# Patient Record
Sex: Male | Born: 1985 | Race: White | Hispanic: No | Marital: Single | Smoking: Former smoker
Health system: Southern US, Community
[De-identification: ages and names within clinical notes are randomized; demographics above are authoritative.]

## PROBLEM LIST (undated history)

## (undated) ENCOUNTER — Emergency Department: Discharge status: Absent without leave

## (undated) DIAGNOSIS — R569 Unspecified convulsions: Secondary | ICD-10-CM

## (undated) DIAGNOSIS — S069X9A Unspecified intracranial injury with loss of consciousness of unspecified duration, initial encounter: Secondary | ICD-10-CM

## (undated) DIAGNOSIS — J45909 Unspecified asthma, uncomplicated: Secondary | ICD-10-CM

## (undated) DIAGNOSIS — S069XAA Unspecified intracranial injury with loss of consciousness status unknown, initial encounter: Secondary | ICD-10-CM

## (undated) DIAGNOSIS — Z5189 Encounter for other specified aftercare: Secondary | ICD-10-CM

## (undated) DIAGNOSIS — T7840XA Allergy, unspecified, initial encounter: Secondary | ICD-10-CM

## (undated) DIAGNOSIS — F840 Autistic disorder: Secondary | ICD-10-CM

## (undated) HISTORY — DX: Allergy, unspecified, initial encounter: T78.40XA

## (undated) HISTORY — PX: ANKLE SURGERY: SHX546

## (undated) HISTORY — DX: Unspecified asthma, uncomplicated: J45.909

## (undated) HISTORY — PX: APPENDECTOMY: SHX54

## (undated) HISTORY — PX: LUNG SURGERY: SHX703

## (undated) HISTORY — PX: BRAIN SURGERY: SHX531

## (undated) HISTORY — DX: Encounter for other specified aftercare: Z51.89

---

## 2020-04-07 ENCOUNTER — Emergency Department
Admission: EM | Admit: 2020-04-07 | Discharge: 2020-04-07 | Disposition: A | Discharge status: Home / Self Care | Payer: Medicare Other | Attending: Student in an Organized Health Care Education/Training Program | Admitting: Student in an Organized Health Care Education/Training Program

## 2020-04-07 ENCOUNTER — Encounter: Payer: Self-pay | Admitting: Emergency Medicine

## 2020-04-07 ENCOUNTER — Emergency Department: Payer: Medicare Other

## 2020-04-07 ENCOUNTER — Other Ambulatory Visit: Payer: Self-pay

## 2020-04-07 DIAGNOSIS — F17200 Nicotine dependence, unspecified, uncomplicated: Secondary | ICD-10-CM | POA: Diagnosis not present

## 2020-04-07 DIAGNOSIS — Y92199 Unspecified place in other specified residential institution as the place of occurrence of the external cause: Secondary | ICD-10-CM | POA: Diagnosis not present

## 2020-04-07 DIAGNOSIS — Y9389 Activity, other specified: Secondary | ICD-10-CM | POA: Diagnosis not present

## 2020-04-07 DIAGNOSIS — S0990XA Unspecified injury of head, initial encounter: Secondary | ICD-10-CM | POA: Insufficient documentation

## 2020-04-07 DIAGNOSIS — W228XXA Striking against or struck by other objects, initial encounter: Secondary | ICD-10-CM | POA: Insufficient documentation

## 2020-04-07 DIAGNOSIS — R569 Unspecified convulsions: Secondary | ICD-10-CM | POA: Insufficient documentation

## 2020-04-07 HISTORY — DX: Unspecified intracranial injury with loss of consciousness status unknown, initial encounter: S06.9XAA

## 2020-04-07 HISTORY — DX: Unspecified intracranial injury with loss of consciousness of unspecified duration, initial encounter: S06.9X9A

## 2020-04-07 HISTORY — DX: Unspecified convulsions: R56.9

## 2020-04-07 LAB — CBG MONITORING, ED: Glucose-Capillary: 111 mg/dL — ABNORMAL HIGH (ref 70–99)

## 2020-04-07 MED ORDER — DIVALPROEX SODIUM 500 MG PO DR TAB
750.0000 mg | DELAYED_RELEASE_TABLET | Freq: Once | ORAL | Status: AC
  Administered 2020-04-07: 750 mg via ORAL
  Filled 2020-04-07: qty 1
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
## 2020-04-07 NOTE — ED Triage Notes (Signed)
Patient brought in by ems from a group home. Patient became angry and then unresponsive. Per ems when they arrived the patient was having eye movement, jaw clinching and unresponsive. Patient has a history of seizures. Patient was given 2 mg of Versed IV by ems at 21:47.
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
## 2020-04-18 ENCOUNTER — Emergency Department
Admission: EM | Admit: 2020-04-18 | Discharge: 2020-04-18 | Disposition: A | Discharge status: Home / Self Care | Payer: Medicare Other | Attending: Emergency Medicine | Admitting: Emergency Medicine

## 2020-04-18 ENCOUNTER — Other Ambulatory Visit: Payer: Self-pay

## 2020-04-18 DIAGNOSIS — R531 Weakness: Secondary | ICD-10-CM | POA: Insufficient documentation

## 2020-04-18 DIAGNOSIS — R519 Headache, unspecified: Secondary | ICD-10-CM | POA: Diagnosis not present

## 2020-04-18 DIAGNOSIS — Z5321 Procedure and treatment not carried out due to patient leaving prior to being seen by health care provider: Secondary | ICD-10-CM | POA: Insufficient documentation

## 2020-04-18 LAB — CBC
HCT: 42.4 % (ref 39.0–52.0)
Hemoglobin: 14.5 g/dL (ref 13.0–17.0)
MCH: 32.2 pg (ref 26.0–34.0)
MCHC: 34.2 g/dL (ref 30.0–36.0)
MCV: 94.2 fL (ref 80.0–100.0)
Platelets: 333 10*3/uL (ref 150–400)
RBC: 4.5 MIL/uL (ref 4.22–5.81)
RDW: 12.9 % (ref 11.5–15.5)
WBC: 8.5 10*3/uL (ref 4.0–10.5)
nRBC: 0 % (ref 0.0–0.2)

## 2020-04-18 LAB — BASIC METABOLIC PANEL
Anion gap: 11 (ref 5–15)
BUN: 13 mg/dL (ref 6–20)
CO2: 24 mmol/L (ref 22–32)
Calcium: 9.4 mg/dL (ref 8.9–10.3)
Chloride: 105 mmol/L (ref 98–111)
Creatinine, Ser: 0.83 mg/dL (ref 0.61–1.24)
GFR, Estimated: >60 mL/min (ref >60)
Glucose, Bld: 90 mg/dL (ref 70–99)
Potassium: 4.1 mmol/L (ref 3.5–5.1)
Sodium: 140 mmol/L (ref 135–145)
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
## 2020-06-05 ENCOUNTER — Emergency Department: Payer: Medicare Other

## 2020-06-05 ENCOUNTER — Other Ambulatory Visit: Payer: Self-pay

## 2020-06-05 DIAGNOSIS — Y9389 Activity, other specified: Secondary | ICD-10-CM | POA: Insufficient documentation

## 2020-06-05 DIAGNOSIS — Y9289 Other specified places as the place of occurrence of the external cause: Secondary | ICD-10-CM | POA: Insufficient documentation

## 2020-06-05 DIAGNOSIS — R45851 Suicidal ideations: Secondary | ICD-10-CM | POA: Insufficient documentation

## 2020-06-05 DIAGNOSIS — R569 Unspecified convulsions: Secondary | ICD-10-CM | POA: Diagnosis not present

## 2020-06-05 DIAGNOSIS — F17200 Nicotine dependence, unspecified, uncomplicated: Secondary | ICD-10-CM | POA: Diagnosis not present

## 2020-06-05 DIAGNOSIS — R531 Weakness: Secondary | ICD-10-CM | POA: Diagnosis not present

## 2020-06-05 DIAGNOSIS — S0990XA Unspecified injury of head, initial encounter: Secondary | ICD-10-CM | POA: Diagnosis not present

## 2020-06-05 DIAGNOSIS — W500XXA Accidental hit or strike by another person, initial encounter: Secondary | ICD-10-CM | POA: Diagnosis not present

## 2020-06-05 DIAGNOSIS — R519 Headache, unspecified: Secondary | ICD-10-CM | POA: Insufficient documentation

## 2020-06-05 DIAGNOSIS — Z87820 Personal history of traumatic brain injury: Secondary | ICD-10-CM | POA: Diagnosis not present

## 2020-06-05 DIAGNOSIS — Y999 Unspecified external cause status: Secondary | ICD-10-CM | POA: Diagnosis not present

## 2020-06-05 DIAGNOSIS — R456 Violent behavior: Secondary | ICD-10-CM | POA: Insufficient documentation

## 2020-06-05 DIAGNOSIS — S069X9S Unspecified intracranial injury with loss of consciousness of unspecified duration, sequela: Secondary | ICD-10-CM | POA: Diagnosis not present

## 2020-06-05 LAB — COMPREHENSIVE METABOLIC PANEL
ALT: 21 U/L (ref 0–44)
AST: 26 U/L (ref 15–41)
Albumin: 4.4 g/dL (ref 3.5–5.0)
Alkaline Phosphatase: 59 U/L (ref 38–126)
Anion gap: 11 (ref 5–15)
BUN: 10 mg/dL (ref 6–20)
CO2: 23 mmol/L (ref 22–32)
Calcium: 9.2 mg/dL (ref 8.9–10.3)
Chloride: 107 mmol/L (ref 98–111)
Creatinine, Ser: 0.9 mg/dL (ref 0.61–1.24)
GFR, Estimated: >60 mL/min (ref >60)
Glucose, Bld: 89 mg/dL (ref 70–99)
Potassium: 3.9 mmol/L (ref 3.5–5.1)
Sodium: 141 mmol/L (ref 135–145)
Total Bilirubin: 0.6 mg/dL (ref 0.3–1.2)
Total Protein: 7.4 g/dL (ref 6.5–8.1)

## 2020-06-05 LAB — URINALYSIS, COMPLETE (UACMP) WITH MICROSCOPIC
Bacteria, UA: NONE SEEN
Bilirubin Urine: NEGATIVE
Glucose, UA: NEGATIVE mg/dL
Hgb urine dipstick: NEGATIVE
Ketones, ur: NEGATIVE mg/dL
Leukocytes,Ua: NEGATIVE
Nitrite: NEGATIVE
Protein, ur: NEGATIVE mg/dL
Specific Gravity, Urine: 1.017 (ref 1.005–1.030)
pH: 6 (ref 5.0–8.0)

## 2020-06-05 LAB — CBC
HCT: 40.2 % (ref 39.0–52.0)
Hemoglobin: 13.5 g/dL (ref 13.0–17.0)
MCH: 31.5 pg (ref 26.0–34.0)
MCHC: 33.6 g/dL (ref 30.0–36.0)
MCV: 93.9 fL (ref 80.0–100.0)
Platelets: 228 10*3/uL (ref 150–400)
RBC: 4.28 MIL/uL (ref 4.22–5.81)
RDW: 12.7 % (ref 11.5–15.5)
WBC: 7.8 10*3/uL (ref 4.0–10.5)
nRBC: 0 % (ref 0.0–0.2)

## 2020-06-05 MED ORDER — ACETAMINOPHEN 325 MG PO TABS
ORAL_TABLET | ORAL | Status: AC
  Administered 2020-06-05: 1000 mg via ORAL
  Filled 2020-06-05: qty 2

## 2020-06-05 MED ORDER — ACETAMINOPHEN 500 MG PO TABS: 1000.0000 mg | ORAL_TABLET | Freq: Once | ORAL | Status: AC

## 2020-06-05 NOTE — ED Triage Notes (Addendum)
Pt brought in by care home staff from [facility]. Pt has hx of TBI and he feels like he might have had a stroke this am. Pt states he has pain to head, throat, and tongue. Pt co generalized weakness. Pt unable to speak due to TBI and communicates by writting. Care taker present at this time. Pt has no deficits at this time.

## 2020-06-05 NOTE — ED Notes (Addendum)
Pt ambulatory in lobby without difficulty or distress; pt to STAT desk to inquire over wait time; pt with clear speech and very talkative at this time

## 2020-06-06 ENCOUNTER — Other Ambulatory Visit: Payer: Self-pay

## 2020-06-06 ENCOUNTER — Encounter: Payer: Self-pay | Admitting: Emergency Medicine

## 2020-06-06 ENCOUNTER — Emergency Department (EMERGENCY_DEPARTMENT_HOSPITAL)
Admission: EM | Admit: 2020-06-06 | Discharge: 2020-06-06 | Disposition: A | Discharge status: Home / Self Care | Payer: Medicare Other | Source: Home / Self Care | Attending: Emergency Medicine | Admitting: Emergency Medicine

## 2020-06-06 ENCOUNTER — Emergency Department
Admission: EM | Admit: 2020-06-06 | Discharge: 2020-06-06 | Disposition: A | Discharge status: Home / Self Care | Payer: Medicare Other | Attending: Emergency Medicine | Admitting: Emergency Medicine

## 2020-06-06 DIAGNOSIS — R456 Violent behavior: Secondary | ICD-10-CM | POA: Insufficient documentation

## 2020-06-06 DIAGNOSIS — R519 Headache, unspecified: Secondary | ICD-10-CM | POA: Diagnosis not present

## 2020-06-06 DIAGNOSIS — F17200 Nicotine dependence, unspecified, uncomplicated: Secondary | ICD-10-CM | POA: Insufficient documentation

## 2020-06-06 DIAGNOSIS — S069X9S Unspecified intracranial injury with loss of consciousness of unspecified duration, sequela: Secondary | ICD-10-CM

## 2020-06-06 DIAGNOSIS — S069XAA Unspecified intracranial injury with loss of consciousness status unknown, initial encounter: Secondary | ICD-10-CM

## 2020-06-06 DIAGNOSIS — R4689 Other symptoms and signs involving appearance and behavior: Secondary | ICD-10-CM

## 2020-06-06 DIAGNOSIS — S0990XA Unspecified injury of head, initial encounter: Secondary | ICD-10-CM

## 2020-06-06 DIAGNOSIS — X58XXXA Exposure to other specified factors, initial encounter: Secondary | ICD-10-CM | POA: Insufficient documentation

## 2020-06-06 DIAGNOSIS — S069X9A Unspecified intracranial injury with loss of consciousness of unspecified duration, initial encounter: Secondary | ICD-10-CM

## 2020-06-06 DIAGNOSIS — W19XXXA Unspecified fall, initial encounter: Secondary | ICD-10-CM

## 2020-06-06 LAB — CBC
HCT: 41.6 % (ref 39.0–52.0)
Hemoglobin: 13.9 g/dL (ref 13.0–17.0)
MCH: 31.5 pg (ref 26.0–34.0)
MCHC: 33.4 g/dL (ref 30.0–36.0)
MCV: 94.3 fL (ref 80.0–100.0)
Platelets: 237 10*3/uL (ref 150–400)
RBC: 4.41 MIL/uL (ref 4.22–5.81)
RDW: 12.6 % (ref 11.5–15.5)
WBC: 7.8 10*3/uL (ref 4.0–10.5)
nRBC: 0 % (ref 0.0–0.2)

## 2020-06-06 LAB — URINE DRUG SCREEN, QUALITATIVE (ARMC ONLY)
Amphetamines, Ur Screen: NOT DETECTED
Barbiturates, Ur Screen: NOT DETECTED
Benzodiazepine, Ur Scrn: NOT DETECTED
Cannabinoid 50 Ng, Ur ~~LOC~~: NOT DETECTED
Cocaine Metabolite,Ur ~~LOC~~: NOT DETECTED
MDMA (Ecstasy)Ur Screen: NOT DETECTED
Methadone Scn, Ur: NOT DETECTED
Opiate, Ur Screen: NOT DETECTED
Phencyclidine (PCP) Ur S: NOT DETECTED
Tricyclic, Ur Screen: NOT DETECTED

## 2020-06-06 LAB — COMPREHENSIVE METABOLIC PANEL
ALT: 24 U/L (ref 0–44)
AST: 28 U/L (ref 15–41)
Albumin: 4.2 g/dL (ref 3.5–5.0)
Alkaline Phosphatase: 59 U/L (ref 38–126)
Anion gap: 12 (ref 5–15)
BUN: 13 mg/dL (ref 6–20)
CO2: 24 mmol/L (ref 22–32)
Calcium: 9.3 mg/dL (ref 8.9–10.3)
Chloride: 104 mmol/L (ref 98–111)
Creatinine, Ser: 0.89 mg/dL (ref 0.61–1.24)
GFR, Estimated: >60 mL/min (ref >60)
Glucose, Bld: 92 mg/dL (ref 70–99)
Potassium: 3.8 mmol/L (ref 3.5–5.1)
Sodium: 140 mmol/L (ref 135–145)
Total Bilirubin: 0.7 mg/dL (ref 0.3–1.2)
Total Protein: 7.6 g/dL (ref 6.5–8.1)

## 2020-06-06 LAB — ETHANOL: Alcohol, Ethyl (B): <10 mg/dL (ref <10)

## 2020-06-06 LAB — TROPONIN I (HIGH SENSITIVITY): Troponin I (High Sensitivity): <2 ng/L (ref <18)

## 2020-06-06 LAB — SALICYLATE LEVEL: Salicylate Lvl: <7 mg/dL — ABNORMAL LOW (ref 7.0–30.0)

## 2020-06-06 LAB — ACETAMINOPHEN LEVEL: Acetaminophen (Tylenol), Serum: <10 ug/mL — ABNORMAL LOW (ref 10–30)

## 2020-06-06 MED ORDER — NICOTINE 14 MG/24HR TD PT24: 14.0000 mg | MEDICATED_PATCH | TRANSDERMAL | 0 refills | Status: AC
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
## 2020-06-06 NOTE — ED Notes (Signed)
IVC/Pending Consult 
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
## 2020-06-06 NOTE — ED Triage Notes (Signed)
First Nurse Note:  Arrives with BPD under IVF paperwork for ED evaluation.  Per LE, patient arrives from group home.  Patient awake and alert.  Calm and cooperative at this time.
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
## 2020-06-06 NOTE — ED Notes (Addendum)
Patient's belongings: Pair slippers, pair black socks, pair pants, pair of underwear, black tshirt, grey jacket, pair of grey sweat pants, black toboggan, black watch.

## 2020-06-06 NOTE — ED Notes (Signed)
VOLUNTARY came in as IVC and Dr Toni Amend down to rescind status will d/c home

## 2020-06-06 NOTE — Discharge Instructions (Signed)
Take your medicines daily as directed by your doctor.  Return to the ER for worsening symptoms, persistent vomiting, difficulty breathing, lethargy or other concerns. 
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
## 2020-06-16 DIAGNOSIS — F39 Unspecified mood [affective] disorder: Secondary | ICD-10-CM | POA: Insufficient documentation

## 2020-06-16 DIAGNOSIS — R569 Unspecified convulsions: Secondary | ICD-10-CM | POA: Insufficient documentation

## 2020-11-02 ENCOUNTER — Other Ambulatory Visit: Payer: Self-pay

## 2020-11-02 ENCOUNTER — Emergency Department
Admission: EM | Admit: 2020-11-02 | Discharge: 2020-11-02 | Disposition: A | Discharge status: Home / Self Care | Payer: Medicare Other | Attending: Emergency Medicine | Admitting: Emergency Medicine

## 2020-11-02 ENCOUNTER — Encounter: Payer: Self-pay | Admitting: Emergency Medicine

## 2020-11-02 DIAGNOSIS — R569 Unspecified convulsions: Secondary | ICD-10-CM | POA: Insufficient documentation

## 2020-11-02 DIAGNOSIS — F17210 Nicotine dependence, cigarettes, uncomplicated: Secondary | ICD-10-CM | POA: Diagnosis not present

## 2020-11-02 DIAGNOSIS — G40919 Epilepsy, unspecified, intractable, without status epilepticus: Secondary | ICD-10-CM

## 2020-11-02 LAB — BASIC METABOLIC PANEL
Anion gap: 9 (ref 5–15)
BUN: 14 mg/dL (ref 6–20)
CO2: 27 mmol/L (ref 22–32)
Calcium: 9.3 mg/dL (ref 8.9–10.3)
Chloride: 104 mmol/L (ref 98–111)
Creatinine, Ser: 0.83 mg/dL (ref 0.61–1.24)
GFR, Estimated: >60 mL/min (ref >60)
Glucose, Bld: 90 mg/dL (ref 70–99)
Potassium: 3.9 mmol/L (ref 3.5–5.1)
Sodium: 140 mmol/L (ref 135–145)

## 2020-11-02 LAB — CBC
HCT: 43 % (ref 39.0–52.0)
Hemoglobin: 14.7 g/dL (ref 13.0–17.0)
MCH: 32.6 pg (ref 26.0–34.0)
MCHC: 34.2 g/dL (ref 30.0–36.0)
MCV: 95.3 fL (ref 80.0–100.0)
Platelets: 272 10*3/uL (ref 150–400)
RBC: 4.51 MIL/uL (ref 4.22–5.81)
RDW: 12 % (ref 11.5–15.5)
WBC: 10.8 10*3/uL — ABNORMAL HIGH (ref 4.0–10.5)
nRBC: 0 % (ref 0.0–0.2)

## 2020-11-02 LAB — URINALYSIS, COMPLETE (UACMP) WITH MICROSCOPIC
Bilirubin Urine: NEGATIVE
Glucose, UA: NEGATIVE mg/dL
Hgb urine dipstick: NEGATIVE
Ketones, ur: NEGATIVE mg/dL
Leukocytes,Ua: NEGATIVE
Nitrite: NEGATIVE
Protein, ur: NEGATIVE mg/dL
Specific Gravity, Urine: 1.002 — ABNORMAL LOW (ref 1.005–1.030)
Squamous Epithelial / HPF: NONE SEEN (ref 0–5)
WBC, UA: NONE SEEN WBC/hpf (ref 0–5)
pH: 7 (ref 5.0–8.0)

## 2020-11-02 LAB — URINE DRUG SCREEN, QUALITATIVE (ARMC ONLY)
Amphetamines, Ur Screen: NOT DETECTED
Barbiturates, Ur Screen: NOT DETECTED
Benzodiazepine, Ur Scrn: NOT DETECTED
Cannabinoid 50 Ng, Ur ~~LOC~~: NOT DETECTED
Cocaine Metabolite,Ur ~~LOC~~: NOT DETECTED
MDMA (Ecstasy)Ur Screen: NOT DETECTED
Methadone Scn, Ur: NOT DETECTED
Opiate, Ur Screen: NOT DETECTED
Phencyclidine (PCP) Ur S: NOT DETECTED
Tricyclic, Ur Screen: NOT DETECTED

## 2020-11-02 MED ORDER — DIVALPROEX SODIUM 500 MG PO DR TAB: 500.0000 mg | DELAYED_RELEASE_TABLET | Freq: Three times a day (TID) | ORAL | 0 refills | Status: DC

## 2020-11-02 NOTE — ED Triage Notes (Signed)
Pt comes into the ED via POV with his group home c/o seizures last night with a 7 minutes duration and 5 minutes postictal time.  Pt ambulatory to triage at this time.  Pt does admit that this is his 4th seizure this week and he has been taking his medications correctly.  Pt in NAD at this time with even and unlabored respirations.
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
## 2020-11-02 NOTE — ED Notes (Signed)
Helped pt up to toilet for BM. Steady gait noted.

## 2020-11-22 ENCOUNTER — Emergency Department: Payer: Medicare Other

## 2020-11-22 ENCOUNTER — Other Ambulatory Visit: Payer: Self-pay

## 2020-11-22 ENCOUNTER — Emergency Department
Admission: EM | Admit: 2020-11-22 | Discharge: 2020-11-22 | Disposition: A | Discharge status: Home / Self Care | Payer: Medicare Other | Attending: Emergency Medicine | Admitting: Emergency Medicine

## 2020-11-22 DIAGNOSIS — F17200 Nicotine dependence, unspecified, uncomplicated: Secondary | ICD-10-CM | POA: Insufficient documentation

## 2020-11-22 DIAGNOSIS — R55 Syncope and collapse: Secondary | ICD-10-CM

## 2020-11-22 LAB — URINALYSIS, COMPLETE (UACMP) WITH MICROSCOPIC
Bacteria, UA: NONE SEEN
Bilirubin Urine: NEGATIVE
Glucose, UA: NEGATIVE mg/dL
Hgb urine dipstick: NEGATIVE
Ketones, ur: NEGATIVE mg/dL
Leukocytes,Ua: NEGATIVE
Nitrite: NEGATIVE
Protein, ur: NEGATIVE mg/dL
Specific Gravity, Urine: 1.024 (ref 1.005–1.030)
pH: 6 (ref 5.0–8.0)

## 2020-11-22 LAB — BASIC METABOLIC PANEL
Anion gap: 10 (ref 5–15)
BUN: 12 mg/dL (ref 6–20)
CO2: 25 mmol/L (ref 22–32)
Calcium: 9 mg/dL (ref 8.9–10.3)
Chloride: 103 mmol/L (ref 98–111)
Creatinine, Ser: 0.79 mg/dL (ref 0.61–1.24)
GFR, Estimated: >60 mL/min (ref >60)
Glucose, Bld: 94 mg/dL (ref 70–99)
Potassium: 3.7 mmol/L (ref 3.5–5.1)
Sodium: 138 mmol/L (ref 135–145)

## 2020-11-22 LAB — CBC
HCT: 39.1 % (ref 39.0–52.0)
Hemoglobin: 13.1 g/dL (ref 13.0–17.0)
MCH: 32.7 pg (ref 26.0–34.0)
MCHC: 33.5 g/dL (ref 30.0–36.0)
MCV: 97.5 fL (ref 80.0–100.0)
Platelets: 335 10*3/uL (ref 150–400)
RBC: 4.01 MIL/uL — ABNORMAL LOW (ref 4.22–5.81)
RDW: 12.1 % (ref 11.5–15.5)
WBC: 10.8 10*3/uL — ABNORMAL HIGH (ref 4.0–10.5)
nRBC: 0 % (ref 0.0–0.2)

## 2020-11-22 LAB — VALPROIC ACID LEVEL: Valproic Acid Lvl: 77 ug/mL (ref 50.0–100.0)

## 2020-11-22 MED ORDER — SODIUM CHLORIDE 0.9 % IV BOLUS
500.0000 mL | Freq: Once | INTRAVENOUS | Status: AC
  Administered 2020-11-22: 500 mL via INTRAVENOUS

## 2020-11-22 MED ORDER — LACOSAMIDE 50 MG PO TABS
200.0000 mg | ORAL_TABLET | Freq: Two times a day (BID) | ORAL | Status: DC
  Administered 2020-11-22: 200 mg via ORAL
  Filled 2020-11-22: qty 4

## 2020-11-22 MED ORDER — DIVALPROEX SODIUM 500 MG PO DR TAB
1000.0000 mg | DELAYED_RELEASE_TABLET | Freq: Once | ORAL | Status: AC
  Administered 2020-11-22: 1000 mg via ORAL
  Filled 2020-11-22: qty 2

## 2020-11-22 NOTE — ED Notes (Signed)
Patient to CT at this time
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
## 2020-11-22 NOTE — Discharge Instructions (Addendum)
Your work-up was reassuring.  Return to the ER if develop any worsening concerns or any other issues

## 2020-11-22 NOTE — ED Triage Notes (Signed)
First Nurse Note:  Arrives via [facility].  Witnessed syncopal episode today.  Per report:  22g right hand, initially 82/43, now 103/62 -- after 100 cc NS.  VS wnl.  12 lead unremarkable.  Only complaint is pain all over.  History of TBI, speech and mentation wnl.

## 2020-11-22 NOTE — ED Provider Notes (Signed)
[facility] Emergency Department Provider Note  ____________________________________________   Event Date/Time   First MD Initiated Contact with Patient 11/22/20 2035     (approximate)  I have reviewed the triage vital signs and the nursing notes.   HISTORY  Chief Complaint Loss of Consciousness    HPI Eddie Berry is a 35 y.o. male with history of seizures, TBI who comes in with concerns for syncopal episode.  Initial blood pressure was 82/43.  Patient's blood pressure after 100 cc of fluid was 103/62.  Patient states that he was outside for 4 hours in the heat and he thinks that he just got over 8 heated and that will cause him to pass out.  Denies missing any medications.  He is not sure if he hit his head.  Denies any urinary incontinence.  He denies being told that there was any seizure activity and the patient group home caregiver that is with him was not the one who saw him pass out so she is not sure.  Did see a note stating that he had talked to his neurologist about him coming to the ER for seizure but from what I can tell nobody states that he has had a seizure.  I did not find the neurology notes of patient's on Vimpat and Depakote.  At this time patient states that he is feeling at his baseline self.          Past Medical History:  Diagnosis Date   Seizures (HCC)    TBI (traumatic brain injury) [facility])     Patient Active Problem List   Diagnosis Date Noted   TBI (traumatic brain injury) [facility])     Past Surgical History:  Procedure Laterality Date   ANKLE SURGERY     APPENDECTOMY     BRAIN SURGERY     LUNG SURGERY      Prior to Admission medications   Medication Sig Start Date End Date Taking? Authorizing Provider  divalproex (DEPAKOTE) 500 MG DR tablet Take 1 tablet (500 mg total) by mouth 3 (three) times daily. 11/02/20 12/02/20  Merwyn Katos, MD  nicotine (NICODERM CQ - DOSED IN MG/24 HOURS) 14 mg/24hr patch Place 1 patch (14 mg  total) onto the skin daily. 06/06/20 06/06/21  Jene Every, MD    Allergies Antihistamines, diphenhydramine-type; Aspirin; Nsaids; and Prednisone  History reviewed. No pertinent family history.  Social History Social History   Tobacco Use   Smoking status: Every Day   Smokeless tobacco: Never  Substance Use Topics   Alcohol use: Not Currently   Drug use: Not Currently      Review of Systems Constitutional: No fever/chills, positive syncope Eyes: No visual changes. ENT: No sore throat. Cardiovascular: Denies chest pain. Respiratory: Denies shortness of breath. Gastrointestinal: No abdominal pain.  No nausea, no vomiting.  No diarrhea.  No constipation. Genitourinary: Negative for dysuria. Musculoskeletal: Negative for back pain. Skin: Negative for rash. Neurological: Negative for headaches, focal weakness or numbness. All other ROS negative ____________________________________________   PHYSICAL EXAM:  VITAL SIGNS: ED Triage Vitals  Enc Vitals Group     BP 11/22/20 1744 108/84     Pulse Rate 11/22/20 1744 100     Resp 11/22/20 1744 18     Temp 11/22/20 1744 98.2 F (36.8 C)     Temp Source 11/22/20 1744 Oral     SpO2 11/22/20 1744 97 %     Weight 11/22/20 1743 232 lb (105.2 kg)  Height 11/22/20 1743 5\' 10"  (1.778 m)     Head Circumference --      Peak Flow --      Pain Score 11/22/20 1743 0     Pain Loc --      Pain Edu? --      Excl. in GC? --     Constitutional: Alert and oriented. Well appearing and in no acute distress. Eyes: Conjunctivae are normal. EOMI. Head: Atraumatic. Nose: No congestion/rhinnorhea. Mouth/Throat: Mucous membranes are moist.   Neck: No stridor. Trachea Midline. FROM Cardiovascular: Normal rate, regular rhythm. Grossly normal heart sounds.  Good peripheral circulation. Respiratory: Normal respiratory effort.  No retractions. Lungs CTAB. Gastrointestinal: Soft and nontender. No distention. No abdominal bruits.   Musculoskeletal: No lower extremity tenderness nor edema.  No joint effusions. Neurologic:  Normal speech and language. No gross focal neurologic deficits are appreciated.  Facial droop noted which is at patient's baseline from prior TBI Skin:  Skin is warm, dry and intact. No rash noted. Psychiatric: Mood and affect are normal. Speech and behavior are normal. GU: Deferred   ____________________________________________   LABS (all labs ordered are listed, but only abnormal results are displayed)  Labs Reviewed  CBC - Abnormal; Notable for the following components:      Result Value   WBC 10.8 (*)    RBC 4.01 (*)    All other components within normal limits  URINALYSIS, COMPLETE (UACMP) WITH MICROSCOPIC - Abnormal; Notable for the following components:   Color, Urine YELLOW (*)    APPearance HAZY (*)    All other components within normal limits  BASIC METABOLIC PANEL  VALPROIC ACID LEVEL  CBG MONITORING, ED   ____________________________________________   ED ECG REPORT I, 11/24/20, the attending physician, personally viewed and interpreted this ECG.  Normal sinus rate of 98, no ST elevation, no T wave inversions, normal intervals ____________________________________________  RADIOLOGY   Official radiology report(s): CT Head Wo Contrast  Result Date: 11/22/2020 CLINICAL DATA:  Nonspecific dizziness.  Syncopal episode today. EXAM: CT HEAD WITHOUT CONTRAST TECHNIQUE: Contiguous axial images were obtained from the base of the skull through the vertex without intravenous contrast. COMPARISON:  Head CT 06/05/2020 FINDINGS: Brain: No hemorrhage. Stable multifocal areas of encephalomalacia with involvement of the left inferior frontal lobe, left temporal lobe, right frontal lobe, and right cerebellum. No significant change from prior exam. No evidence of acute ischemia. There is no subdural collection. No hydrocephalus or midline shift. Vascular: No hyperdense vessel or  unexpected calcification. Skull: No skull fracture. Postsurgical change throughout the left calvarium. Sinuses/Orbits: No acute findings. No mastoid effusion. Metallic density overlying the right globe is chronic, only partially included in the field of view. Other: None. IMPRESSION: 1. No acute intracranial abnormality. 2. Stable multifocal areas of encephalomalacia. Electronically Signed   By: 06/07/2020 M.D.   On: 11/22/2020 21:48   CT Cervical Spine Wo Contrast  Result Date: 11/22/2020 CLINICAL DATA:  Neck trauma, dangerous injury mechanism (Age 15-64y) Syncopal episode today. EXAM: CT CERVICAL SPINE WITHOUT CONTRAST TECHNIQUE: Multidetector CT imaging of the cervical spine was performed without intravenous contrast. Multiplanar CT image reconstructions were also generated. COMPARISON:  Cervical spine CT 06/05/2020 FINDINGS: Alignment: Again seen generalized reversal of normal lordosis. There is no traumatic subluxation. 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 No acute fracture. Soft tissues and spinal canal: No prevertebral fluid or swelling.  No visible canal hematoma. Disc levels:  Disc space narrowing and endplate spurring at C6-C7. Upper chest: Upper esophagus mildly patulous. Similar findings on prior. No acute findings. Other: None. IMPRESSION: 1. No acute fracture or subluxation of the cervical spine. 2. Broad-based reversal of normal lordosis, unchanged. 3. Fusion of C2 and C3, presumably congenital. Electronically Signed   By: Narda Rutherford M.D.   On: 11/22/2020 21:51    ____________________________________________   PROCEDURES  Procedure(s) performed (including Critical Care):  .1-3 Lead EKG Interpretation  Date/Time: 11/22/2020 10:42 PM Performed by: Concha Se, MD Authorized by: Concha Se, MD     Interpretation: normal     ECG rate:  80s   ECG rate assessment:  normal     Rhythm: sinus rhythm     Ectopy: none     Conduction: normal     ____________________________________________   INITIAL IMPRESSION / ASSESSMENT AND PLAN / ED COURSE  Eddie Berry was evaluated in Emergency Department on 11/22/2020 for the symptoms described in the history of present illness. He was evaluated in the context of the global COVID-19 pandemic, which necessitated consideration that the patient might be at risk for infection with the SARS-CoV-2 virus that causes COVID-19. Institutional protocols and algorithms that pertain to the evaluation of patients at risk for COVID-19 are in a state of rapid change based on information released by regulatory bodies including the CDC and federal and state organizations. These policies and algorithms were followed during the patient's care in the ED.     Patient comes in with syncope.  Denies that he urinated on himself or that he had a seizure.  No report from facility that it was a seizure but it could have been a breakthrough seizure however, I suspect it is related to heat exhaustion given he reports being really hot and being outside for multiple hours and he was initially hypotensive could have been dehydrated.  Patient was given fluids.  Labs ordered evaluate for Electra abnormalities, AKI, UTI, CT imaging to evaluate for intercranial hemorrhage, cervical fracture.  EKG ordered evaluate for arrhythmia patient be kept on the cardiac monitor.  UA without evidence of UTI.  Labs do not show any signs of severe dehydration.   Labs are reassuring.  Blood pressures remained stable.  Patient is requesting discharge home.  Patient was given his home dose of seizure medicine  I discussed the provisional nature of ED diagnosis, the treatment so far, the ongoing plan of care, follow up appointments and return precautions with the patient and any family or support people present. They expressed understanding and agreed with the plan,  discharged home.         ____________________________________________   FINAL CLINICAL IMPRESSION(S) / ED DIAGNOSES   Final diagnoses:  Syncope and collapse      MEDICATIONS GIVEN DURING THIS VISIT:  Medications  lacosamide (VIMPAT) tablet 200 mg (200 mg Oral Given 11/22/20 2156)  sodium chloride 0.9 % bolus 500 mL (500 mLs Intravenous New Bag/Given 11/22/20 2202)  divalproex (DEPAKOTE) DR tablet 1,000 mg (1,000 mg Oral Given 11/22/20 2157)     ED Discharge Orders     None        Note:  This document was prepared using Dragon voice recognition software and may include unintentional dictation errors.    Concha Se, MD 11/22/20 [phone]

## 2021-06-27 ENCOUNTER — Other Ambulatory Visit: Payer: Self-pay

## 2021-06-27 ENCOUNTER — Encounter: Payer: Self-pay | Admitting: *Deleted

## 2021-06-27 ENCOUNTER — Emergency Department: Payer: Medicare Other

## 2021-06-27 ENCOUNTER — Emergency Department
Admission: EM | Admit: 2021-06-27 | Discharge: 2021-06-27 | Disposition: A | Discharge status: Home / Self Care | Payer: Medicare Other | Attending: Emergency Medicine | Admitting: Emergency Medicine

## 2021-06-27 DIAGNOSIS — G40909 Epilepsy, unspecified, not intractable, without status epilepticus: Secondary | ICD-10-CM | POA: Diagnosis not present

## 2021-06-27 DIAGNOSIS — M79632 Pain in left forearm: Secondary | ICD-10-CM | POA: Insufficient documentation

## 2021-06-27 DIAGNOSIS — R569 Unspecified convulsions: Secondary | ICD-10-CM

## 2021-06-27 DIAGNOSIS — W1830XA Fall on same level, unspecified, initial encounter: Secondary | ICD-10-CM | POA: Diagnosis not present

## 2021-06-27 LAB — CBC WITH DIFFERENTIAL/PLATELET
Abs Immature Granulocytes: 0.02 10*3/uL (ref 0.00–0.07)
Basophils Absolute: 0 10*3/uL (ref 0.0–0.1)
Basophils Relative: 1 %
Eosinophils Absolute: 0.2 10*3/uL (ref 0.0–0.5)
Eosinophils Relative: 2 %
HCT: 43.1 % (ref 39.0–52.0)
Hemoglobin: 13.9 g/dL (ref 13.0–17.0)
Immature Granulocytes: 0 %
Lymphocytes Relative: 37 %
Lymphs Abs: 2.5 10*3/uL (ref 0.7–4.0)
MCH: 31.3 pg (ref 26.0–34.0)
MCHC: 32.3 g/dL (ref 30.0–36.0)
MCV: 97.1 fL (ref 80.0–100.0)
Monocytes Absolute: 0.6 10*3/uL (ref 0.1–1.0)
Monocytes Relative: 9 %
Neutro Abs: 3.5 10*3/uL (ref 1.7–7.7)
Neutrophils Relative %: 51 %
Platelets: 254 10*3/uL (ref 150–400)
RBC: 4.44 MIL/uL (ref 4.22–5.81)
RDW: 12.5 % (ref 11.5–15.5)
WBC: 6.8 10*3/uL (ref 4.0–10.5)
nRBC: 0 % (ref 0.0–0.2)

## 2021-06-27 LAB — URINALYSIS, COMPLETE (UACMP) WITH MICROSCOPIC
Bacteria, UA: NONE SEEN
Bilirubin Urine: NEGATIVE
Glucose, UA: NEGATIVE mg/dL
Hgb urine dipstick: NEGATIVE
Ketones, ur: NEGATIVE mg/dL
Leukocytes,Ua: NEGATIVE
Nitrite: NEGATIVE
Protein, ur: NEGATIVE mg/dL
Specific Gravity, Urine: 1.006 (ref 1.005–1.030)
Squamous Epithelial / HPF: NONE SEEN (ref 0–5)
pH: 6 (ref 5.0–8.0)

## 2021-06-27 LAB — COMPREHENSIVE METABOLIC PANEL
ALT: 21 U/L (ref 0–44)
AST: 23 U/L (ref 15–41)
Albumin: 4.3 g/dL (ref 3.5–5.0)
Alkaline Phosphatase: 52 U/L (ref 38–126)
Anion gap: 8 (ref 5–15)
BUN: 10 mg/dL (ref 6–20)
CO2: 29 mmol/L (ref 22–32)
Calcium: 9.3 mg/dL (ref 8.9–10.3)
Chloride: 104 mmol/L (ref 98–111)
Creatinine, Ser: 0.96 mg/dL (ref 0.61–1.24)
GFR, Estimated: >60 mL/min (ref >60)
Glucose, Bld: 91 mg/dL (ref 70–99)
Potassium: 4.1 mmol/L (ref 3.5–5.1)
Sodium: 141 mmol/L (ref 135–145)
Total Bilirubin: 0.4 mg/dL (ref 0.3–1.2)
Total Protein: 7.7 g/dL (ref 6.5–8.1)

## 2021-06-27 LAB — VALPROIC ACID LEVEL: Valproic Acid Lvl: 68 ug/mL (ref 50.0–100.0)

## 2021-06-27 NOTE — ED Notes (Signed)
Wandering hallway, speaking with EDP in h/w, redirected back to room, wants to leave, plan explained with rationale.

## 2021-06-27 NOTE — ED Notes (Signed)
EDP at BS 

## 2021-06-27 NOTE — ED Triage Notes (Addendum)
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 Arrives with ice pack to FA.

## 2021-06-27 NOTE — ED Notes (Signed)
Delay in d/c d/t other critical pts 

## 2021-06-27 NOTE — ED Notes (Signed)
Given soda per request. Pending CT. Labs sent.

## 2021-06-27 NOTE — ED Notes (Signed)
Sitting in w/c in room, group home staff at [facility]

## 2021-06-27 NOTE — ED Provider Notes (Signed)
[facility] Provider Note    Event Date/Time   First MD Initiated Contact with Patient 06/27/21 1250     (approximate)  History   Chief Complaint: Seizures  HPI  Eddie Berry is a 36 y.o. male with a past medical history of TBI, seizure disorder, presents to the emergency department for likely seizure.  According to report they believe the patient had a seizure and fell to the ground.  Is not clear if he hit his head.  His only complaint is left forearm pain where he believes he struck it on a dresser.  Here the patient appears well, no distress.  His only pain complaint is left forearm pain.  He is asking for a soda to drink.  Physical Exam   Triage Vital Signs: ED Triage Vitals  Enc Vitals Group     BP 06/27/21 1250 116/79     Pulse Rate 06/27/21 1255 98     Resp --      Temp --      Temp src --      SpO2 06/27/21 1253 97 %     Weight 06/27/21 1257 232 lb (105.2 kg)     Height 06/27/21 1257 6' (1.829 m)     Head Circumference --      Peak Flow --      Pain Score 06/27/21 1257 7     Pain Loc --      Pain Edu? --      Excl. in GC? --     Most recent vital signs: Vitals:   06/27/21 1253 06/27/21 1255  BP:    Pulse:  98  SpO2: 97% 96%    General: Awake, no distress.  CV:  Good peripheral perfusion.  Regular rate and rhythm  Resp:  Normal effort.  Equal breath sounds bilaterally.  Abd:  No distention.  Soft, nontender.  No rebound or guarding. Other:  Mild tenderness to palpation of the left mid forearm.  No deformity noted.   ED Results / Procedures / Treatments   RADIOLOGY  Refusing x-ray  I have personally reviewed the CT images, no acute abnormality on my evaluation. Multiple old infarcts.  No acute abnormality.   MEDICATIONS ORDERED IN ED: Medications - No data to display   IMPRESSION / MDM / ASSESSMENT AND PLAN / ED COURSE  I reviewed the triage vital signs and the nursing notes.  Patient presents to the emergency  department after likely seizure at his group facility.  Possibly hit his head he is not sure.  Patient is complaining of left forearm pain to the mid forearm.  There is no obvious hematoma or deformity.  We will obtain an x-ray as a precaution.  As the patient likely had a seizure and he is not sure if he hit his head we will obtain CT imaging of the head as a precaution.  We will check labs and continue to closely monitor.  Overall the patient appears well, no distress.  He states his last seizure was approximately 1 week ago.  Patient allowed [place] to get lab work.  States he now wants to leave.  He is refusing x-ray and CT imaging.  There is no signs of head trauma on examination.  There is no sign of trauma to the left upper extremity.  Patient is using left arm without issue.  We will cancel the x-ray and CT imaging.  We will await lab work results.  Patient continues to state  he wants to leave but he is redirectable I was able to go back in the room.  Patient's work-up is reassuring.  Lab work shows no significant abnormality.  No CBC.  Normal chemistry.  Normal urinalysis.  Valproic acid level appears to be in therapeutic range patient is awake alert he has no distress.  He is asking for soda he is asking for food.  We will discharge patient back to his group facility.  FINAL CLINICAL IMPRESSION(S) / ED DIAGNOSES   Seizure    Note:  This document was prepared using Dragon voice recognition software and may include unintentional dictation errors.   Minna Antis, MD 06/27/21 1420

## 2021-06-28 LAB — LAMOTRIGINE LEVEL: Lamotrigine Lvl: <1 ug/mL — ABNORMAL LOW (ref 2.0–20.0)

## 2021-07-04 ENCOUNTER — Emergency Department: Payer: Medicare Other

## 2021-07-04 ENCOUNTER — Other Ambulatory Visit: Payer: Self-pay

## 2021-07-04 ENCOUNTER — Emergency Department
Admission: EM | Admit: 2021-07-04 | Discharge: 2021-07-04 | Disposition: A | Discharge status: Home / Self Care | Payer: Medicare Other | Attending: Emergency Medicine | Admitting: Emergency Medicine

## 2021-07-04 DIAGNOSIS — G40909 Epilepsy, unspecified, not intractable, without status epilepticus: Secondary | ICD-10-CM | POA: Diagnosis not present

## 2021-07-04 DIAGNOSIS — R569 Unspecified convulsions: Secondary | ICD-10-CM | POA: Diagnosis present

## 2021-07-04 LAB — BASIC METABOLIC PANEL
Anion gap: 12 (ref 5–15)
BUN: 9 mg/dL (ref 6–20)
CO2: 24 mmol/L (ref 22–32)
Calcium: 9.3 mg/dL (ref 8.9–10.3)
Chloride: 104 mmol/L (ref 98–111)
Creatinine, Ser: 0.8 mg/dL (ref 0.61–1.24)
GFR, Estimated: >60 mL/min (ref >60)
Glucose, Bld: 93 mg/dL (ref 70–99)
Potassium: 4.2 mmol/L (ref 3.5–5.1)
Sodium: 140 mmol/L (ref 135–145)

## 2021-07-04 LAB — CBC
HCT: 42.9 % (ref 39.0–52.0)
Hemoglobin: 14.1 g/dL (ref 13.0–17.0)
MCH: 31.5 pg (ref 26.0–34.0)
MCHC: 32.9 g/dL (ref 30.0–36.0)
MCV: 95.8 fL (ref 80.0–100.0)
Platelets: 262 10*3/uL (ref 150–400)
RBC: 4.48 MIL/uL (ref 4.22–5.81)
RDW: 12.5 % (ref 11.5–15.5)
WBC: 7.6 10*3/uL (ref 4.0–10.5)
nRBC: 0 % (ref 0.0–0.2)

## 2021-07-04 LAB — VALPROIC ACID LEVEL: Valproic Acid Lvl: 96 ug/mL (ref 50.0–100.0)

## 2021-07-04 MED ORDER — LACOSAMIDE 200 MG PO TABS: 200.0000 mg | ORAL_TABLET | Freq: Two times a day (BID) | ORAL | 2 refills | Status: DC

## 2021-07-04 NOTE — ED Provider Notes (Signed)
? ?[facility] ?Provider Note ? ? ? Event Date/Time  ? First MD Initiated Contact with Patient 07/04/21 1503   ?  (approximate) ? ?History  ? ?Chief Complaint: Seizures ? ?HPI ? ?Eddie Berry is a 36 y.o. male with a past medical history of seizure disorder, traumatic brain injury, presents to the emergency department after seizure-like activity.  According to the group home staff member who is here with the patient they were waiting at neurology to be seen, when the patient had seizure-like activity and was brought to the emergency department.  Here the patient was refusing to speak to staff members.  The staff member states the patient has a history of faking seizures in the past as well and he is not sure if this was a real seizure or not.  I have seen the patient, he is not answering any questions or following commands but he will look at you when you talk to them, eyes cross the midline.  No seizure-like activity or concern for ongoing seizure. ? ?Physical Exam  ? ?Triage Vital Signs: ?ED Triage Vitals  ?Enc Vitals Group  ?   BP 07/04/21 1430 (!) 105/91  ?   Pulse Rate 07/04/21 1430 93  ?   Resp 07/04/21 1430 20  ?   Temp 07/04/21 1430 98 ?F (36.7 ?C)  ?   Temp Source 07/04/21 1430 Axillary  ?   SpO2 07/04/21 1430 95 %  ?   Weight 07/04/21 1429 231 lb 14.8 oz (105.2 kg)  ?   Height 07/04/21 1429 6' (1.829 m)  ?   Head Circumference --   ?   Peak Flow --   ?   Pain Score 07/04/21 1428 0  ?   Pain Loc --   ?   Pain Edu? --   ?   Excl. in GC? --   ? ? ?Most recent vital signs: ?Vitals:  ? 07/04/21 1430  ?BP: (!) 105/91  ?Pulse: 93  ?Resp: 20  ?Temp: 98 ?F (36.7 ?C)  ?SpO2: 95%  ? ? ?General: Awake, no distress.  ?CV:  Good peripheral perfusion.  Regular rate and rhythm  ?Resp:  Normal effort.  Equal breath sounds bilaterally.  ?Abd:  No distention.  Soft, nontender.  No rebound or guarding. ? ? ?ED Results / Procedures / Treatments  ? ?RADIOLOGY ? ?I personally reviewed the CT images  appears to have frontal lobe associated left frontal lobe encephalomalacia. ?Radiology is read the CT is stable findings without acute abnormality ? ? ?MEDICATIONS ORDERED IN ED: ?Medications - No data to display ? ? ?IMPRESSION / MDM / ASSESSMENT AND PLAN / ED COURSE  ?I reviewed the triage vital signs and the nursing notes. ? ?Patient presents to the emergency department for reported seizure while waiting to be seen by the neurologist.  Staff member states they are waiting to be seen by neurology for a Vimpat refill with the patient had several minutes of seizure-like activity.  Patient is now refusing to speak or follow commands.  Shortly after I saw the patient the staff members left as they had other clients to care for, soon as the staff members left the room the patient asked if he could eat something and could get something to drink.  Is not clear whether the patient was postictal and now is more awake however he refused to speak at all and wants the staff members left within 30 seconds he was speaking.  We will check  labs and obtain a CT scan of the head as a precaution.  As long as the patient work-up appears well and the patient continues to appear well we will ensure the patient is able to eat and drink and we will refill his lacosamide prescription.  I reviewed the patient's last neurology visit from 11/17/2020, appears that the patient is on Vimpat 200 mg twice daily as well as Depakote 1000 mg twice daily. ? ?CT scan of the head is unchanged.  Patient's lab work including his chemistry and CBC resulted normal.  Valproic acid level of 96.  Patient is now awake alert talking.  Asking for something to eat and drink.  Patient has disconnected himself from the cardiac monitor and is walking around the room.  Patient is in no distress.  Coming to the nurses desk and asking when he can get something to eat.  Given the patient's reassuring work-up reassuring physical exam and as he is ambulating without  difficulty I believe the patient will be safe for discharge home.  We will refill the patient's Vimpat prescription as requested by group home staff member. ? ?FINAL CLINICAL IMPRESSION(S) / ED DIAGNOSES  ? ?Seizure-like activity ? ? ? ?Note:  This document was prepared using Dragon voice recognition software and may include unintentional dictation errors. ?  ?Minna Antis, MD ?07/04/21 1606 ? ?

## 2021-07-04 NOTE — ED Notes (Signed)
Turning point to send someone to pick up patient. ?

## 2021-07-04 NOTE — ED Triage Notes (Signed)
Pt here with seizures from the neurologist office. Pt had a prolonged seizure at the office with minimal eye reflexes. Pt not speaking on arrival to ED. Pt stable in triage. ?

## 2021-07-04 NOTE — ED Notes (Signed)
Patient alert and oriented at this time. Patient states he can't move or feel both legs. PT also asking for something to eat and drink. [place]uch[place], MD aware ?

## 2021-07-04 NOTE — ED Notes (Signed)
E-signature pad unavailable -  Information discussed with group home staff member - who verbalized understanding. ? ?

## 2021-07-04 NOTE — ED Notes (Signed)
Patient up walking in room without assistance. ?

## 2021-07-04 NOTE — ED Notes (Signed)
Legal guardian notified of patient discharge. Legal guardian states patient to be taken back by group home. Group home called x2 with no answer at this time. Charge RN aware ?

## 2021-07-11 ENCOUNTER — Emergency Department
Admission: EM | Admit: 2021-07-11 | Discharge: 2021-07-11 | Disposition: A | Discharge status: Home / Self Care | Payer: Medicare Other | Attending: Emergency Medicine | Admitting: Emergency Medicine

## 2021-07-11 ENCOUNTER — Emergency Department: Payer: Medicare Other

## 2021-07-11 ENCOUNTER — Encounter: Payer: Self-pay | Admitting: *Deleted

## 2021-07-11 ENCOUNTER — Other Ambulatory Visit: Payer: Self-pay

## 2021-07-11 DIAGNOSIS — R569 Unspecified convulsions: Secondary | ICD-10-CM | POA: Insufficient documentation

## 2021-07-11 LAB — URINALYSIS, ROUTINE W REFLEX MICROSCOPIC
Bilirubin Urine: NEGATIVE
Glucose, UA: NEGATIVE mg/dL
Hgb urine dipstick: NEGATIVE
Ketones, ur: NEGATIVE mg/dL
Leukocytes,Ua: NEGATIVE
Nitrite: NEGATIVE
Protein, ur: NEGATIVE mg/dL
Specific Gravity, Urine: 1.015 (ref 1.005–1.030)
pH: 7 (ref 5.0–8.0)

## 2021-07-11 LAB — CBC WITH DIFFERENTIAL/PLATELET
Abs Immature Granulocytes: 0.04 10*3/uL (ref 0.00–0.07)
Basophils Absolute: 0 10*3/uL (ref 0.0–0.1)
Basophils Relative: 1 %
Eosinophils Absolute: 0.2 10*3/uL (ref 0.0–0.5)
Eosinophils Relative: 3 %
HCT: 40.2 % (ref 39.0–52.0)
Hemoglobin: 13.1 g/dL (ref 13.0–17.0)
Immature Granulocytes: 1 %
Lymphocytes Relative: 40 %
Lymphs Abs: 2.5 10*3/uL (ref 0.7–4.0)
MCH: 31.3 pg (ref 26.0–34.0)
MCHC: 32.6 g/dL (ref 30.0–36.0)
MCV: 96.2 fL (ref 80.0–100.0)
Monocytes Absolute: 0.7 10*3/uL (ref 0.1–1.0)
Monocytes Relative: 11 %
Neutro Abs: 2.8 10*3/uL (ref 1.7–7.7)
Neutrophils Relative %: 44 %
Platelets: 290 10*3/uL (ref 150–400)
RBC: 4.18 MIL/uL — ABNORMAL LOW (ref 4.22–5.81)
RDW: 12.4 % (ref 11.5–15.5)
WBC: 6.3 10*3/uL (ref 4.0–10.5)
nRBC: 0 % (ref 0.0–0.2)

## 2021-07-11 LAB — COMPREHENSIVE METABOLIC PANEL
ALT: 19 U/L (ref 0–44)
AST: 22 U/L (ref 15–41)
Albumin: 3.7 g/dL (ref 3.5–5.0)
Alkaline Phosphatase: 51 U/L (ref 38–126)
Anion gap: 8 (ref 5–15)
BUN: 9 mg/dL (ref 6–20)
CO2: 29 mmol/L (ref 22–32)
Calcium: 8.8 mg/dL — ABNORMAL LOW (ref 8.9–10.3)
Chloride: 103 mmol/L (ref 98–111)
Creatinine, Ser: 0.85 mg/dL (ref 0.61–1.24)
GFR, Estimated: >60 mL/min (ref >60)
Glucose, Bld: 85 mg/dL (ref 70–99)
Potassium: 4.3 mmol/L (ref 3.5–5.1)
Sodium: 140 mmol/L (ref 135–145)
Total Bilirubin: 0.3 mg/dL (ref 0.3–1.2)
Total Protein: 7.2 g/dL (ref 6.5–8.1)

## 2021-07-11 LAB — VALPROIC ACID LEVEL: Valproic Acid Lvl: 73 ug/mL (ref 50.0–100.0)

## 2021-07-11 NOTE — ED Triage Notes (Signed)
BIB [facility] for sz from [employer], lives in group home, h/o sz r/t TBI, last sz 1 week ago in his doctors w/r, and was sent and seen here 1 week ago. Postictal for EMS. No oral injury or incontinence. Friends reported: "he does not usually shake with sz". Cannot remember if he had his meds today. CBG 93. NSR and VSS for EMS. Arrives alert, NAD, calm, interactive, resps e/u, speaking softly, answering some questions, following commands. EDP present on arrival.  ?

## 2021-07-11 NOTE — ED Notes (Signed)
Legal [place] contacted. AM directed to call [place] [facility] [phone]. On call SW paged. Pending d/c. Pt given food tray.  ?

## 2021-07-11 NOTE — ED Notes (Signed)
Pt alert, NAD, calm, interactive. Verbalizes thirst and hunger. Given food/ drink.  ?

## 2021-07-11 NOTE — ED Notes (Signed)
[facility] returned call, working to obtain d/c group home information.  ?

## 2021-07-11 NOTE — Discharge Instructions (Signed)
Please call your neurologist to get follow-up to discuss his seizure medications.  And return to the ER if he develops worsening seizures or any other concerns ?

## 2021-07-11 NOTE — ED Provider Notes (Signed)
? ?[facility] ?Provider Note ? ? ? Event Date/Time  ? First MD Initiated Contact with Patient 07/11/21 1606   ?  (approximate) ? ? ?History  ? ?Seizures ? ? ?HPI ? ?Eddie Berry is a 36 y.o. male with past medical history of seizure disorder, traumatic brain injury who comes in to emergency department after seizure.  Patient was with a group home at [facility] when he fell out of his chair and had a reported seizure.  EMS was not able to tell me what the seizure look like just that it looked like his prior seizures per the staff.  Patient was refusing to speak to the staff members and there was concern that patient was postictal per EMS. ? ? ?I did review the note from 3/1 where there is some concern for possible pseudoseizures in the past as well.  There was some concern that patient did not start speaking till staff members had left.  It looks like they refilled his lacosamide prescription last time he was here.  Patient is on Vimpat and Depakote. ?Physical Exam  ? ?Triage Vital Signs: ?ED Triage Vitals [07/11/21 1605]  ?Enc Vitals Group  ?   BP (!) 148/114  ?   Pulse Rate 93  ?   Resp 18  ?   Temp 98.1 ?F (36.7 ?C)  ?   Temp src   ?   SpO2 98 %  ?   Weight   ?   Height   ?   Head Circumference   ?   Peak Flow   ?   Pain Score   ?   Pain Loc   ?   Pain Edu?   ?   Excl. in GC?   ? ? ?Most recent vital signs: ?Vitals:  ? 07/11/21 1605  ?BP: (!) 148/114  ?Pulse: 93  ?Resp: 18  ?Temp: 98.1 ?F (36.7 ?C)  ?SpO2: 98%  ? ? ? ?General: Awake, no distress.  ?CV:  Good peripheral perfusion.  ?Resp:  Normal effort.  ?Abd:  No distention.  ?Other:  Patient has facial droop on the right but I did review when I saw patient back in July 2022 and I noted that patient also had facial droop at that time thought to be secondary to his known TBI.  Otherwise he is moving his arms and legs normally although refusing to talk. ? ? ?ED Results / Procedures / Treatments  ? ?Labs ?(all labs ordered are listed, but  only abnormal results are displayed) ?Labs Reviewed  ?CBC WITH DIFFERENTIAL/PLATELET  ?COMPREHENSIVE METABOLIC PANEL  ?VALPROIC ACID LEVEL  ?LACOSAMIDE  ?URINALYSIS, ROUTINE W REFLEX MICROSCOPIC  ? ? ? ?EKG ? ?My interpretation of EKG: ? ?Normal sinus rate of 93 without any ST elevation or T wave inversions, normal intervals ? ?RADIOLOGY ?I have reviewed the CT personally no evidence of ICH. ?Waiting on read  ? ?1.  No evidence of acute intracranial abnormality. ?  ?2.  Stable chronic intracranial findings as detailed above. ?  ?3.  No evidence of acute cervical fracture or subluxation. ?  ?4.  No CT evidence of significant soft tissue injury. ?  ? ? ?PROCEDURES: ? ?Critical Care performed: No ? ?.1-3 Lead EKG Interpretation ?Performed by: Concha Se, MD ?Authorized by: Concha Se, MD  ? ?  Interpretation: normal   ?  ECG rate:  90 ?  ECG rate assessment: normal   ?  Rhythm: sinus rhythm   ?  Ectopy: none   ?  Conduction: normal   ? ? ?MEDICATIONS ORDERED IN ED: ?Medications - No data to display ? ? ?IMPRESSION / MDM / ASSESSMENT AND PLAN / ED COURSE  ?I reviewed the triage vital signs and the nursing notes. ?             ?               ? ?Patient comes in with concern for possible seizure versus pseudoseizure with concern for head trauma.  Suspect it was a Occupational hygienist given concern for postictal per EMS.  CT head ordered evaluate for intercranial hemorrhage, CT cervical evaluate for cervical fracture.  Labs ordered to evaluate for any Electra maladies, AKI.  Patient is neuro exam seems to be at baseline other than him refusing to talk but he is otherwise responding yes and no with nodding his head. ? ?Patient's urine is without evidence of UTI.  CBC is normal.  CMP is grossly reassuring.  Valproic acid level is appropriately elevated.  Lacosamide level is pending.  CT imaging was negative. ? ?5:46 PM patient reports that he like to stay here overnight.  Explained to patient that given everything is reassuring  without any recurrent seizures that he would be discharged back.  He is requesting something to drink.  We will give some p.o. challenge.  Patient is nonverbal and acting at his baseline self.  We will call group home to ensure that he has been getting his medications. ? ?5:53 PM I discussed with the group home who stated that patient has his medications and and they have been giving it to him and he do not need any additional refills.  I encouraged him to follow-up with neurology if he continues to have frequent episodes they may need to adjust his meds further.  They expressed understanding felt comfortable with going back to facility ? ? ? ?FINAL CLINICAL IMPRESSION(S) / ED DIAGNOSES  ? ?Final diagnoses:  ?Seizure (HCC)  ? ? ? ?Rx / DC Orders  ? ?ED Discharge Orders   ? ? None  ? ?  ? ? ? ?Note:  This document was prepared using Dragon voice recognition software and may include unintentional dictation errors. ?  ?Concha Se, MD ?07/11/21 1753 ? ?

## 2021-07-11 NOTE — ED Notes (Signed)
Pt roaming/ wandering near his room in hallway. Encouraged/ directed to stay in room. Given food and drink. Plan for d/c explained with rationale.  ?

## 2021-07-11 NOTE — ED Notes (Signed)
Jodi Geralds (Group home director and owner) notified by Marcelo Baldy [facility]PS) to pick up discharged pt from ED. ?

## 2021-07-13 LAB — LACOSAMIDE: Lacosamide: 6.2 ug/mL (ref 5.0–10.0)

## 2021-12-10 ENCOUNTER — Emergency Department (HOSPITAL_COMMUNITY): Payer: Medicare Other

## 2021-12-10 ENCOUNTER — Other Ambulatory Visit: Payer: Self-pay

## 2021-12-10 ENCOUNTER — Emergency Department (HOSPITAL_COMMUNITY)
Admission: EM | Admit: 2021-12-10 | Discharge: 2021-12-10 | Disposition: A | Discharge status: Home / Self Care | Payer: Medicare Other | Attending: Emergency Medicine | Admitting: Emergency Medicine

## 2021-12-10 ENCOUNTER — Encounter (HOSPITAL_COMMUNITY): Payer: Self-pay | Admitting: Emergency Medicine

## 2021-12-10 DIAGNOSIS — R569 Unspecified convulsions: Secondary | ICD-10-CM | POA: Diagnosis present

## 2021-12-10 LAB — I-STAT CHEM 8, ED
BUN: 16 mg/dL (ref 6–20)
Calcium, Ion: 1.18 mmol/L (ref 1.15–1.40)
Chloride: 102 mmol/L (ref 98–111)
Creatinine, Ser: 1 mg/dL (ref 0.61–1.24)
Glucose, Bld: 81 mg/dL (ref 70–99)
HCT: 39 % (ref 39.0–52.0)
Hemoglobin: 13.3 g/dL (ref 13.0–17.0)
Potassium: 4.2 mmol/L (ref 3.5–5.1)
Sodium: 140 mmol/L (ref 135–145)
TCO2: 27 mmol/L (ref 22–32)

## 2021-12-10 LAB — VALPROIC ACID LEVEL: Valproic Acid Lvl: 66 ug/mL (ref 50.0–100.0)

## 2021-12-10 LAB — CBG MONITORING, ED: Glucose-Capillary: 78 mg/dL (ref 70–99)

## 2021-12-10 MED ORDER — ACETAMINOPHEN 325 MG PO TABS
650.0000 mg | ORAL_TABLET | Freq: Once | ORAL | Status: AC
  Administered 2021-12-10: 650 mg via ORAL
  Filled 2021-12-10: qty 2

## 2021-12-10 NOTE — ED Provider Notes (Signed)
[place] DEPT Provider Note   CSN: [id number] Arrival date & time: 12/10/21  1150     History  Chief Complaint  Patient presents with   Seizures    Eddie Berry is a 36 y.o. male.  Patient with history of TBI/seizure disorder -- presents after having a seizure. Currently on Depakote, Vimpat and titrating up Lamictal per recent neurology note from [facility] 11/22/21.  Most of history regarding event obtained by EMS.  Patient was at day camp, had a focal seizure.  Patient fell and hit his head.  Per EMS report, 2 grand mall seizure lasting 10 seconds in route.  5 mg Versed given IM.  Blood sugar was normal.  Patient reports "feeling bad" over the past few days but cannot give additional details.  Currently complains of headache, pain in the ribs.  States that his body generally hurts all over.  Cannot tell me what medications he is currently taking.  No associated family at bedside.      Home Medications Prior to Admission medications   Medication Sig Start Date End Date Taking? Authorizing Provider  divalproex (DEPAKOTE) 500 MG DR tablet Take 1 tablet (500 mg total) by mouth 3 (three) times daily. 11/02/20 12/02/20  Naaman Plummer, MD  lacosamide (VIMPAT) 200 MG TABS tablet Take 1 tablet (200 mg total) by mouth 2 (two) times daily. 07/04/21   Harvest Dark, MD      Allergies    Antihistamines, diphenhydramine-type; Aspirin; Nsaids; and Prednisone    Review of Systems   Review of Systems  Physical Exam Updated Vital Signs BP (!) 139/95 (BP Location: Right Arm)   Pulse 73   Resp (!) 21   SpO2 99%   Physical Exam Vitals and nursing note reviewed.  Constitutional:      Appearance: He is well-developed.  HENT:     Head: Normocephalic and atraumatic. No raccoon eyes or Battle's sign.     Comments: No signs of trauma to the skull.    Right Ear: Tympanic membrane, ear canal and external ear normal. No hemotympanum.     Left Ear:  Tympanic membrane, ear canal and external ear normal. No hemotympanum.     Nose: Nose normal.  Eyes:     General: Lids are normal.     Conjunctiva/sclera: Conjunctivae normal.     Pupils: Pupils are equal, round, and reactive to light.     Comments: No visible hyphema  Cardiovascular:     Rate and Rhythm: Normal rate and regular rhythm.     Comments: Normal heart sounds. Pulmonary:     Effort: Pulmonary effort is normal.     Breath sounds: Normal breath sounds.     Comments: Lungs are clear to auscultation bilaterally. Abdominal:     Palpations: Abdomen is soft.     Tenderness: There is no abdominal tenderness. There is no guarding.  Musculoskeletal:        General: Normal range of motion.     Cervical back: Normal range of motion and neck supple. No tenderness or bony tenderness.     Thoracic back: No tenderness or bony tenderness.     Lumbar back: No tenderness or bony tenderness.     Comments: Patient moving arms and legs, purposefully, without any signs of pain.  Skin:    General: Skin is warm and dry.  Neurological:     Mental Status: He is alert and oriented to person, place, and time.     GCS:  GCS eye subscore is 4. GCS verbal subscore is 5. GCS motor subscore is 6.     Cranial Nerves: No cranial nerve deficit.     Sensory: No sensory deficit.     Coordination: Coordination normal.     Deep Tendon Reflexes: Reflexes are normal and symmetric.     Comments: Mild facial droop, suspect baseline    ED Results / Procedures / Treatments   Labs (all labs ordered are listed, but only abnormal results are displayed) Labs Reviewed  VALPROIC ACID LEVEL  CBG MONITORING, ED  I-STAT CHEM 8, ED    EKG None  Radiology CT HEAD WO CONTRAST ( )  Result Date: 12/10/2021 CLINICAL DATA:  Seizures, fall, trauma EXAM: CT HEAD WITHOUT CONTRAST TECHNIQUE: Contiguous axial images were obtained from the base of the skull through the vertex without intravenous contrast. RADIATION DOSE  REDUCTION: This exam was performed according to the departmental dose-optimization program which includes automated exposure control, adjustment of the mA and/or kV according to patient size and/or use of iterative reconstruction technique. COMPARISON:  07/11/2021 FINDINGS: Brain: No acute intracranial findings are seen. There are no signs of bleeding within the cranium. 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 Old infarcts are seen in the posteromedial right cerebellum and right basal ganglia. Vascular: Unremarkable. Skull: There is previous left frontotemporal craniotomy. Sinuses/Orbits: There is mucosal thickening in ethmoid and maxillary sinuses. Other: None. IMPRESSION: No acute intracranial findings are seen. 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 Mild chronic sinusitis. Electronically Signed   By: Ernie Avena M.D.   On: 12/10/2021 13:23    Procedures Procedures    Medications Ordered in ED Medications - No data to display  ED Course/ Medical Decision Making/ A&P    Patient seen and examined. History obtained directly from patient as well as EMS report.  Also reviewed external neurology notes.  Labs/EKG: Ordered CBG, Depakote level, i-STAT Chem-8.  Imaging: CT of the head given reported injury with paucity of information regarding event and patient with baseline neurodeficits.  Medications/Fluids: None ordered.  Most recent vital signs reviewed and are as follows: BP (!) 139/95 (BP Location: Right Arm)   Pulse 73   Resp (!) 21   SpO2 99%   Initial impression: Breakthrough seizure, patient appears to be back at baseline.  3:04 PM Reassessment performed. Patient appears stable, no further seizure activity. Caregiver now at bedside.   Labs personally reviewed and interpreted including: I-STAT Chem-8 unremarkable; valproic acid therapeutic; CBG normal.  Imaging personally visualized and interpreted including: CT head,  agree no acute findings including fracture or bleeding.  Reviewed pertinent lab work and imaging with patient at bedside. Questions answered.   Most current vital signs reviewed and are as follows: BP 101/67   Pulse 83   Temp 97.9 F (36.6 C) (Oral)   Resp 16   SpO2 94%   Plan: Will monitor for a little while longer, give him something to eat, ensure stability.  Plan for discharge.  4:22 PM Reassessment performed. Patient appears stable.  Caregiver in agreement with plan.  Reviewed pertinent lab work and imaging with patient at bedside. Questions answered.   Most current vital signs reviewed and are as follows: BP 111/68   Pulse 81   Temp 98.5 F (36.9 C) (Oral)   Resp 18   SpO2 99%   Plan: Discharge to home.   Other home care instructions discussed: Continue up titration of Lamictal  ED  return instructions discussed: Additional seizures, new symptoms or other concerns  Follow-up instructions discussed: Patient encouraged to follow-up with their PCP in 3 days.                           Medical Decision Making Amount and/or Complexity of Data Reviewed Labs: ordered. Radiology: ordered.  Risk OTC drugs.   Patient with seizure today.  Reportedly compliant with antiepileptics.  Stable during ED stay.  Normal electrolytes.  Depakote level therapeutic.  Continuing to increase Lamictal dosage per his neurologist.  Patient hit his head today during one of the seizures.  Head CT negative.  He is currently at his baseline.  The patient's vital signs, pertinent lab work and imaging were reviewed and interpreted as discussed in the ED course. Hospitalization was considered for further testing, treatments, or serial exams/observation. However as patient is well-appearing, has a stable exam, and reassuring studies today, I do not feel that they warrant admission at this time. This plan was discussed with the patient who verbalizes agreement and comfort with this plan and seems  reliable and able to return to the Emergency Department with worsening or changing symptoms.          Final Clinical Impression(s) / ED Diagnoses Final diagnoses:  Seizure [facility])    Rx / DC Orders ED Discharge Orders     None         Renne Crigler, PA-C 12/10/21 1623    Terrilee Files, MD 12/11/21 1023

## 2021-12-10 NOTE — Discharge Instructions (Addendum)
Please read and follow all provided instructions.  Your diagnoses today include:  1. Seizure (HCC)    Tests performed today include: Electrolytes: Are normal Depakote level: Was normal Vital signs. See below for your results today.   Medications prescribed:  None Take any prescribed medications only as directed.  Home care instructions:  Follow any educational materials contained in this packet.  Continue increasing the Lamictal as per instructed by your neurologist.  BE VERY CAREFUL not to take multiple medicines containing Tylenol (also called acetaminophen). Doing so can lead to an overdose which can damage your liver and cause liver failure and possibly death.   Follow-up instructions: Please follow-up with your primary care provider in the next 7 days for further evaluation of your symptoms.   Return instructions:  Please return to the Emergency Department if you experience worsening symptoms.  Please return if you have any other emergent concerns.  Additional Information:  Your vital signs today were: BP 111/68   Pulse 81   Temp 98.5 F (36.9 C) (Oral)   Resp 18   SpO2 99%  If your blood pressure (BP) was elevated above 135/85 this visit, please have this repeated by your doctor within one month. --------------

## 2021-12-10 NOTE — ED Triage Notes (Signed)
Patient BIBA from a special needs day camp d/t focal seizure witnessed by staff lasting 10 min. They report he fell and hit his head after seizure. EMS reports pt had 2 grand mal seizures en route lasting 10 seconds each.  BP 124/88 HR 80 NSR  CBG 100 5 mg Midazolam given IM

## 2021-12-17 ENCOUNTER — Emergency Department
Admission: EM | Admit: 2021-12-17 | Discharge: 2021-12-17 | Disposition: A | Discharge status: Home / Self Care | Payer: Medicare Other | Attending: Emergency Medicine | Admitting: Emergency Medicine

## 2021-12-17 ENCOUNTER — Other Ambulatory Visit: Payer: Self-pay

## 2021-12-17 ENCOUNTER — Emergency Department: Payer: Medicare Other

## 2021-12-17 ENCOUNTER — Institutional Professional Consult (permissible substitution): Payer: Medicare Other | Admitting: Primary Care

## 2021-12-17 DIAGNOSIS — R55 Syncope and collapse: Secondary | ICD-10-CM | POA: Diagnosis present

## 2021-12-17 LAB — CBC WITH DIFFERENTIAL/PLATELET
Abs Immature Granulocytes: 0.07 10*3/uL (ref 0.00–0.07)
Basophils Absolute: 0.1 10*3/uL (ref 0.0–0.1)
Basophils Relative: 1 %
Eosinophils Absolute: 0.3 10*3/uL (ref 0.0–0.5)
Eosinophils Relative: 4 %
HCT: 46 % (ref 39.0–52.0)
Hemoglobin: 15 g/dL (ref 13.0–17.0)
Immature Granulocytes: 1 %
Lymphocytes Relative: 41 %
Lymphs Abs: 2.8 10*3/uL (ref 0.7–4.0)
MCH: 31.1 pg (ref 26.0–34.0)
MCHC: 32.6 g/dL (ref 30.0–36.0)
MCV: 95.4 fL (ref 80.0–100.0)
Monocytes Absolute: 0.6 10*3/uL (ref 0.1–1.0)
Monocytes Relative: 9 %
Neutro Abs: 3 10*3/uL (ref 1.7–7.7)
Neutrophils Relative %: 44 %
Platelets: 430 10*3/uL — ABNORMAL HIGH (ref 150–400)
RBC: 4.82 MIL/uL (ref 4.22–5.81)
RDW: 12.4 % (ref 11.5–15.5)
WBC: 6.9 10*3/uL (ref 4.0–10.5)
nRBC: 0 % (ref 0.0–0.2)

## 2021-12-17 LAB — BASIC METABOLIC PANEL
Anion gap: 7 (ref 5–15)
BUN: 13 mg/dL (ref 6–20)
CO2: 27 mmol/L (ref 22–32)
Calcium: 9.1 mg/dL (ref 8.9–10.3)
Chloride: 103 mmol/L (ref 98–111)
Creatinine, Ser: 1.12 mg/dL (ref 0.61–1.24)
GFR, Estimated: >60 mL/min (ref >60)
Glucose, Bld: 90 mg/dL (ref 70–99)
Potassium: 4.2 mmol/L (ref 3.5–5.1)
Sodium: 137 mmol/L (ref 135–145)

## 2021-12-17 LAB — URINALYSIS, ROUTINE W REFLEX MICROSCOPIC
Bilirubin Urine: NEGATIVE
Glucose, UA: NEGATIVE mg/dL
Hgb urine dipstick: NEGATIVE
Ketones, ur: NEGATIVE mg/dL
Leukocytes,Ua: NEGATIVE
Nitrite: NEGATIVE
Protein, ur: NEGATIVE mg/dL
Specific Gravity, Urine: 1.002 — ABNORMAL LOW (ref 1.005–1.030)
pH: 7 (ref 5.0–8.0)

## 2021-12-17 LAB — CBG MONITORING, ED: Glucose-Capillary: 90 mg/dL (ref 70–99)

## 2021-12-17 MED ORDER — SODIUM CHLORIDE 0.9 % IV BOLUS
1000.0000 mL | Freq: Once | INTRAVENOUS | Status: AC
  Administered 2021-12-17: 1000 mL via INTRAVENOUS

## 2021-12-17 NOTE — ED Triage Notes (Signed)
Came in for a pulmonology visit and had syncopal episode in front of elevator.

## 2021-12-17 NOTE — Progress Notes (Deleted)
@Patient  ID: , male    DOB: October 12, 1985, 36 y.o.   MRN: [id number]  No chief complaint on file.   Referring provider: [id number], NP  HPI: 36 year old male, current every day smoker.  Past medical history significant for traumatic brain injury, hypothyroidism, vitamin D deficiency, obesity, seizures.  12/17/2021 Patient presents today for sleep consult.  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  Patient has frequent blackouts.  He has a history of seizures and will suddenly fall to the floor and passed out.       Sleep questionnaire Symptoms-  ***  Prior sleep study- *** Bedtime- *** Time to fall asleep- ***  Nocturnal awakenings- *** Out of bed/start of day- ***  Weight changes- *** Do you operate heavy machinery- *** Do you currently wear CPAP- *** Do you current wear oxygen- *** Epworth- *** Medications-trazodone 50 mg at bedtime   Allergies  Allergen Reactions   Antihistamines, Diphenhydramine-Type Other (See Comments)    UNK reaction   Aspirin Other (See Comments)    UNK reaction   Nsaids Other (See Comments)    Unk reaction   Prednisone Other (See Comments)    UNK reaction     There is no immunization history on file for this patient.  Past Medical History:  Diagnosis Date   Seizures (HCC)    TBI (traumatic brain injury) (HCC)     Tobacco History: Social History   Tobacco Use  Smoking Status Every Day  Smokeless Tobacco Never   Ready to quit: Not Answered Counseling given: Not Answered   Outpatient Medications Prior to Visit  Medication Sig Dispense Refill   divalproex (DEPAKOTE) 500 MG DR tablet Take 1 tablet (500 mg total) by mouth 3 (three) times daily. 90 tablet 0   lacosamide (VIMPAT) 200 MG TABS tablet Take 1 tablet (200 mg total) by mouth 2 (two) times daily. 60 tablet 2   No facility-administered medications prior to visit.      Review of Systems  Review of Systems   Physical Exam  There were no vitals  taken for this visit. Physical Exam   Lab Results:  CBC    Component Value Date/Time   WBC 6.3 07/11/2021 1609   RBC 4.18 (L) 07/11/2021 1609   HGB 13.3 12/10/2021 1234   HCT 39.0 12/10/2021 1234   PLT 290 07/11/2021 1609   MCV 96.2 07/11/2021 1609   MCH 31.3 07/11/2021 1609   MCHC 32.6 07/11/2021 1609   RDW 12.4 07/11/2021 1609   LYMPHSABS 2.5 07/11/2021 1609   MONOABS 0.7 07/11/2021 1609   EOSABS 0.2 07/11/2021 1609   BASOSABS 0.0 07/11/2021 1609    BMET    Component Value Date/Time   NA 140 12/10/2021 1234   K 4.2 12/10/2021 1234   CL 102 12/10/2021 1234   CO2 29 07/11/2021 1609   GLUCOSE 81 12/10/2021 1234   BUN 16 12/10/2021 1234   CREATININE 1.00 12/10/2021 1234   CALCIUM 8.8 (L) 07/11/2021 1609   GFRNONAA >60 07/11/2021 1609    BNP No results found for: "BNP"  ProBNP No results found for: "PROBNP"  Imaging: CT HEAD WO CONTRAST (09/10/2021)  Result Date: 12/10/2021 CLINICAL DATA:  Seizures, fall, trauma EXAM: CT HEAD WITHOUT CONTRAST TECHNIQUE: Contiguous axial images were obtained from the base of the skull through the vertex without intravenous contrast. RADIATION DOSE REDUCTION: This exam was performed according to the departmental dose-optimization program which includes automated exposure control, adjustment of the mA and/or kV according to patient  size and/or use of iterative reconstruction technique. COMPARISON:  07/11/2021 FINDINGS: Brain: No acute intracranial findings are seen. There are no signs of bleeding within the cranium. 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 Old infarcts are seen in the posteromedial right cerebellum and right basal ganglia. Vascular: Unremarkable. Skull: There is previous left frontotemporal craniotomy. Sinuses/Orbits: There is mucosal thickening in ethmoid and maxillary sinuses. Other: None. IMPRESSION: No acute intracranial findings are seen. 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 Mild chronic sinusitis. Electronically Signed   By: Ernie Avena M.D.   On: 12/10/2021 13:23     Assessment & Plan:   No problem-specific Assessment & Plan notes found for this encounter.     Glenford Bayley, NP 12/17/2021

## 2021-12-17 NOTE — Significant Event (Signed)
Rapid Response Event Note   Reason for Call :   Syncopal episode Initial Focused Assessment:  Patient had collapsed on floor- (per friend who is beside him) and hit his head.  Patient unresponsive.     Interventions:  Placed on montior- oxygen 96% on room air and BP stable.    Plan of Care:  Patient transferred to ED 11.   Event Summary:   MD Notified:  Call Time: Arrival Time: End Time:  Vincente Poli, RN

## 2021-12-17 NOTE — ED Provider Notes (Signed)
[facility] Provider Note    Event Date/Time   First MD Initiated Contact with Patient 12/17/21 1201     (approximate)   History   Chief Complaint: Loss of Consciousness   HPI  Tauheed Mcfayden is a 36 y.o. male with a history of TBI mood disorder and frequent passing out spells to is brought to the ED due to loss of consciousness and fall.  Patient is accompanied by a  representative of a group home where the patient resides.  They stated these episodes happen often and he has had an extensive neurology evaluation which has been negative.  Today the patient was going to pulmonology clinic to be evaluated for possible narcolepsy, and after the appointment he was sitting in a chair waiting for an elevator, and when he stood up he seemed to lose consciousness and fall to the ground.  He did hit his head on the floor.  Group home representative notes that the patient has been in his usual state of health lately, eating and drinking normally, not complaining of any pain.     Physical Exam   Triage Vital Signs: ED Triage Vitals  Enc Vitals Group     BP 12/17/21 1158 (!) 130/105     Pulse Rate 12/17/21 1158 69     Resp 12/17/21 1158 (!) 23     Temp 12/17/21 1158 (!) 97.4 F (36.3 C)     Temp Source 12/17/21 1158 Axillary     SpO2 12/17/21 1157 98 %     Weight 12/17/21 1200 239 lb 11.2 oz (108.7 kg)     Height 12/17/21 1200 6' (1.829 m)     Head Circumference --      Peak Flow --      Pain Score --      Pain Loc --      Pain Edu? --      Excl. in GC? --     Most recent vital signs: Vitals:   12/17/21 1157 12/17/21 1158  BP:  (!) 130/105  Pulse:  69  Resp:  (!) 23  Temp:  (!) 97.4 F (36.3 C)  SpO2: 98% 97%    General: Awake, no distress.  Not interactive. CV:  Good peripheral perfusion.  Regular rate and rhythm.  Normal peripheral pulses Resp:  Normal effort.  Clear to auscultation bilaterally Abd:  No distention.  Soft  nontender Other:  Long bones stable, musculoskeletal exam unremarkable.  Full range of motion all joints.  Chest wall and pelvis are stable. PERRL, EOMI, gaze is conjugate.   ED Results / Procedures / Treatments   Labs (all labs ordered are listed, but only abnormal results are displayed) Labs Reviewed  CBC WITH DIFFERENTIAL/PLATELET - Abnormal; Notable for the following components:      Result Value   Platelets 430 (*)    All other components within normal limits  URINALYSIS, ROUTINE W REFLEX MICROSCOPIC - Abnormal; Notable for the following components:   Color, Urine COLORLESS (*)    APPearance CLEAR (*)    Specific Gravity, Urine 1.002 (*)    All other components within normal limits  BASIC METABOLIC PANEL  CBG MONITORING, ED     EKG Interpreted by me Sinus rhythm rate of 72.  Normal axis and intervals.  Poor R wave progression.  Normal ST segments and T waves.   RADIOLOGY CT head interpreted by me, negative for intracranial hemorrhage or skull fracture.  Radiology report reviewed.   PROCEDURES:  Procedures   MEDICATIONS ORDERED IN ED: Medications  sodium chloride 0.9 % bolus 1,000 mL (1,000 mLs Intravenous New Bag/Given 12/17/21 1320)     IMPRESSION / MDM / ASSESSMENT AND PLAN / ED COURSE  I reviewed the triage vital signs and the nursing notes.                              Differential diagnosis includes, but is not limited to, pseudoseizure, dehydration, vasovagal syncope, seizure, intracranial hemorrhage  Patient's presentation is most consistent with acute presentation with potential threat to life or bodily function.   ----------------------------------------- 2:14 PM on 12/17/2021 ----------------------------------------- Patient presents with a suspected syncope.  CT head unremarkable, no other signs of trauma.  Patient is returned to baseline mental status.  Labs are reassuring.  Per group home representative this is a frequent occurrence for the  patient and not out of the ordinary and he has had extensive neurologic work-up.  Stable for discharge        FINAL CLINICAL IMPRESSION(S) / ED DIAGNOSES   Final diagnoses:  Syncope, unspecified syncope type     Rx / DC Orders   ED Discharge Orders     None        Note:  This document was prepared using Dragon voice recognition software and may include unintentional dictation errors.   Sharman Cheek, MD 12/17/21 1415

## 2021-12-17 NOTE — Discharge Instructions (Signed)
Your lab tests and CT scan of the head were all okay today. Please continue taking your medications and following up with your doctors as usual.

## 2021-12-21 ENCOUNTER — Ambulatory Visit (INDEPENDENT_AMBULATORY_CARE_PROVIDER_SITE_OTHER): Payer: Medicare Other | Admitting: Primary Care

## 2021-12-21 ENCOUNTER — Encounter: Payer: Self-pay | Admitting: Primary Care

## 2021-12-21 VITALS — BP 124/80 | HR 88 | Temp 97.8°F | Ht 72.0 in | Wt 213.0 lb

## 2021-12-21 DIAGNOSIS — R569 Unspecified convulsions: Secondary | ICD-10-CM

## 2021-12-21 DIAGNOSIS — G4719 Other hypersomnia: Secondary | ICD-10-CM | POA: Diagnosis not present

## 2021-12-21 NOTE — Assessment & Plan Note (Addendum)
-   Patient has symptoms of excessive daytime sleepiness with associated snoring and restless sleep.  He has a history of traumatic brain injury and seizures.  He follows with neurology.  He has recently been noted to be having blackouts where he will suddenly fall to the floor when standing or transferring.  Events are happening more frequently and lasting for up to 30 minutes.  He is noted to be snoring during blackouts.  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  He will need in lab polysomnography to rule out sleep apnea as cause of daytime sleepiness and MSLT to assess for narcolepsy. He will need to hold Zolof 1-2 weeks prior to testing. Reviewed with caretaker that he should be monitored for mood changes or SI. Medications could also be contributing to daytime sleepiness and should be re-assess upon follow-up after testing. FU in 6 weeks with Dr. Craige Cotta in [place].

## 2021-12-21 NOTE — Patient Instructions (Signed)
Sleep apnea is defined as period of 10 seconds or longer when you stop breathing at night. This can happen multiple times a night. Dx sleep apnea is when this occurs more than 5 times an hour.    Mild OSA 5-15 apneic events an hour Moderate OSA 15-30 apneic events an hour Severe OSA > 30 apneic events an hour   Untreated sleep apnea puts you at higher risk for cardiac arrhythmias, pulmonary HTN, stroke and diabetes   Treatment options include weight loss, side sleeping position, oral appliance, CPAP therapy or referral to ENT for possible surgical options    Recommendations: Focus on side sleeping position or elevate head of bed at night 30 degrees  Needs help with transfers at all times  Aim to take 1-2 naps a day for 20-30 mins to help with daytime sleepiness    Orders: Polysomnography (to rule out sleep apnea as cause of daytime sleepiness) Multi sleep latency test (aka- nap test to rule out narcolepsy as cause of daytime sleepiness)   Follow-up: 6-8 week follow-up with Dr. Craige Cotta please in [place] - 30 mins (if nothing available ok to see Beth again)  Narcolepsy Narcolepsy is a neurological disorder that causes people to fall asleep suddenly and without control (have sleep attacks) during the daytime. It is a lifelong disorder. Narcolepsy disrupts the sleep cycle at night, which then causes daytime sleepiness. What are the causes? The cause of narcolepsy is not fully understood, but it may be related to: Low levels of hypocretin, a chemical (neurotransmitter) in the brain that controls sleep and wake cycles. Hypocretin imbalance may be caused by: Abnormal genes that are passed from parent to child (inherited). An autoimmune disease in which the body's defense system (immune system) attacks the brain cells that make hypocretin. Infection, tumor, or injury in the area of the brain that controls sleep. Exposure to poisons (toxins), such as heavy metals, pesticides, and secondhand  smoke. What are the signs or symptoms? Symptoms of this condition include: Excessive daytime sleepiness. This is the most common symptom and is usually the first symptom you will notice. This may affect your performance at work or school. Sleep attacks. You may fall asleep in the middle of an activity, especially low-energy activities like reading or watching TV. Feeling like you cannot think clearly and trouble focusing or remembering things. You may also feel depressed. Sudden muscle weakness (cataplexy). When this occurs, your speech may become slurred, or your knees may buckle. Cataplexy is usually triggered by surprise, anger, fear, or laughter. Losing the ability to speak or move (sleep paralysis). This may occur just as you start to fall asleep or wake up. You will be aware of the paralysis. It usually lasts for just a few seconds or minutes. Seeing, hearing, tasting, smelling, or feeling things that are not real (hallucinations). Hallucinations may occur with sleep paralysis. They can happen when you are falling asleep, waking up, or dozing. Trouble staying asleep at night (insomnia) and restless sleep. How is this diagnosed? This condition may be diagnosed based on: A physical exam to rule out any other problems that may be causing your symptoms. You may be asked to write down your sleeping patterns for several weeks in a sleep diary. This will help your health care provider make a diagnosis. Sleep studies that measure how well your REM sleep is regulated. These tests also measure your heart rate, breathing, movement, and brain waves. These tests include: An overnight sleep study (polysomnogram). A daytime sleep study  that is done while you take several naps during the day (multiple sleep latency test, MSLT). This test measures how quickly you fall asleep and how quickly you enter REM sleep. Removal of spinal fluid to measure hypocretin levels. How is this treated? There is no cure for  this condition, but treatment can help relieve symptoms. Treatment may include: Lifestyle and sleeping strategies to help you cope with the condition, such as: Exercising regularly. Maintaining a regular sleep schedule. Avoiding caffeine and large meals before bed. Medicines. These may include: Medicines that help keep you awake and alert (stimulants) to fight daytime sleepiness. Medicines that treat depression (antidepressants). These may be used to treat cataplexy. Sodium oxybate. This is a strong medicine to help you relax (sedative) that you may take at night. It can help control daytime sleepiness and cataplexy. Other treatments may include mental health counseling or joining a support group. Follow these instructions at home: Sleeping habits  Get about 8 hours of sleep every night. Go to sleep and get up at about the same time every day. Keep your bedroom dark, quiet, and comfortable. When you feel very tired, take short naps. Schedule naps so that you take them at about the same time every day. Before bedtime: Avoid bright lights and screens. Relax. Try activities like reading or taking a warm bath. Activity Get at least 20 minutes of exercise every day. This will help you sleep better at night and reduce daytime sleepiness. Avoid exercising within 3 hours of bedtime. Do not drive or use heavy machinery if you are sleepy. If possible, take a nap before driving. Do not swim or go out on the water without a life jacket. Eating and drinking Do not drink alcohol or caffeinated beverages within 4-5 hours of bedtime. Do not eat a large meal before bedtime. Eat meals at about the same times every day. General instructions  Take over-the-counter and prescription medicines only as told by your health care provider. Keep a sleep diary as told by your health care provider. Tell your employer or teachers that you have narcolepsy. You may be able to adjust your schedule to include time for  naps. Do not use any products that contain nicotine or tobacco, such as cigarettes, e-cigarettes, and chewing tobacco. If you need help quitting, ask your health care provider. Keep all follow-up visits as told by your health care provider. This is important. Where to find more information [employer] of Neurological Disorders: [employer] Contact a health care provider if: Your symptoms are not getting better. You have increasingly high blood pressure (hypertension). You have changes in your heart rhythm. You are having a hard time determining what is real and what is not (psychosis). Get help right away if you: Hurt yourself during a sleep attack or an attack of cataplexy. Have chest pain. Have trouble breathing. These symptoms may represent a serious problem that is an emergency. Do not wait to see if the symptoms will go away. Get medical help right away. Call your local emergency services (911 in the [place] Do not drive yourself to the hospital. Summary Narcolepsy is a neurological disorder that causes people to fall asleep suddenly, and without control, during the daytime (sleep attacks). It is a lifelong disorder. There is no cure for this condition, but treatment can help relieve symptoms. Go to sleep and get up at about the same time every day. Follow instructions about sleep and activities as told by your health care provider. Take over-the-counter and prescription medicines  only as told by your health care provider. This information is not intended to replace advice given to you by your health care provider. Make sure you discuss any questions you have with your health care provider. Document Revised: 05/28/2021 Document Reviewed: 12/02/2018 Elsevier Patient Education  2023 Else[employer] Inc.  Sleep Apnea Sleep apnea affects breathing during sleep. It causes breathing to stop for 10 seconds or more, or to become shallow. People with sleep apnea usually snore loudly. It  can also increase the risk of: Heart attack. Stroke. Being very overweight (obese). Diabetes. Heart failure. Irregular heartbeat. High blood pressure. The goal of treatment is to help you breathe normally again. What are the causes?  The most common cause of this condition is a collapsed or blocked airway. There are three kinds of sleep apnea: Obstructive sleep apnea. This is caused by a blocked or collapsed airway. Central sleep apnea. This happens when the brain does not send the right signals to the muscles that control breathing. Mixed sleep apnea. This is a combination of obstructive and central sleep apnea. What increases the risk? Being overweight. Smoking. Having a small airway. Being older. Being male. Drinking alcohol. Taking medicines to calm yourself (sedatives or tranquilizers). Having family members with the condition. Having a tongue or tonsils that are larger than normal. What are the signs or symptoms? Trouble staying asleep. Loud snoring. Headaches in the morning. Waking up gasping. Dry mouth or sore throat in the morning. Being sleepy or tired during the day. If you are sleepy or tired during the day, you may also: Not be able to focus your mind (concentrate). Forget things. Get angry a lot and have mood swings. Feel sad (depressed). Have changes in your personality. Have less interest in sex, if you are male. Be unable to have an erection, if you are male. How is this treated?  Sleeping on your side. Using a medicine to get rid of mucus in your nose (decongestant). Avoiding the use of alcohol, medicines to help you relax, or certain pain medicines (narcotics). Losing weight, if needed. Changing your diet. Quitting smoking. Using a machine to open your airway while you sleep, such as: An oral appliance. This is a mouthpiece that shifts your lower jaw forward. A CPAP device. This device blows air through a mask when you breathe out (exhale). An  EPAP device. This has valves that you put in each nostril. A BIPAP device. This device blows air through a mask when you breathe in (inhale) and breathe out. Having surgery if other treatments do not work. Follow these instructions at home: Lifestyle Make changes that your doctor recommends. Eat a healthy diet. Lose weight if needed. Avoid alcohol, medicines to help you relax, and some pain medicines. Do not smoke or use any products that contain nicotine or tobacco. If you need help quitting, ask your doctor. General instructions Take over-the-counter and prescription medicines only as told by your doctor. If you were given a machine to use while you sleep, use it only as told by your doctor. If you are having surgery, make sure to tell your doctor you have sleep apnea. You may need to bring your device with you. Keep all follow-up visits. Contact a doctor if: The machine that you were given to use during sleep bothers you or does not seem to be working. You do not get better. You get worse. Get help right away if: Your chest hurts. You have trouble breathing in enough air. You have an uncomfortable  feeling in your back, arms, or stomach. You have trouble talking. One side of your body feels weak. A part of your face is hanging down. These symptoms may be an emergency. Get help right away. Call your local emergency services (911 in the [place] Do not wait to see if the symptoms will go away. Do not drive yourself to the hospital. Summary This condition affects breathing during sleep. The most common cause is a collapsed or blocked airway. The goal of treatment is to help you breathe normally while you sleep. This information is not intended to replace advice given to you by your health care provider. Make sure you discuss any questions you have with your health care provider. Document Revised: 11/29/2020 Document Reviewed: 03/31/2020 Elsevier Patient Education  2023 [employer].

## 2021-12-21 NOTE — Assessment & Plan Note (Addendum)
-   He would likely benefit from extended monitoring EEG to determine if seizures are causing blackouts. Another possible cause of blackouts could be related to autonomic dysfunction.  - Maintained on depakote twice daily, lamictal 150mg  BID, vimpat 200mg  BID - Following with neurology

## 2021-12-21 NOTE — Progress Notes (Signed)
@Patient  ID: , male    DOB: [birth year], 36 y.o.   MRN: [id number]  Chief Complaint  Patient presents with   pulmonary consult    No prior sleep study-c/o daytime sleepiness, loud snoring and restless sleep.     Referring provider: [id number], NP  HPI: 36 year old male, current every day smoker.  Past medical history significant for traumatic brain injury, hypothyroidism, vitamin D deficiency, obesity, seizures.  12/21/2021 Patient presents today for sleep consult. Lives in group home, here with caretaker.  He has symptoms of excessive daytime sleepiness, loud snoring and restless sleep. 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  He has a history of TBI and seizures. Patient has been having frequent blackouts. He will suddenly fall to the floor when standing and pass out. Events are happening more frequently and lasting for up to 30 minutes.  Patient is noted to be snoring during blackouts.  He follows with neurology for history of seizures and is currently on Depakote, clozaril and Lamictal.  He takes 50 mg of trazodone and melatonin 10mg  at bedtime for sleep.  He is also on Zoloft 150 mg daily. He does not sleep well at night and is sleepy during the day. He is wearing helmet today for safely and is in a transfer wheelchair   Sleep questionnaire Symptoms-  snoring, restless sleep and daytime sleepiness  Prior sleep study- None Bedtime- 9pm Time to fall asleep- 1 hour  Nocturnal awakenings- none Out of bed/start of day- 11am  Weight changes- 15 lbs Do you operate heavy machinery- No Do you currently wear CPAP- No Do you current wear oxygen- No [facility]- 20 Medications-trazodone 50 mg at bedtime, melatonin 10mg  qhs, zoloft 150mg  daily, depakote twice daily, clozaril 200mg  daily, lamictal 150mg , vimpat 200mg  twice daily   Allergies  Allergen Reactions   Antihistamines, Diphenhydramine-Type Other (See Comments)    UNK reaction   Aspirin Other (See Comments)    UNK  reaction   Nsaids Other (See Comments)    Unk reaction   Prednisone Other (See Comments)    UNK reaction     There is no immunization history on file for this patient.  Past Medical History:  Diagnosis Date   Seizures (HCC)    TBI (traumatic brain injury) (HCC)     Tobacco History: Social History   Tobacco Use  Smoking Status Former   Packs/day: 1.00   Years: 22.00   Total pack years: 22.00   Types: Cigarettes   Quit date: 2022   Years since quitting: 1.6  Smokeless Tobacco Never   Counseling given: Not Answered   Outpatient Medications Prior to Visit  Medication Sig Dispense Refill   acetaminophen (TYLENOL) 500 MG tablet Take 500 mg by mouth every 6 (six) hours as needed.     albuterol (VENTOLIN HFA) 108 (90 Base) MCG/ACT inhaler Inhale 2 puffs into the lungs every 6 (six) hours as needed for wheezing or shortness of breath.     azelastine (ASTELIN) 0.1 % nasal spray Place 2 sprays into both nostrils as needed for rhinitis. Use in each nostril as directed     cetirizine (ZYRTEC) 10 MG tablet Take 10 mg by mouth daily.     Cholecalciferol (VITAMIN D3) 50 MCG (2000 UT) TABS Take 1 tablet by mouth daily.     clozapine (CLOZARIL) 200 MG tablet Take 200 mg by mouth daily.     cloZAPine (CLOZARIL) 25 MG tablet Take 25 mg by mouth daily.     divalproex (DEPAKOTE) 250  MG DR tablet Take 250 mg by mouth 2 (two) times daily.     divalproex (DEPAKOTE) 500 MG DR tablet Take 500 mg by mouth 2 (two) times daily.     docusate sodium (COLACE) 100 MG capsule Take 100 mg by mouth 2 (two) times daily.     fenofibrate (TRICOR) 48 MG tablet Take 48 mg by mouth daily.     fluticasone (FLONASE) 50 MCG/ACT nasal spray Place 2 sprays into both nostrils daily.     lacosamide (VIMPAT) 200 MG TABS tablet Take 1 tablet (200 mg total) by mouth 2 (two) times daily. 60 tablet 2   lamoTRIgine (LAMICTAL) 100 MG tablet Take 100 mg by mouth daily.     lamoTRIgine (LAMICTAL) 150 MG tablet Take 150 mg by  mouth daily.     levothyroxine (SYNTHROID) 25 MCG tablet Take 25 mcg by mouth daily before breakfast.     Melatonin 10 MG TABS Take 1 tablet by mouth at bedtime.     mirabegron ER (MYRBETRIQ) 50 MG TB24 tablet Take 50 mg by mouth daily.     Multiple Vitamin (THEREMS PO) Take 1 tablet by mouth daily.     omeprazole (PRILOSEC) 20 MG capsule Take 20 mg by mouth daily.     paliperidone (INVEGA) 6 MG 24 hr tablet Take 6 mg by mouth daily.     propranolol (INDERAL) 10 MG tablet Take 10 mg by mouth daily.     sertraline (ZOLOFT) 100 MG tablet Take 100 mg by mouth daily.     sertraline (ZOLOFT) 50 MG tablet Take 50 mg by mouth at bedtime.     traZODone (DESYREL) 50 MG tablet Take 50 mg by mouth at bedtime.     divalproex (DEPAKOTE) 500 MG DR tablet Take 1 tablet (500 mg total) by mouth 3 (three) times daily. 90 tablet 0   No facility-administered medications prior to visit.   Review of Systems  Review of Systems  Constitutional:  Positive for fatigue.  HENT: Negative.    Respiratory: Negative.    Cardiovascular: Negative.   Neurological:  Positive for seizures.  Psychiatric/Behavioral:  Positive for sleep disturbance.     Physical Exam  BP 124/80 (BP Location: Left Arm, Cuff Size: Normal)   Pulse 88   Temp 97.8 F (36.6 C) (Temporal)   Ht 6' (1.829 m)   Wt 213 lb (96.6 kg)   SpO2 95%   BMI 28.89 kg/m  Physical Exam Constitutional:      General: He is not in acute distress.    Appearance: Normal appearance. He is not ill-appearing.  HENT:     Head: Normocephalic and atraumatic.  Cardiovascular:     Rate and Rhythm: Normal rate and regular rhythm.  Pulmonary:     Effort: Pulmonary effort is normal.     Breath sounds: Normal breath sounds.  Musculoskeletal:        General: Normal range of motion.     Cervical back: Normal range of motion and neck supple.     Comments: In transport wheelchair  Skin:    General: Skin is warm and dry.  Neurological:     General: No focal  deficit present.     Mental Status: He is alert. Mental status is at baseline.  Psychiatric:        Mood and Affect: Mood normal.        Behavior: Behavior normal.      Lab Results:  CBC    Component Value Date/Time  WBC 6.9 12/17/2021 1210   RBC 4.82 12/17/2021 1210   HGB 15.0 12/17/2021 1210   HCT 46.0 12/17/2021 1210   PLT 430 (H) 12/17/2021 1210   MCV 95.4 12/17/2021 1210   MCH 31.1 12/17/2021 1210   MCHC 32.6 12/17/2021 1210   RDW 12.4 12/17/2021 1210   LYMPHSABS 2.8 12/17/2021 1210   MONOABS 0.6 12/17/2021 1210   EOSABS 0.3 12/17/2021 1210   BASOSABS 0.1 12/17/2021 1210    BMET    Component Value Date/Time   NA 137 12/17/2021 1210   K 4.2 12/17/2021 1210   CL 103 12/17/2021 1210   CO2 27 12/17/2021 1210   GLUCOSE 90 12/17/2021 1210   BUN 13 12/17/2021 1210   CREATININE 1.12 12/17/2021 1210   CALCIUM 9.1 12/17/2021 1210   GFRNONAA >60 12/17/2021 1210    BNP No results found for: "BNP"  ProBNP No results found for: "PROBNP"  Imaging: CT Head Wo Contrast  Result Date: 12/17/2021 CLINICAL DATA:  Head trauma EXAM: CT HEAD WITHOUT CONTRAST TECHNIQUE: Contiguous axial images were obtained from the base of the skull through the vertex without intravenous contrast. RADIATION DOSE REDUCTION: This exam was performed according to the departmental dose-optimization program which includes automated exposure control, adjustment of the mA and/or kV according to patient size and/or use of iterative reconstruction technique. COMPARISON:  Head CT dated August 10, 2021 FINDINGS: Brain: Encephalomalacia of the bilateral frontal lobes and left temporal lobe, unchanged when compared with the prior exam and likely due to prior infarcts.No evidence of acute infarction, hemorrhage, hydrocephalus, extra-axial collection or mass lesion/mass effect. Vascular: No hyperdense vessel or unexpected calcification. Skull: Normal. Negative for fracture or focal lesion. Sinuses/Orbits: No acute  finding. Other: None. IMPRESSION: No acute intracranial abnormality. Electronically Signed   By: Allegra Lai M.D.   On: 12/17/2021 12:36   CT HEAD WO CONTRAST ( )  Result Date: 12/10/2021 CLINICAL DATA:  Seizures, fall, trauma EXAM: CT HEAD WITHOUT CONTRAST TECHNIQUE: Contiguous axial images were obtained from the base of the skull through the vertex without intravenous contrast. RADIATION DOSE REDUCTION: This exam was performed according to the departmental dose-optimization program which includes automated exposure control, adjustment of the mA and/or kV according to patient size and/or use of iterative reconstruction technique. COMPARISON:  07/11/2021 FINDINGS: Brain: No acute intracranial findings are seen. There are no signs of bleeding within the cranium. 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 Old infarcts are seen in the posteromedial right cerebellum and right basal ganglia. Vascular: Unremarkable. Skull: There is previous left frontotemporal craniotomy. Sinuses/Orbits: There is mucosal thickening in ethmoid and maxillary sinuses. Other: None. IMPRESSION: No acute intracranial findings are seen. 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 Mild chronic sinusitis. Electronically Signed   By: Ernie Avena M.D.   On: 12/10/2021 13:23     Assessment & Plan:   Excessive daytime sleepiness - Patient has symptoms of excessive daytime sleepiness with associated snoring and restless sleep.  He has a history of traumatic brain injury and seizures.  He follows with neurology.  He has recently been noted to be having blackouts where he will suddenly fall to the floor when standing or transferring.  Events are happening more frequently and lasting for up to 30 minutes.  He is noted to be snoring during blackouts.  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  He will need in lab polysomnography to rule out sleep apnea as cause of daytime sleepiness  and  MSLT to assess for narcolepsy. He will need to hold Zolof 1-2 weeks prior to testing. Reviewed with caretaker that he should be monitored for mood changes or SI. Medications could also be contributing to daytime sleepiness and should be re-assess upon follow-up after testing. FU in 6 weeks with Dr. Craige Cotta in [place].   Seizure (HCC) - He would likely benefit from extended monitoring EEG to determine if seizures are causing blackouts. Another possible cause of blackouts could be related to autonomic dysfunction.  - Maintained on depakote twice daily, lamictal 150mg  BID, vimpat 200mg  BID - Following with neurology   60 mins spent on case; >50% face to face with patient   , NP 12/21/2021

## 2021-12-26 ENCOUNTER — Telehealth: Payer: Self-pay | Admitting: Primary Care

## 2021-12-26 NOTE — Telephone Encounter (Signed)
Lm for creative directions.

## 2021-12-26 NOTE — Telephone Encounter (Signed)
Reviewed case with Dr. Craige Cotta. Please let patient's facility know he will need to hold zoloft and recommend he hold zyrtec for 1 week prior to his sleep test. Monitor for mood changes and notify us if this happens. Thank you

## 2021-12-26 NOTE — Telephone Encounter (Signed)
Attempted to call pt's legal guardian Victorino Dike but unable to reach. Left message for her to return call.

## 2021-12-27 NOTE — Telephone Encounter (Signed)
Spoke to Azzie Roup, director of creative directions and relayed below recommendations. She voiced her understanding.  Nothing further needed.

## 2021-12-29 NOTE — Progress Notes (Signed)
Reviewed and agree with assessment/plan.   Saman Giddens, MD [facility] 12/29/2021, 2:30 PM Pager:  [phone]  

## 2022-02-03 ENCOUNTER — Emergency Department: Payer: Medicare Other

## 2022-02-03 ENCOUNTER — Encounter: Payer: Self-pay | Admitting: Emergency Medicine

## 2022-02-03 ENCOUNTER — Inpatient Hospital Stay: Payer: Medicare Other

## 2022-02-03 ENCOUNTER — Other Ambulatory Visit: Payer: Self-pay

## 2022-02-03 ENCOUNTER — Inpatient Hospital Stay
Admission: EM | Admit: 2022-02-03 | Discharge: 2022-02-06 | DRG: 193 | Payer: Medicare Other | Attending: Internal Medicine | Admitting: Internal Medicine

## 2022-02-03 DIAGNOSIS — E039 Hypothyroidism, unspecified: Secondary | ICD-10-CM | POA: Diagnosis present

## 2022-02-03 DIAGNOSIS — F39 Unspecified mood [affective] disorder: Secondary | ICD-10-CM | POA: Diagnosis present

## 2022-02-03 DIAGNOSIS — Z87891 Personal history of nicotine dependence: Secondary | ICD-10-CM

## 2022-02-03 DIAGNOSIS — J9601 Acute respiratory failure with hypoxia: Secondary | ICD-10-CM | POA: Diagnosis not present

## 2022-02-03 DIAGNOSIS — Z888 Allergy status to other drugs, medicaments and biological substances status: Secondary | ICD-10-CM | POA: Diagnosis not present

## 2022-02-03 DIAGNOSIS — R4189 Other symptoms and signs involving cognitive functions and awareness: Secondary | ICD-10-CM | POA: Diagnosis present

## 2022-02-03 DIAGNOSIS — G40909 Epilepsy, unspecified, not intractable, without status epilepticus: Secondary | ICD-10-CM | POA: Diagnosis present

## 2022-02-03 DIAGNOSIS — Z880 Allergy status to penicillin: Secondary | ICD-10-CM | POA: Diagnosis not present

## 2022-02-03 DIAGNOSIS — Z79890 Hormone replacement therapy (postmenopausal): Secondary | ICD-10-CM | POA: Diagnosis not present

## 2022-02-03 DIAGNOSIS — R4587 Impulsiveness: Secondary | ICD-10-CM | POA: Diagnosis present

## 2022-02-03 DIAGNOSIS — R0902 Hypoxemia: Secondary | ICD-10-CM | POA: Diagnosis present

## 2022-02-03 DIAGNOSIS — Z1152 Encounter for screening for COVID-19: Secondary | ICD-10-CM

## 2022-02-03 DIAGNOSIS — G9389 Other specified disorders of brain: Secondary | ICD-10-CM | POA: Diagnosis present

## 2022-02-03 DIAGNOSIS — R569 Unspecified convulsions: Secondary | ICD-10-CM

## 2022-02-03 DIAGNOSIS — G9341 Metabolic encephalopathy: Secondary | ICD-10-CM | POA: Diagnosis not present

## 2022-02-03 DIAGNOSIS — Z20822 Contact with and (suspected) exposure to covid-19: Secondary | ICD-10-CM | POA: Diagnosis present

## 2022-02-03 DIAGNOSIS — S069X9S Unspecified intracranial injury with loss of consciousness of unspecified duration, sequela: Secondary | ICD-10-CM | POA: Diagnosis not present

## 2022-02-03 DIAGNOSIS — R5383 Other fatigue: Secondary | ICD-10-CM | POA: Diagnosis not present

## 2022-02-03 DIAGNOSIS — I2699 Other pulmonary embolism without acute cor pulmonale: Secondary | ICD-10-CM | POA: Diagnosis present

## 2022-02-03 DIAGNOSIS — Z886 Allergy status to analgesic agent status: Secondary | ICD-10-CM

## 2022-02-03 DIAGNOSIS — E876 Hypokalemia: Secondary | ICD-10-CM | POA: Diagnosis not present

## 2022-02-03 DIAGNOSIS — J189 Pneumonia, unspecified organism: Secondary | ICD-10-CM | POA: Diagnosis present

## 2022-02-03 DIAGNOSIS — R296 Repeated falls: Secondary | ICD-10-CM | POA: Diagnosis present

## 2022-02-03 DIAGNOSIS — S069XAA Unspecified intracranial injury with loss of consciousness status unknown, initial encounter: Secondary | ICD-10-CM | POA: Diagnosis present

## 2022-02-03 DIAGNOSIS — S069XAS Unspecified intracranial injury with loss of consciousness status unknown, sequela: Secondary | ICD-10-CM | POA: Diagnosis not present

## 2022-02-03 DIAGNOSIS — Z79899 Other long term (current) drug therapy: Secondary | ICD-10-CM

## 2022-02-03 LAB — COMPREHENSIVE METABOLIC PANEL
ALT: 25 U/L (ref 0–44)
AST: 46 U/L — ABNORMAL HIGH (ref 15–41)
Albumin: 4.3 g/dL (ref 3.5–5.0)
Alkaline Phosphatase: 62 U/L (ref 38–126)
Anion gap: 12 (ref 5–15)
BUN: 7 mg/dL (ref 6–20)
CO2: 25 mmol/L (ref 22–32)
Calcium: 9.5 mg/dL (ref 8.9–10.3)
Chloride: 107 mmol/L (ref 98–111)
Creatinine, Ser: 1.07 mg/dL (ref 0.61–1.24)
GFR, Estimated: >60 mL/min (ref >60)
Glucose, Bld: 97 mg/dL (ref 70–99)
Potassium: 3.8 mmol/L (ref 3.5–5.1)
Sodium: 144 mmol/L (ref 135–145)
Total Bilirubin: 0.7 mg/dL (ref 0.3–1.2)
Total Protein: 8.1 g/dL (ref 6.5–8.1)

## 2022-02-03 LAB — URINALYSIS, COMPLETE (UACMP) WITH MICROSCOPIC
Bilirubin Urine: NEGATIVE
Glucose, UA: NEGATIVE mg/dL
Hgb urine dipstick: NEGATIVE
Ketones, ur: NEGATIVE mg/dL
Leukocytes,Ua: NEGATIVE
Nitrite: NEGATIVE
Protein, ur: 100 mg/dL — AB
Specific Gravity, Urine: 1.027 (ref 1.005–1.030)
pH: 6 (ref 5.0–8.0)

## 2022-02-03 LAB — CBG MONITORING, ED: Glucose-Capillary: 104 mg/dL — ABNORMAL HIGH (ref 70–99)

## 2022-02-03 LAB — CBC
HCT: 45.2 % (ref 39.0–52.0)
Hemoglobin: 14.3 g/dL (ref 13.0–17.0)
MCH: 30.1 pg (ref 26.0–34.0)
MCHC: 31.6 g/dL (ref 30.0–36.0)
MCV: 95.2 fL (ref 80.0–100.0)
Platelets: 357 10*3/uL (ref 150–400)
RBC: 4.75 MIL/uL (ref 4.22–5.81)
RDW: 13.8 % (ref 11.5–15.5)
WBC: 5.2 10*3/uL (ref 4.0–10.5)
nRBC: 0 % (ref 0.0–0.2)

## 2022-02-03 LAB — LACTIC ACID, PLASMA
Lactic Acid, Venous: 1.5 mmol/L (ref 0.5–1.9)
Lactic Acid, Venous: 1.5 mmol/L (ref 0.5–1.9)

## 2022-02-03 LAB — PROTIME-INR
INR: 1 (ref 0.8–1.2)
Prothrombin Time: 12.9 seconds (ref 11.4–15.2)

## 2022-02-03 LAB — SARS CORONAVIRUS 2 BY RT PCR: SARS Coronavirus 2 by RT PCR: NEGATIVE

## 2022-02-03 LAB — APTT: aPTT: 29 seconds (ref 24–36)

## 2022-02-03 MED ORDER — CLOZAPINE 100 MG PO TABS
200.0000 mg | ORAL_TABLET | Freq: Every evening | ORAL | Status: DC
  Filled 2022-02-03: qty 2

## 2022-02-03 MED ORDER — PALIPERIDONE ER 3 MG PO TB24
6.0000 mg | ORAL_TABLET | Freq: Every day | ORAL | Status: DC
  Administered 2022-02-04 – 2022-02-06 (×3): 6 mg via ORAL
  Filled 2022-02-03 (×3): qty 2

## 2022-02-03 MED ORDER — INFLUENZA VAC SPLIT QUAD 0.5 ML IM SUSY
0.5000 mL | PREFILLED_SYRINGE | INTRAMUSCULAR | Status: DC
  Filled 2022-02-03: qty 0.5

## 2022-02-03 MED ORDER — SODIUM CHLORIDE 0.9 % IV SOLN: INTRAVENOUS | Status: DC

## 2022-02-03 MED ORDER — LACOSAMIDE 50 MG PO TABS
200.0000 mg | ORAL_TABLET | Freq: Two times a day (BID) | ORAL | Status: DC
  Administered 2022-02-03 – 2022-02-06 (×6): 200 mg via ORAL
  Filled 2022-02-03 (×6): qty 4

## 2022-02-03 MED ORDER — ACETAMINOPHEN 500 MG PO TABS: 500.0000 mg | ORAL_TABLET | Freq: Four times a day (QID) | ORAL | Status: DC | PRN

## 2022-02-03 MED ORDER — MIRABEGRON ER 50 MG PO TB24
50.0000 mg | ORAL_TABLET | Freq: Every day | ORAL | Status: DC
  Administered 2022-02-04 – 2022-02-05 (×2): 50 mg via ORAL
  Filled 2022-02-03 (×2): qty 1

## 2022-02-03 MED ORDER — LEVOTHYROXINE SODIUM 25 MCG PO TABS
25.0000 ug | ORAL_TABLET | Freq: Every day | ORAL | Status: DC
  Administered 2022-02-04 – 2022-02-06 (×3): 25 ug via ORAL
  Filled 2022-02-03 (×3): qty 1

## 2022-02-03 MED ORDER — PANTOPRAZOLE SODIUM 40 MG PO TBEC
40.0000 mg | DELAYED_RELEASE_TABLET | Freq: Every day | ORAL | Status: DC
  Administered 2022-02-04 – 2022-02-06 (×3): 40 mg via ORAL
  Filled 2022-02-03 (×3): qty 1

## 2022-02-03 MED ORDER — DIVALPROEX SODIUM 500 MG PO DR TAB
500.0000 mg | DELAYED_RELEASE_TABLET | Freq: Two times a day (BID) | ORAL | Status: DC
  Administered 2022-02-03 – 2022-02-06 (×6): 500 mg via ORAL
  Filled 2022-02-03 (×6): qty 1

## 2022-02-03 MED ORDER — LORATADINE 10 MG PO TABS
10.0000 mg | ORAL_TABLET | Freq: Every day | ORAL | Status: DC
  Administered 2022-02-04 – 2022-02-06 (×3): 10 mg via ORAL
  Filled 2022-02-03 (×3): qty 1

## 2022-02-03 MED ORDER — TRAZODONE HCL 50 MG PO TABS
50.0000 mg | ORAL_TABLET | Freq: Every day | ORAL | Status: DC
  Administered 2022-02-03 – 2022-02-05 (×3): 50 mg via ORAL
  Filled 2022-02-03 (×3): qty 1

## 2022-02-03 MED ORDER — DIVALPROEX SODIUM 250 MG PO DR TAB
250.0000 mg | DELAYED_RELEASE_TABLET | Freq: Two times a day (BID) | ORAL | Status: DC
  Administered 2022-02-03 – 2022-02-06 (×6): 250 mg via ORAL
  Filled 2022-02-03 (×7): qty 1

## 2022-02-03 MED ORDER — ONDANSETRON HCL 4 MG PO TABS: 4.0000 mg | ORAL_TABLET | Freq: Four times a day (QID) | ORAL | Status: DC | PRN

## 2022-02-03 MED ORDER — ENOXAPARIN SODIUM 100 MG/ML IJ SOSY
1.0000 mg/kg | PREFILLED_SYRINGE | Freq: Two times a day (BID) | INTRAMUSCULAR | Status: DC
  Administered 2022-02-03 – 2022-02-04 (×2): 97.5 mg via SUBCUTANEOUS
  Filled 2022-02-03 (×2): qty 0.97

## 2022-02-03 MED ORDER — SODIUM CHLORIDE 0.9 % IV BOLUS
1000.0000 mL | Freq: Once | INTRAVENOUS | Status: AC
  Administered 2022-02-03: 1000 mL via INTRAVENOUS

## 2022-02-03 MED ORDER — SERTRALINE HCL 50 MG PO TABS: 50.0000 mg | ORAL_TABLET | Freq: Every day | ORAL | Status: DC

## 2022-02-03 MED ORDER — ONDANSETRON HCL 4 MG/2ML IJ SOLN: 4.0000 mg | Freq: Four times a day (QID) | INTRAMUSCULAR | Status: DC | PRN

## 2022-02-03 MED ORDER — LAMOTRIGINE 100 MG PO TABS
50.0000 mg | ORAL_TABLET | Freq: Two times a day (BID) | ORAL | Status: DC
  Administered 2022-02-03 – 2022-02-06 (×6): 50 mg via ORAL
  Filled 2022-02-03 (×6): qty 1

## 2022-02-03 MED ORDER — SODIUM CHLORIDE 0.9 % IV SOLN
2.0000 g | INTRAVENOUS | Status: DC
  Administered 2022-02-04 – 2022-02-05 (×2): 2 g via INTRAVENOUS
  Filled 2022-02-03: qty 2
  Filled 2022-02-03: qty 20
  Filled 2022-02-03: qty 2

## 2022-02-03 MED ORDER — FENOFIBRATE 54 MG PO TABS
54.0000 mg | ORAL_TABLET | Freq: Every day | ORAL | Status: DC
  Administered 2022-02-04 – 2022-02-06 (×3): 54 mg via ORAL
  Filled 2022-02-03 (×3): qty 1

## 2022-02-03 MED ORDER — SODIUM CHLORIDE 0.9 % IV SOLN
1.0000 g | Freq: Once | INTRAVENOUS | Status: AC
  Administered 2022-02-03: 1 g via INTRAVENOUS
  Filled 2022-02-03: qty 10

## 2022-02-03 MED ORDER — SODIUM CHLORIDE 0.9 % IV SOLN
500.0000 mg | Freq: Once | INTRAVENOUS | Status: AC
  Administered 2022-02-03: 500 mg via INTRAVENOUS
  Filled 2022-02-03: qty 5

## 2022-02-03 MED ORDER — ADULT MULTIVITAMIN W/MINERALS CH
1.0000 | ORAL_TABLET | Freq: Every day | ORAL | Status: DC
  Administered 2022-02-04 – 2022-02-06 (×3): 1 via ORAL
  Filled 2022-02-03 (×3): qty 1

## 2022-02-03 MED ORDER — POLYVINYL ALCOHOL 1.4 % OP SOLN
2.0000 [drp] | Freq: Four times a day (QID) | OPHTHALMIC | Status: DC
  Administered 2022-02-04 – 2022-02-05 (×4): 2 [drp] via OPHTHALMIC
  Filled 2022-02-03: qty 15

## 2022-02-03 MED ORDER — MELATONIN 5 MG PO TABS
10.0000 mg | ORAL_TABLET | Freq: Every day | ORAL | Status: DC
  Administered 2022-02-03 – 2022-02-05 (×3): 10 mg via ORAL
  Filled 2022-02-03 (×3): qty 2

## 2022-02-03 MED ORDER — CLOZAPINE 100 MG PO TABS: 200.0000 mg | ORAL_TABLET | Freq: Every day | ORAL | Status: DC

## 2022-02-03 MED ORDER — ENOXAPARIN SODIUM 40 MG/0.4ML IJ SOSY: 40.0000 mg | PREFILLED_SYRINGE | INTRAMUSCULAR | Status: DC

## 2022-02-03 MED ORDER — CLOZAPINE 25 MG PO TABS
75.0000 mg | ORAL_TABLET | Freq: Every day | ORAL | Status: DC
  Administered 2022-02-04 – 2022-02-06 (×3): 75 mg via ORAL
  Filled 2022-02-03 (×3): qty 3

## 2022-02-03 MED ORDER — DOCUSATE SODIUM 100 MG PO CAPS
100.0000 mg | ORAL_CAPSULE | Freq: Two times a day (BID) | ORAL | Status: DC
  Administered 2022-02-03 – 2022-02-06 (×6): 100 mg via ORAL
  Filled 2022-02-03 (×6): qty 1

## 2022-02-03 MED ORDER — SERTRALINE HCL 50 MG PO TABS
150.0000 mg | ORAL_TABLET | Freq: Every day | ORAL | Status: DC
  Administered 2022-02-03 – 2022-02-05 (×3): 150 mg via ORAL
  Filled 2022-02-03 (×3): qty 3

## 2022-02-03 MED ORDER — SODIUM CHLORIDE 0.9 % IV SOLN
500.0000 mg | INTRAVENOUS | Status: DC
  Administered 2022-02-04 – 2022-02-05 (×2): 500 mg via INTRAVENOUS
  Filled 2022-02-03: qty 5
  Filled 2022-02-03 (×2): qty 500
  Filled 2022-02-03: qty 5

## 2022-02-03 MED ORDER — CLOZAPINE 100 MG PO TABS
200.0000 mg | ORAL_TABLET | Freq: Every day | ORAL | Status: DC
  Administered 2022-02-03 – 2022-02-05 (×3): 200 mg via ORAL
  Filled 2022-02-03 (×4): qty 2

## 2022-02-03 MED ORDER — IOHEXOL 350 MG/ML SOLN
100.0000 mL | Freq: Once | INTRAVENOUS | Status: AC | PRN
  Administered 2022-02-03: 100 mL via INTRAVENOUS

## 2022-02-03 MED ORDER — PROPRANOLOL HCL 10 MG PO TABS
10.0000 mg | ORAL_TABLET | Freq: Every day | ORAL | Status: DC
  Administered 2022-02-04 – 2022-02-06 (×3): 10 mg via ORAL
  Filled 2022-02-03 (×3): qty 1

## 2022-02-03 NOTE — ED Notes (Signed)
PT out of dept for testing. Will be taken to inpatient room when he gets back.

## 2022-02-03 NOTE — Consult Note (Addendum)
ANTICOAGULATION CONSULT NOTE - Initial Consult  Pharmacy Consult for subQ enoxaparin  Indication: pulmonary embolus  Allergies  Allergen Reactions   Antihistamines, Diphenhydramine-Type Other (See Comments)    UNK reaction   Aspirin Other (See Comments)    UNK reaction   Nsaids Other (See Comments)    Unk reaction   Prednisone Other (See Comments)    UNK reaction   Penicillins Rash    Patient Measurements: Height: 6' (182.9 cm) Weight: 96.6 kg (212 lb 15.4 oz) IBW/kg (Calculated) : 77.6 Heparin Dosing Weight: 96.6kg  Vital Signs: Temp: 98 F (36.7 C) (10/01 1607) Temp Source: Oral (10/01 1607) BP: 133/86 (10/01 1607) Pulse Rate: 95 (10/01 1607)  Labs: Recent Labs    02/03/22 1113  HGB 14.3  HCT 45.2  PLT 357  APTT 29  LABPROT 12.9  INR 1.0  CREATININE 1.07    Estimated Creatinine Clearance: 112.8 mL/min (by C-G formula based on SCr of 1.07 mg/dL).   Medical History: Past Medical History:  Diagnosis Date   Seizures ([place])    TBI (traumatic brain injury) ([place])     Medications:  PTA: No AP/AC prior to admission Inpatient: Therapeutic lovenox Allergies: unknown reaction to aspirin  Assessment: 36 y.o. male with medical history significant for traumatic brain injury and seizures who presents with poor oral intake associated with pulse oximetry in the 70s on room air which improved with 2L supplemental oxygen. CT angiogram shows small volume acute pulmonary emboli. Pharmacy consulted for therapeutic lovenox in the setting of acute PE.  Goal of Therapy:  Anti-Xa level 0.6-1 units/ml 4hrs after LMWH dose given Monitor platelets by anticoagulation protocol: Yes   Plan:  Give enoxaprin 50 units subQ every 12 hours Continue to monitor H&H and platelets daily while on heparin gtt.  Darrick Penna 02/03/2022,5:25 PM

## 2022-02-03 NOTE — ED Notes (Signed)
Informed RN bed assigned 

## 2022-02-03 NOTE — Assessment & Plan Note (Signed)
History of traumatic brain injury Patient noted to be very impulsive and agitated and has to be redirected frequently. He will need a Air cabin crew Discussed with his caregiver at the bedside who plans on staying but will also reach out to the director of the group home to provide some assistance.

## 2022-02-03 NOTE — ED Provider Notes (Signed)
[facility] Provider Note   Event Date/Time   First MD Initiated Contact with Patient 02/03/22 1057     (approximate) History  Weakness  HPI Eddie Berry is a 36 y.o. male with a history of traumatic brain injury and minimally responsive at baseline who presents for decreased p.o. intake, productive cough, and altered mental status over the past 3 days.  Patient's caregiver at bedside states that he has had significantly decreased p.o. intake with a "coughing up white stuff" over the past 3 days.  Further history and review of systems are unable to be obtained at this time given patient's mental status.   Physical Exam  Triage Vital Signs: ED Triage Vitals  Enc Vitals Group     BP 02/03/22 1052 97/84     Pulse Rate 02/03/22 1052 (!) 110     Resp 02/03/22 1052 18     Temp 02/03/22 1052 97.8 F (36.6 C)     Temp Source 02/03/22 1052 Oral     SpO2 02/03/22 1052 (!) 88 %     Weight 02/03/22 1051 212 lb 15.4 oz (96.6 kg)     Height 02/03/22 1051 6' (1.829 m)     Head Circumference --      Peak Flow --      Pain Score --      Pain Loc --      Pain Edu? --      Excl. in [place]? --    Most recent vital signs: Vitals:   02/03/22 1300 02/03/22 1400  BP: 139/82 (!) 121/96  Pulse: (!) 103 93  Resp:  18  Temp:    SpO2: 98% 99%   General: Awake, cooperative CV:  Good peripheral perfusion.  Resp:  Normal effort.  Cough with productive of white sputum Abd:  No distention. Other:  Middle-aged overweight Caucasian male laying in bed in no acute distress.  Mild diaphoresis ED Results / Procedures / Treatments  Labs (all labs ordered are listed, but only abnormal results are displayed) Labs Reviewed  COMPREHENSIVE METABOLIC PANEL - Abnormal; Notable for the following components:      Result Value   AST 46 (*)    All other components within normal limits  URINALYSIS, COMPLETE (UACMP) WITH MICROSCOPIC - Abnormal; Notable for the following components:   Color,  Urine AMBER (*)    APPearance CLOUDY (*)    Protein, ur 100 (*)    Bacteria, UA RARE (*)    All other components within normal limits  CBG MONITORING, ED - Abnormal; Notable for the following components:   Glucose-Capillary 104 (*)    All other components within normal limits  SARS CORONAVIRUS 2 BY RT PCR  CULTURE, BLOOD (ROUTINE X 2)  CULTURE, BLOOD (ROUTINE X 2)  URINE CULTURE  CBC  LACTIC ACID, PLASMA  PROTIME-INR  APTT  LACTIC ACID, PLASMA   EKG ED ECG REPORT I, Naaman Plummer, the attending physician, personally viewed and interpreted this ECG. Date: 02/03/2022 EKG Time: 1058 Rate: 100 Rhythm: Tachycardic sinus rhythm QRS Axis: normal Intervals: normal ST/T Wave abnormalities: normal Narrative Interpretation: Tachycardic sinus rhythm.  No evidence of acute ischemia RADIOLOGY ED MD interpretation: Single view portable chest x-ray shows right lower lobe atelectasis versus infiltrate -Agree with radiology assessment Official radiology report(s): DG Chest Port 1 View  Result Date: 02/03/2022 CLINICAL DATA:  Questionable sepsis EXAM: PORTABLE CHEST 1 VIEW COMPARISON:  None Available. FINDINGS: Normal cardiac silhouette. Linear opacity over the RIGHT hemidiaphragm.  LEFT lung clear. No pneumothorax. No acute osseous abnormality. IMPRESSION: RIGHT lower lobe atelectasis versus infiltrate. Electronically Signed   By: Suzy Bouchard M.D.   On: 02/03/2022 11:29   PROCEDURES: Critical Care performed: Yes, see critical care procedure note(s) .1-3 Lead EKG Interpretation  Performed by: Naaman Plummer, MD Authorized by: Naaman Plummer, MD     Interpretation: normal     ECG rate:  94   ECG rate assessment: normal     Rhythm: sinus rhythm     Ectopy: none     Conduction: normal   CRITICAL CARE Performed by: Naaman Plummer  Total critical care time: 33 minutes  Critical care time was exclusive of separately billable procedures and treating other patients.  Critical care  was necessary to treat or prevent imminent or life-threatening deterioration.  Critical care was time spent personally by me on the following activities: development of treatment plan with patient and/or surrogate as well as nursing, discussions with consultants, evaluation of patient's response to treatment, examination of patient, obtaining history from patient or surrogate, ordering and performing treatments and interventions, ordering and review of laboratory studies, ordering and review of radiographic studies, pulse oximetry and re-evaluation of patient's condition.  MEDICATIONS ORDERED IN ED: Medications  cefTRIAXone (ROCEPHIN) 1 g in sodium chloride 0.9 % 100 mL IVPB (has no administration in time range)  azithromycin (ZITHROMAX) 500 mg in sodium chloride 0.9 % 250 mL IVPB (has no administration in time range)  sodium chloride 0.9 % bolus 1,000 mL (has no administration in time range)   IMPRESSION / MDM / ASSESSMENT AND PLAN / ED COURSE  I reviewed the triage vital signs and the nursing notes.                             The patient is on the cardiac monitor to evaluate for evidence of arrhythmia and/or significant heart rate changes. Patient's presentation is most consistent with acute presentation with potential threat to life or bodily function. Presents with decreased p.o. intake, cough, and malaise concerning for pneumonia.  DDx: PE, COPD exacerbation, Pneumothorax, TB, Atypical ACS, Esophageal Rupture, Toxic Exposure, Foreign Body Airway Obstruction.  Workup: CXR CBC, CMP, lactate, troponin  Given History, Exam, and Workup presentation most consistent with pneumonia.  Findings: Right lower lobe atelectasis versus pneumonia  Tx: Ceftriaxone 1g IV Azithromycin 500mg  IV  1426 Reassessment: As patient is continuing to require supplemental oxygenation for acute hypoxic respiratory failure, patient will require admission to the internal medicine service for further evaluation  and management  Disposition: Admit   FINAL CLINICAL IMPRESSION(S) / ED DIAGNOSES   Final diagnoses:  Community acquired pneumonia of right lower lobe of lung  Acute respiratory failure with hypoxia ([place])   Rx / DC Orders   ED Discharge Orders     None      Note:  This document was prepared using Dragon voice recognition software and may include unintentional dictation errors.   Naaman Plummer, MD 02/03/22 [phone]

## 2022-02-03 NOTE — Assessment & Plan Note (Signed)
Continue Clozaril, trazodone, Invega and sertraline.  With lethargy this morning will ask psychiatry to see the patient

## 2022-02-03 NOTE — Assessment & Plan Note (Signed)
Stable Continue Synthroid 

## 2022-02-03 NOTE — Assessment & Plan Note (Signed)
Place patient on seizure precautions Continue Depakote and Lamictal

## 2022-02-03 NOTE — H&P (Addendum)
History and Physical    Patient: Eddie Berry PXT:[id number] DOB: Sep 30, 1985 DOA: 02/03/2022 DOS: the patient was seen and examined on 02/03/2022 PCP: Anselm Jungling, NP  Patient coming from: Group Home  Chief Complaint:  Chief Complaint  Patient presents with   Weakness    Most of the history was obtained from patient's caregiver at the bedside HPI: [name] is a 36 y.o. male with medical history significant for traumatic brain injury and seizures who presents with his caregiver for evaluation of poor oral intake associated with weakness and an unsteady gait. According to his caregiver 3 weeks ago he had what they were told was a respiratory infection after he was taken to an urgent care center for evaluation of a productive cough and was treated with a course of antibiotics.  He states that his symptoms resolved and then 3 days ago patient started having poor oral intake and has not been eating or drinking like he normally would.  He states that he has been very weak but has not had any falls. He currently does not have a cough and per his caregiver they have not noted any difficulty swallowing or choking episodes. Patient denies having any nausea, no vomiting, no changes in his bowel habits, no abdominal pain, no leg swelling, no urinary symptoms, no blurred vision no focal deficit. In the ER he was noted to be hypoxic with room air pulse oximetry in the 70s and is currently on 2 L of oxygen with improvement in his pulse oximetry to 99%. Chest x-ray shows right lower lobe atelectasis versus infiltrate.     Review of Systems: As mentioned in the history of present illness. All other systems reviewed and are negative. Past Medical History:  Diagnosis Date   Seizures (HCC)    TBI (traumatic brain injury) (HCC)    Past Surgical History:  Procedure Laterality Date   ANKLE SURGERY     APPENDECTOMY     BRAIN SURGERY     LUNG SURGERY     Social History:  reports that he quit  smoking about 20 months ago. His smoking use included cigarettes. He has a 22.00 pack-year smoking history. He has never used smokeless tobacco. He reports that he does not currently use alcohol. He reports that he does not currently use drugs.  Allergies  Allergen Reactions   Antihistamines, Diphenhydramine-Type Other (See Comments)    UNK reaction   Aspirin Other (See Comments)    UNK reaction   Nsaids Other (See Comments)    Unk reaction   Prednisone Other (See Comments)    UNK reaction   Penicillins Rash    No family history on file.  Prior to Admission medications   Medication Sig Start Date End Date Taking? Authorizing Provider  cetirizine (ZYRTEC) 10 MG tablet Take 10 mg by mouth daily.   Yes [provider]  Cholecalciferol (VITAMIN D3) 50 MCG (2000 UT) TABS Take 1 tablet by mouth daily.   Yes [provider]  clozapine (CLOZARIL) 200 MG tablet Take 200 mg by mouth daily.   Yes [provider]  cloZAPine (CLOZARIL) 25 MG tablet Take 75 mg by mouth daily.   Yes [provider]  divalproex (DEPAKOTE) 250 MG DR tablet Take 250 mg by mouth 2 (two) times daily.   Yes [provider]  divalproex (DEPAKOTE) 500 MG DR tablet Take 500 mg by mouth 2 (two) times daily.   Yes [provider]  docusate sodium (COLACE) 100 MG capsule  Take 100 mg by mouth 2 (two) times daily.   Yes [provider]  fenofibrate (TRICOR) 48 MG tablet Take 48 mg by mouth daily.   Yes [provider]  fluticasone (FLONASE) 50 MCG/ACT nasal spray Place 2 sprays into both nostrils daily.   Yes [provider]  hydroxypropyl methylcellulose / hypromellose (ISOPTO TEARS / GONIOVISC) 2.5 % ophthalmic solution Place 2 drops into the right eye 4 (four) times daily.   Yes [provider]  lacosamide (VIMPAT) 200 MG TABS tablet Take 1 tablet (200 mg total) by mouth 2 (two) times daily. 07/04/21  Yes Minna Antis, MD  lamoTRIgine  (LAMICTAL) 100 MG tablet Take 50 mg by mouth 2 (two) times daily.   Yes [provider]  levothyroxine (SYNTHROID) 25 MCG tablet Take 25 mcg by mouth daily before breakfast.   Yes [provider]  mirabegron ER (MYRBETRIQ) 50 MG TB24 tablet Take 50 mg by mouth daily.   Yes [provider]  Multiple Vitamin (THEREMS PO) Take 1 tablet by mouth daily.   Yes [provider]  omeprazole (PRILOSEC) 20 MG capsule Take 20 mg by mouth daily.   Yes [provider]  paliperidone (INVEGA) 6 MG 24 hr tablet Take 6 mg by mouth daily.   Yes [provider]  propranolol (INDERAL) 10 MG tablet Take 10 mg by mouth daily.   Yes [provider]  sertraline (ZOLOFT) 100 MG tablet Take 100 mg by mouth daily.   Yes [provider]  sertraline (ZOLOFT) 50 MG tablet Take 50 mg by mouth at bedtime.   Yes [provider]  traZODone (DESYREL) 50 MG tablet Take 50 mg by mouth at bedtime.   Yes [provider]  acetaminophen (TYLENOL) 500 MG tablet Take 500 mg by mouth every 6 (six) hours as needed.    [provider]  albuterol (VENTOLIN HFA) 108 (90 Base) MCG/ACT inhaler Inhale 2 puffs into the lungs every 6 (six) hours as needed for wheezing or shortness of breath.    [provider]  azelastine (ASTELIN) 0.1 % nasal spray Place 2 sprays into both nostrils as needed for rhinitis. Use in each nostril as directed    [provider]  lamoTRIgine (LAMICTAL) 150 MG tablet Take 150 mg by mouth daily. Patient not taking: Reported on 02/03/2022    [provider]  Melatonin 10 MG TABS Take 1 tablet by mouth at bedtime.    [provider]    Physical Exam: Vitals:   02/03/22 1300 02/03/22 1400 02/03/22 1436 02/03/22 1607  BP: 139/82 (!) 121/96  133/86  Pulse: (!) 103 93  95  Resp:  18  16  Temp:   98 F (36.7 C) 98 F (36.7 C)  TempSrc:   Oral Oral  SpO2: 98% 99%  100%  Weight:      Height:        Physical Exam Vitals and nursing note reviewed.  Constitutional:      Comments: Acutely ill-appearing  HENT:     Head: Normocephalic and atraumatic.     Nose: Nose normal.     Mouth/Throat:     Mouth: Mucous membranes are dry.  Eyes:     Conjunctiva/sclera: Conjunctivae normal.  Cardiovascular:     Rate and Rhythm: Tachycardia present.  Pulmonary:     Breath sounds: Rhonchi present.     Comments: Rhonchi at the right base Abdominal:     General: Abdomen is flat. Bowel sounds  are normal.     Palpations: Abdomen is soft.  Musculoskeletal:        General: Normal range of motion.     Cervical back: Normal range of motion and neck supple.  Skin:    General: Skin is warm and dry.  Neurological:     Mental Status: He is alert.     Motor: Weakness present.  Psychiatric:     Comments: Agitated     Data Reviewed: Relevant notes from primary care and specialist visits, past discharge summaries as available in EHR, including Care Everywhere. Prior diagnostic testing as pertinent to current admission diagnoses Updated medications and problem lists for reconciliation ED course, including vitals, labs, imaging, treatment and response to treatment Triage notes, nursing and pharmacy notes and ED provider's notes Notable results as noted in HPI Labs reviewed.  Lactic acid 1.5.  Sodium 144, potassium 3.8, chloride 107, bicarb 25, glucose 97, BUN 7, creatinine 1.07, calcium 9.5, total protein 8.1, albumin 4.3, AST 46, ALT 25, alkaline phosphatase 62, total bilirubin 0.7, PT 12.9, INR 1.0, white count 5.2, hemoglobin 14.3, hematocrit 45.2, platelet count 357 SARS coronavirus 2 PCR is negative Chest x-ray reviewed by me shows right lower lobe opacity and clear left lung Twelve-lead EKG reviewed by me shows sinus tachycardia with low voltage precordial leads There are no new results to review at this time.  Assessment and Plan: * CAP (community acquired pneumonia) Patient presents to  the ER for evaluation of a 3-day history of poor oral intake, weakness, increased lethargy and unsteady gait. Noted to be hypoxic with room air pulse oximetry of 70% and is currently on 2 L of oxygen. Recently completed antibiotic therapy for respiratory tract infection Chest x-ray suggestive of a right lower lobe infiltrate but patient is afebrile and does not have a white count We will place patient empirically on Rocephin and Zithromax Speech therapy consult for swallow function evaluation Follow-up results of blood cultures  Mood disorder (HCC) Continue Clozaril, trazodone, Invega and sertraline  Acute pulmonary embolism (HCC) Patient presents to the ER for evaluation of poor oral intake, weakness and lethargy and was hypoxic with room air pulse oximetry of 70% which improved with oxygen supplementation at 2 L. CT angiogram shows small volume acute pulmonary emboli within the RIGHT middle lobe and RIGHT lower lobe pulmonary arteries. Overall tumor burden is minimal. No RIGHT ventricular strain. We will place patient on therapeutic Lovenox  Hypothyroidism Stable Continue Synthroid  Seizure [facility]) Place patient on seizure precautions Continue Depakote and Lamictal  TBI (traumatic brain injury) (HCC) History of traumatic brain injury Patient noted to be very impulsive and agitated and has to be redirected frequently. He will need a Recruitment consultant Discussed with his caregiver at the bedside who plans on staying but will also reach out to the director of the group home to provide some assistance.      Advance Care Planning:   Code Status: Full Code   Consults: Speech therapy  Family Communication: Greater than 50% of time was spent discussing patient's condition with his caregiver at the bedside.  All questions and concerns have been addressed.  He verbalizes understanding and agrees with the plan.  Severity of Illness: The appropriate patient status for this patient is  INPATIENT. Inpatient status is judged to be reasonable and necessary in order to provide the required intensity of service to ensure the patient's safety. The patient's presenting symptoms, physical exam findings, and initial radiographic and laboratory data in the context  of their chronic comorbidities is felt to place them at high risk for further clinical deterioration. Furthermore, it is not anticipated that the patient will be medically stable for discharge from the hospital within 2 midnights of admission.   * I certify that at the point of admission it is my clinical judgment that the patient will require inpatient hospital care spanning beyond 2 midnights from the point of admission due to high intensity of service, high risk for further deterioration and high frequency of surveillance required.*  Author: Collier Bullock, MD 02/03/2022 4:59 PM  For on call review [email or web address].

## 2022-02-03 NOTE — Assessment & Plan Note (Addendum)
Right lower lobe pneumonia on Rocephin and Zithromax.

## 2022-02-03 NOTE — ED Notes (Signed)
Pt de sat into the low 70's while on RA. RN placed pt on 2l/min via [place]. Provider notified

## 2022-02-03 NOTE — ED Triage Notes (Signed)
Pt here with caregiver. Caregiver reports for the last 3 days has not been eating or drinking and has been really weak

## 2022-02-03 NOTE — ED Notes (Signed)
Advised nurse that a patient has assigned bed

## 2022-02-03 NOTE — Assessment & Plan Note (Signed)
Patient presents to the ER for evaluation of poor oral intake, weakness and lethargy and was hypoxic with room air pulse oximetry of 70% which improved with oxygen supplementation at 2 L. CT angiogram shows small volume acute pulmonary emboli within the RIGHT middle lobe and RIGHT lower lobe pulmonary arteries. Overall tumor burden is minimal. No RIGHT ventricular strain. We will place patient on therapeutic Lovenox

## 2022-02-04 ENCOUNTER — Inpatient Hospital Stay: Payer: Medicare Other

## 2022-02-04 DIAGNOSIS — E039 Hypothyroidism, unspecified: Secondary | ICD-10-CM

## 2022-02-04 DIAGNOSIS — J189 Pneumonia, unspecified organism: Secondary | ICD-10-CM | POA: Diagnosis not present

## 2022-02-04 DIAGNOSIS — I2699 Other pulmonary embolism without acute cor pulmonale: Secondary | ICD-10-CM

## 2022-02-04 DIAGNOSIS — J9601 Acute respiratory failure with hypoxia: Secondary | ICD-10-CM | POA: Diagnosis not present

## 2022-02-04 DIAGNOSIS — F39 Unspecified mood [affective] disorder: Secondary | ICD-10-CM | POA: Diagnosis not present

## 2022-02-04 LAB — CBC
HCT: 39 % (ref 39.0–52.0)
Hemoglobin: 12.5 g/dL — ABNORMAL LOW (ref 13.0–17.0)
MCH: 30.3 pg (ref 26.0–34.0)
MCHC: 32.1 g/dL (ref 30.0–36.0)
MCV: 94.7 fL (ref 80.0–100.0)
Platelets: 334 10*3/uL (ref 150–400)
RBC: 4.12 MIL/uL — ABNORMAL LOW (ref 4.22–5.81)
RDW: 13.9 % (ref 11.5–15.5)
WBC: 5.5 10*3/uL (ref 4.0–10.5)
nRBC: 0 % (ref 0.0–0.2)

## 2022-02-04 LAB — BASIC METABOLIC PANEL
Anion gap: 7 (ref 5–15)
BUN: 9 mg/dL (ref 6–20)
CO2: 26 mmol/L (ref 22–32)
Calcium: 8.8 mg/dL — ABNORMAL LOW (ref 8.9–10.3)
Chloride: 110 mmol/L (ref 98–111)
Creatinine, Ser: 0.78 mg/dL (ref 0.61–1.24)
GFR, Estimated: >60 mL/min (ref >60)
Glucose, Bld: 96 mg/dL (ref 70–99)
Potassium: 3.4 mmol/L — ABNORMAL LOW (ref 3.5–5.1)
Sodium: 143 mmol/L (ref 135–145)

## 2022-02-04 LAB — RESPIRATORY PANEL BY PCR

## 2022-02-04 LAB — URINE CULTURE: Culture: NO GROWTH

## 2022-02-04 LAB — HIV ANTIBODY (ROUTINE TESTING W REFLEX): HIV Screen 4th Generation wRfx: NONREACTIVE

## 2022-02-04 MED ORDER — APIXABAN 5 MG PO TABS
10.0000 mg | ORAL_TABLET | Freq: Two times a day (BID) | ORAL | Status: DC
  Administered 2022-02-04 – 2022-02-06 (×4): 10 mg via ORAL
  Filled 2022-02-04 (×4): qty 2

## 2022-02-04 MED ORDER — POTASSIUM CHLORIDE CRYS ER 20 MEQ PO TBCR
20.0000 meq | EXTENDED_RELEASE_TABLET | Freq: Once | ORAL | Status: AC
  Administered 2022-02-04: 20 meq via ORAL
  Filled 2022-02-04: qty 1

## 2022-02-04 MED ORDER — ENSURE ENLIVE PO LIQD
237.0000 mL | Freq: Three times a day (TID) | ORAL | Status: DC
  Administered 2022-02-04 – 2022-02-06 (×5): 237 mL via ORAL

## 2022-02-04 MED ORDER — APIXABAN 5 MG PO TABS: 5.0000 mg | ORAL_TABLET | Freq: Two times a day (BID) | ORAL | Status: DC

## 2022-02-04 NOTE — Assessment & Plan Note (Addendum)
Pulse ox of 70% as per admitting physician.  I did not see a pulse ox of 88% on presentation also.  Patient was on 2 L of oxygen and able to come off oxygen with a pulse ox of 90%.  Will get an overnight oximetry

## 2022-02-04 NOTE — Consult Note (Signed)
[place] for apixaban  Indication: pulmonary embolus  Allergies  Allergen Reactions   Antihistamines, Diphenhydramine-Type Other (See Comments)    UNK reaction   Aspirin Other (See Comments)    UNK reaction   Nsaids Other (See Comments)    Unk reaction   Prednisone Other (See Comments)    UNK reaction   Penicillins Rash    Patient Measurements: Height: 6' (182.9 cm) Weight: 96.6 kg (212 lb 15.4 oz) IBW/kg (Calculated) : 77.6   Vital Signs: Temp: 98.8 F (37.1 C) (10/02 1136) Temp Source: Oral (10/02 0546) BP: 105/73 (10/02 1136) Pulse Rate: 106 (10/02 1136)  Labs: Recent Labs    02/03/22 1113 02/04/22 0527  HGB 14.3 12.5*  HCT 45.2 39.0  PLT 357 334  APTT 29  --   LABPROT 12.9  --   INR 1.0  --   CREATININE 1.07 0.78     Estimated Creatinine Clearance: 150.9 mL/min (by C-G formula based on SCr of 0.78 mg/dL).   Medical History: Past Medical History:  Diagnosis Date   Seizures ([place])    TBI (traumatic brain injury) ([place])     Medications:  PTA: No AP/AC prior to admission Inpatient: Therapeutic lovenox 10/1>>10/2 AM dose  Allergies: unknown reaction to aspirin  Assessment: 36 y.o. male with medical history significant for traumatic brain injury and seizures.  CT angiogram shows small volume acute pulmonary emboli. Pharmacy consulted for apixaban in the setting of acute PE.  Goal of Therapy:  Monitor platelets by anticoagulation protocol: Yes   Plan:  Discontinue therapeutic Lovenox.   Begin apixaban 10mg  BID x 14 doses beginning today at 2200, then apixaban 5mg  BID.    Alison Murray 02/04/2022,1:37 PM

## 2022-02-04 NOTE — Progress Notes (Signed)
  Progress Note   Patient: Eddie Berry TOI:[id number] DOB: 23-Jan-1986 DOA: 02/03/2022     1 DOS: the patient was seen and examined on 02/04/2022     Assessment and Plan: * Acute pulmonary embolism ([place]) The patient was placed on Lovenox coming into the hospital to change over to Eliquis for this evening.  CT scan showed small volume acute pulmonary emboli within the right middle lobe and right lower lobe.  Ultrasound bilateral lower extremities negative for DVT.  CAP (community acquired pneumonia) Right lower lobe pneumonia on Rocephin and Zithromax.  Acute hypoxic respiratory failure (HCC) Pulse ox of 70% as per admitting physician.  I did not see a pulse ox of 88% on presentation also.  Patient was on 2 L of oxygen and able to come off oxygen with a pulse ox of 90%.  Mood disorder (HCC) Continue Clozaril, trazodone, Invega and sertraline  Hypothyroidism Continue Synthroid  Seizure ([place]) Continue Depakote and Lamictal  TBI (traumatic brain injury) ([place]) Continue to monitor closely here in the hospital        Subjective: Patient not feeling too good.  Did not eat much breakfast.  Some shortness of breath and some cough.  Being treated for pneumonia and pulmonary embolism.  Physical Exam: Vitals:   02/04/22 0546 02/04/22 0758 02/04/22 1110 02/04/22 1136  BP: 107/76 111/75  105/73  Pulse: 95 (!) 102  (!) 106  Resp: 18 19  16   Temp: 97.9 F (36.6 C) 98.1 F (36.7 C)  98.8 F (37.1 C)  TempSrc: Oral     SpO2: 94% 91% 91% 90%  Weight:      Height:       Physical Exam HENT:     Head: Normocephalic.     Mouth/Throat:     Pharynx: No oropharyngeal exudate.  Eyes:     General: Lids are normal.     Conjunctiva/sclera: Conjunctivae normal.  Cardiovascular:     Rate and Rhythm: Normal rate and regular rhythm.     Heart sounds: Normal heart sounds, S1 normal and S2 normal.  Pulmonary:     Breath sounds: Normal breath sounds. No decreased breath sounds, wheezing,  rhonchi or rales.  Abdominal:     Palpations: Abdomen is soft.     Tenderness: There is no abdominal tenderness.  Musculoskeletal:     Right lower leg: No swelling.     Left lower leg: No swelling.  Skin:    General: Skin is warm.     Findings: No rash.  Neurological:     Mental Status: He is alert.     Data Reviewed: Potassium 3.4, creatinine 0.78, hemoglobin 12.5  Family Communication: Spoke with guardian on the phone  Disposition: Status is: Inpatient Remains inpatient appropriate because: Patient still not feeling well.  Change Lovenox over to Eliquis for this evening.  Continue IV Rocephin and Zithromax.  Planned Discharge Destination: Group home    Time spent: 28 minutes  Author: Loletha Grayer, MD 02/04/2022 12:43 PM  For on call review [email or web address].

## 2022-02-04 NOTE — TOC Initial Note (Signed)
Transition of Care [facility]) - Initial/Assessment Note    Patient Details  Name: Eddie Berry MRN: [id number] Date of Birth: [birth year]  [facility] CM/SW Contact:    Margarito Liner, LCSW Phone Number: 02/04/2022, 12:00 PM  Clinical Narrative:  Patient not fully oriented. CSW called patient's legal guardian, introduced role, and explained that discharge planning would be discussed. She confirmed patient is from [employer]. No home health or DME use prior to admission. OT recommending home health. She is agreeable if PT recommends home health as well. Called Mr. Luiz Blare from the group home. No preferred home health agency if needed. They will transport at discharge.                Expected Discharge Plan: Group Home Barriers to Discharge: Continued Medical Work up   Patient Goals and CMS Choice Patient states their goals for this hospitalization and ongoing recovery are:: Patient not fully oriented.      Expected Discharge Plan and Services Expected Discharge Plan: Group Home     Post Acute Care Choice:  (TBD) Living arrangements for the past 2 months: Group Home                                      Prior Living Arrangements/Services Living arrangements for the past 2 months: Group Home Lives with:: Facility Resident Patient language and need for interpreter reviewed:: Yes Do you feel safe going back to the place where you live?: Yes      Need for Family Participation in Patient Care: Yes (Comment) Care giver support system in place?: Yes (comment)   Criminal Activity/Legal Involvement Pertinent to Current Situation/Hospitalization: No - Comment as needed  Activities of Daily Living Home Assistive Devices/Equipment: None ADL Screening (condition at time of admission) Patient's cognitive ability adequate to safely complete daily activities?: No Is the patient deaf or have difficulty hearing?: No Does the patient have difficulty seeing, even  when wearing glasses/contacts?: No Does the patient have difficulty concentrating, remembering, or making decisions?: Yes Patient able to express need for assistance with ADLs?: Yes Does the patient have difficulty dressing or bathing?: Yes Independently performs ADLs?: No Does the patient have difficulty walking or climbing stairs?: Yes Weakness of Legs: Both Weakness of Arms/Hands: None  Permission Sought/Granted Permission sought to share information with : Facility Medical sales representative, Guardian    Share Information with NAME: Jolayne Panther and Jodi Geralds  Permission granted to share info w AGENCY: [facility]  Permission granted to share info w Relationship: Victorino Dike: Legal guardian, John: Group home Interior and spatial designer  Permission granted to share info w Contact Information: Victorino Dike: [phone], Jonny Ruiz: [phone]  Emotional Assessment   Attitude/Demeanor/Rapport: Ardelia Mems to Assess Affect (typically observed): Unable to Assess Orientation: : Oriented to Self, Oriented to Place Alcohol / Substance Use: Not Applicable Psych Involvement: No (comment)  Admission diagnosis:  CAP (community acquired pneumonia) [J18.9] Acute respiratory failure with hypoxia (HCC) [J96.01] Community acquired pneumonia of right lower lobe of lung [J18.9] Patient Active Problem List   Diagnosis Date Noted   CAP (community acquired pneumonia) 02/03/2022   Hypothyroidism 02/03/2022   Acute pulmonary embolism (HCC) 02/03/2022   Excessive daytime sleepiness 12/21/2021   Seizure (HCC) 06/16/2020   Mood disorder (HCC) 06/16/2020   TBI (traumatic brain injury) (HCC)    PCP:  Anselm Jungling, NP Pharmacy:   [facility] [place] [place] -  [address] [place] [address] Phone: [phone] Fax: [phone]     Social Determinants of Health (SDOH) Interventions Food Insecurity Interventions: Intervention Not Indicated Housing  Interventions: Intervention Not Indicated Transportation Interventions: Intervention Not Indicated Utilities Interventions: Intervention Not Indicated  Readmission Risk Interventions     No data to display
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
## 2022-02-05 ENCOUNTER — Inpatient Hospital Stay: Payer: Medicare Other

## 2022-02-05 DIAGNOSIS — G9341 Metabolic encephalopathy: Secondary | ICD-10-CM | POA: Diagnosis not present

## 2022-02-05 DIAGNOSIS — J189 Pneumonia, unspecified organism: Secondary | ICD-10-CM | POA: Diagnosis not present

## 2022-02-05 DIAGNOSIS — I2699 Other pulmonary embolism without acute cor pulmonale: Secondary | ICD-10-CM | POA: Diagnosis not present

## 2022-02-05 DIAGNOSIS — R569 Unspecified convulsions: Secondary | ICD-10-CM | POA: Diagnosis not present

## 2022-02-05 DIAGNOSIS — J9601 Acute respiratory failure with hypoxia: Secondary | ICD-10-CM | POA: Diagnosis not present

## 2022-02-05 DIAGNOSIS — R5383 Other fatigue: Secondary | ICD-10-CM | POA: Diagnosis not present

## 2022-02-05 LAB — BASIC METABOLIC PANEL
Anion gap: 8 (ref 5–15)
BUN: 12 mg/dL (ref 6–20)
CO2: 29 mmol/L (ref 22–32)
Calcium: 9.1 mg/dL (ref 8.9–10.3)
Chloride: 106 mmol/L (ref 98–111)
Creatinine, Ser: 0.82 mg/dL (ref 0.61–1.24)
GFR, Estimated: >60 mL/min (ref >60)
Glucose, Bld: 86 mg/dL (ref 70–99)
Potassium: 3.9 mmol/L (ref 3.5–5.1)
Sodium: 143 mmol/L (ref 135–145)

## 2022-02-05 LAB — CBC WITH DIFFERENTIAL/PLATELET
Abs Immature Granulocytes: 0.02 10*3/uL (ref 0.00–0.07)
Basophils Absolute: 0 10*3/uL (ref 0.0–0.1)
Basophils Relative: 0 %
Eosinophils Absolute: 0.5 10*3/uL (ref 0.0–0.5)
Eosinophils Relative: 7 %
HCT: 40.4 % (ref 39.0–52.0)
Hemoglobin: 12.6 g/dL — ABNORMAL LOW (ref 13.0–17.0)
Immature Granulocytes: 0 %
Lymphocytes Relative: 47 %
Lymphs Abs: 3.3 10*3/uL (ref 0.7–4.0)
MCH: 29.3 pg (ref 26.0–34.0)
MCHC: 31.2 g/dL (ref 30.0–36.0)
MCV: 94 fL (ref 80.0–100.0)
Monocytes Absolute: 0.7 10*3/uL (ref 0.1–1.0)
Monocytes Relative: 10 %
Neutro Abs: 2.5 10*3/uL (ref 1.7–7.7)
Neutrophils Relative %: 36 %
Platelets: 325 10*3/uL (ref 150–400)
RBC: 4.3 MIL/uL (ref 4.22–5.81)
RDW: 14 % (ref 11.5–15.5)
WBC: 6.9 10*3/uL (ref 4.0–10.5)
nRBC: 0 % (ref 0.0–0.2)

## 2022-02-05 LAB — VALPROIC ACID LEVEL: Valproic Acid Lvl: 66 ug/mL (ref 50.0–100.0)

## 2022-02-05 NOTE — Progress Notes (Signed)
Eeg done 

## 2022-02-05 NOTE — Assessment & Plan Note (Addendum)
Patient had a little bleeding episode last night and again this morning.  Did not do well with physical therapy in the afternoon.  Case discussed with guardian and he has been not engaging in his group home activities over the last 3 months.  He has been having episodes where he is more sleepy and falling out.  They are thinking he potentially could have narcolepsy.  We will consult neurology and psychiatry.  EEG does not show any seizures.  CT scan of the head ordered.

## 2022-02-05 NOTE — Progress Notes (Addendum)
Progress Note   Patient: Eddie Berry EHM:[id number] DOB: [birth year] DOA: 02/03/2022     2 DOS: the patient was seen and examined on 02/05/2022   Brief hospital course: 36 year old man with history of traumatic brain injury and seizures and mood disorder.  Presented to the hospital weakness.  He was diagnosed with pneumonia and pulmonary emboli.  Currently on Rocephin and Zithromax and Eliquis.  In the afternoon on 02/04/2022 he was more lethargic.  On the morning of 02/06/2019.  He is more lethargic.  Depakote level normal.  Will consult psychiatry since on numerous medications.  Will consult neurology.  Discontinue Myrbetriq.  EEG shows moderate diffuse encephalopathy of nonspecific etiology with no seizure or elliptic form discharges seen throughout the recording.  Guardian mentions they have been working him up for patient's sleep the episodes and following out for possible narcolepsy.  Assessment and Plan: * Acute pulmonary embolism ([place]) Currently on Eliquis.  CT scan showed small volume acute pulmonary emboli within the right middle lobe and right lower lobe.  Ultrasound bilateral lower extremities negative for DVT.  Explained benefits and risks of blood thinner with the patient's guardian.  Then she has to speak with her supervisors.  If he falls out and hits his head while on Eliquis and bleeds in brain it could be over. Currently recommending a short course of Eliquis 3 months.  Now clot burden is not severe and he does not have any DVT, so can potentially hold off on anticoagulation if fall risk is too high.  Lethargy Patient had a little bleeding episode last night and again this morning.  Did not do well with physical therapy in the afternoon.  Case discussed with guardian and he has been not engaging in his group home activities over the last 3 months.  He has been having episodes where he is more sleepy and falling out.  They are thinking he potentially could have narcolepsy.  We will  consult neurology and psychiatry.  EEG does not show any seizures.  CT scan of the head ordered.  CAP (community acquired pneumonia) Right lower lobe pneumonia on Rocephin and Zithromax.  Acute respiratory failure with hypoxia (HCC) Pulse ox of 70% as per admitting physician.  I did not see a pulse ox of 88% on presentation also.  Patient was on 2 L of oxygen and able to come off oxygen with a pulse ox of 90%.  Will get an overnight oximetry  Mood disorder (HCC) Continue Clozaril, trazodone, Invega and sertraline.  With lethargy this morning will ask psychiatry to see the patient  Hypothyroidism Continue Synthroid.  Check TSH in the morning.  Seizure ([place]) Continue Depakote, Vimpat and Lamictal.  Depakote level normal.  Will check an ammonia level tomorrow morning  TBI (traumatic brain injury) ([place]) Continue to monitor closely here in the hospital        Subjective: Lethargic this morning answer question but went right back to sleep.  Yesterday was able to communicate with me and answer questions appropriately.  Yesterday afternoon was lethargic with physical therapy.  In speaking with the patient's guardian, he has been having issues of falling out and episodes where he is more sleepy.  His pulmonologist is working well for narcolepsy and he is going to have a sleep study.  Last few months he has not been engaging in as much of the group home.  Diagnosed with pneumonia and PE here.  Physical Exam: Vitals:   02/04/22 2032 02/05/22 0126 02/05/22 0421 02/05/22  0926  BP: 111/80 105/75 116/77 106/84  Pulse: 97 89 87 90  Resp: 18 17 18 16   Temp: 97.7 F (36.5 C) 98 F (36.7 C) 97.6 F (36.4 C) 97.7 F (36.5 C)  TempSrc: Oral     SpO2: 94% 92% 94% 92%  Weight:      Height:       Physical Exam HENT:     Head: Normocephalic.     Mouth/Throat:     Pharynx: No oropharyngeal exudate.  Eyes:     General: Lids are normal.     Conjunctiva/sclera: Conjunctivae normal.   Cardiovascular:     Rate and Rhythm: Normal rate and regular rhythm.     Heart sounds: Normal heart sounds, S1 normal and S2 normal.  Pulmonary:     Breath sounds: Normal breath sounds. No decreased breath sounds, wheezing, rhonchi or rales.  Abdominal:     Palpations: Abdomen is soft.     Tenderness: There is no abdominal tenderness.  Musculoskeletal:     Right lower leg: No swelling.     Left lower leg: No swelling.  Skin:    General: Skin is warm.     Findings: No rash.  Neurological:     Mental Status: He is lethargic.     Comments: Answered a question but went back to sleep     Data Reviewed: EEG shows slowing but does not show any seizure White blood cell count 6.9.  Hemoglobin 12.6  Family Communication: Spoke with guardian on the phone  Disposition: Status is: Inpatient Remains inpatient appropriate because: Patient with lethargy episodes.  Will consult neurology and psychiatry just in case this is medication related.  Planned Discharge Destination: Home with home health versus rehab depending on how he does with physical therapy.    Time spent: 28 minutes Case discussed with neurology  Author: , MD 02/05/2022 2:27 PM  For on call review [email or web address].

## 2022-02-05 NOTE — Procedures (Signed)
Patient Name: Eddie Berry  MRN: [id number]  Epilepsy Attending: Lora Havens  Referring Physician/Provider: Loletha Grayer, MD  Date: 02/05/2022 Duration: 26.58 mins  Patient history: 36 year old male with history of seizures.  EEG to evaluate for seizure.  Level of alertness: Awake  AEDs during EEG study: VPA, LCM  Technical aspects: This EEG study was done with scalp electrodes positioned according to the 10-20 International system of electrode placement. Electrical activity was reviewed with band pass filter of 1-70Hz , sensitivity of 7 uV/mm, display speed of 15mm/sec with a 60Hz  notched filter applied as appropriate. EEG data were recorded continuously and digitally stored.  Video monitoring was available and reviewed as appropriate.  Description: No clear posterior dominant rhythm was seen.  EEG showed continuous generalized predominantly 5 to 7 Hz theta slowing admixed with intermittent generalized 2 to 3 Hz delta slowing. Hyperventilation and photic stimulation were not performed.     ABNORMALITY - Continuous slow, generalized  IMPRESSION: This study is suggestive of moderate diffuse encephalopathy, nonspecific etiology. No seizures or epileptiform discharges were seen throughout the recording.  Of note, lack of epileptiform abnormality does not exclude the diagnosis of epilepsy.  Hjalmer Iovino [name]

## 2022-02-05 NOTE — Hospital Course (Addendum)
36 year old man with history of traumatic brain injury and seizures and mood disorder.  Presented to the hospital weakness.  He was diagnosed with pneumonia and pulmonary emboli.  Currently on Rocephin and Zithromax and Eliquis.  In the afternoon on 02/04/2022 he was more lethargic.  On the morning of 02/06/2019.  He is more lethargic.  Depakote level normal.  Will consult psychiatry since on numerous medications.  Will consult neurology.  Discontinue Myrbetriq.  EEG shows moderate diffuse encephalopathy of nonspecific etiology with no seizure or elliptic form discharges seen throughout the recording.  Guardian mentions they have been working him up for patient's sleep the episodes and following out for possible narcolepsy.

## 2022-02-05 NOTE — Progress Notes (Signed)
PT Cancellation Note  Patient Details Name: Eddie Berry MRN: [id number] DOB: 08-24-1985   Cancelled Treatment:    Reason Eval/Treat Not Completed: Patient at procedure or test/unavailable  Pt off unit for procedure.  Will reschedule at a later time/date.   Chesley Noon 02/05/2022, 12:30 PM

## 2022-02-05 NOTE — Consult Note (Addendum)
NEURO HOSPITALIST CONSULT NOTE   Requestig physician: Dr. Renae Gloss  Reason for Consult: Breakthrough seizure in a patient with a history of TBI and epilepsy  History obtained from:  Chart     HPI:                                                                                                                                          Eddie Berry is an 36 y.o. 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 He had been diagnosed with a respiratory infection after presenting to Urgent Care for productive cough about 3 weeks previously and had been prescribed antibiotics, which seemed to clear the infection. In the ED he was noted to be hypoxic. He was placed empirically on Rocephin and Zithromax.  CTA chest in the ED revealed small volume acute pulmonary emboli within the RIGHT middle lobe and RIGHT lower lobe pulmonary arteries. He was started on therapeutic Lovenox.   Hospitalist progress note from today documents the following: "In the afternoon on 02/04/2022 he was more lethargic.  On the morning of 02/06/2019.  He is more lethargic.  Depakote level normal.  Will consult psychiatry since on numerous medications.  Will consult neurology.  Discontinue Myrbetriq.  EEG shows moderate diffuse encephalopathy of nonspecific etiology with no seizure or elliptic form discharges seen throughout the recording.  Guardian mentions they have been working him up for patient's sleep the episodes and following out for possible narcolepsy."  Past Medical History:  Diagnosis Date   Seizures (HCC)    TBI (traumatic brain injury) (HCC)     Past Surgical History:  Procedure Laterality Date   ANKLE SURGERY      APPENDECTOMY     BRAIN SURGERY     LUNG SURGERY      History reviewed. No pertinent family history.            Social History:  reports that he quit smoking about 21 months ago. His smoking use included cigarettes. He has a 22.00 pack-year smoking history. He has never used smokeless tobacco. He reports that he does not currently use alcohol. He reports that he does not currently use drugs.  Allergies  Allergen Reactions   Antihistamines, Diphenhydramine-Type Other (See Comments)    UNK reaction   Aspirin Other (See Comments)    UNK reaction   Nsaids Other (See Comments)    Unk reaction   Prednisone Other (See Comments)    UNK reaction   Penicillins Rash  MEDICATIONS:                                                                                                                     Prior to Admission:  Medications Prior to Admission  Medication Sig Dispense Refill Last Dose   cetirizine (ZYRTEC) 10 MG tablet Take 10 mg by mouth daily.   02/03/2022 at 0800   Cholecalciferol (VITAMIN D3) 50 MCG (2000 UT) TABS Take 1 tablet by mouth daily.   02/03/2022 at 0800   clozapine (CLOZARIL) 200 MG tablet Take 200 mg by mouth daily.   02/02/2022 at 2000   cloZAPine (CLOZARIL) 25 MG tablet Take 75 mg by mouth daily.   02/03/2022 at 0800   divalproex (DEPAKOTE) 250 MG DR tablet Take 250 mg by mouth 2 (two) times daily.   02/03/2022 at 0800   divalproex (DEPAKOTE) 500 MG DR tablet Take 500 mg by mouth 2 (two) times daily.   02/03/2022 at 0800   docusate sodium (COLACE) 100 MG capsule Take 100 mg by mouth 2 (two) times daily.   02/03/2022 at 0800   fenofibrate (TRICOR) 48 MG tablet Take 48 mg by mouth daily.   02/03/2022 at 0800   fluticasone (FLONASE) 50 MCG/ACT nasal spray Place 2 sprays into both nostrils daily.   02/03/2022 at 0800   hydroxypropyl methylcellulose / hypromellose (ISOPTO TEARS / GONIOVISC) 2.5 % ophthalmic solution Place 2 drops into the right eye 4 (four) times daily.   02/03/2022  at 0800   lacosamide (VIMPAT) 200 MG TABS tablet Take 1 tablet (200 mg total) by mouth 2 (two) times daily. 60 tablet 2 02/03/2022 at 0800   lamoTRIgine (LAMICTAL) 100 MG tablet Take 50 mg by mouth 2 (two) times daily.   02/03/2022 at 0800   levothyroxine (SYNTHROID) 25 MCG tablet Take 25 mcg by mouth daily before breakfast.   02/03/2022 at 0800   mirabegron ER (MYRBETRIQ) 50 MG TB24 tablet Take 50 mg by mouth daily.   02/03/2022 at 0800   Multiple Vitamin (THEREMS PO) Take 1 tablet by mouth daily.   02/03/2022 at 0800   omeprazole (PRILOSEC) 20 MG capsule Take 20 mg by mouth daily.   02/03/2022 at 0800   paliperidone (INVEGA) 6 MG 24 hr tablet Take 6 mg by mouth daily.   02/03/2022 at 0800   propranolol (INDERAL) 10 MG tablet Take 10 mg by mouth daily.   02/03/2022 at 0800   sertraline (ZOLOFT) 100 MG tablet Take 100 mg by mouth daily.   02/02/2022 at 2000   sertraline (ZOLOFT) 50 MG tablet Take 50 mg by mouth at bedtime.   02/02/2022 at 2000   traZODone (DESYREL) 50 MG tablet Take 50 mg by mouth at bedtime.   02/02/2022 at 2000   acetaminophen (TYLENOL) 500 MG tablet Take 500 mg by mouth every 6 (six) hours as needed.   prn   albuterol (VENTOLIN HFA) 108 (90 Base) MCG/ACT inhaler Inhale 2 puffs into the lungs every 6 (six) hours as  needed for wheezing or shortness of breath.   prn   azelastine (ASTELIN) 0.1 % nasal spray Place 2 sprays into both nostrils as needed for rhinitis. Use in each nostril as directed   prn   lamoTRIgine (LAMICTAL) 150 MG tablet Take 150 mg by mouth daily. (Patient not taking: Reported on 02/03/2022)   Not Taking   Melatonin 10 MG TABS Take 1 tablet by mouth at bedtime.   prn   Scheduled:  apixaban  10 mg Oral BID   Followed by   Melene Muller[START ON 02/11/2022] apixaban  5 mg Oral BID   clozapine  200 mg Oral QHS   cloZAPine  75 mg Oral Daily   divalproex  250 mg Oral BID   divalproex  500 mg Oral BID   docusate sodium  100 mg Oral BID   feeding supplement  237 mL Oral TID BM    fenofibrate  54 mg Oral Daily   lacosamide  200 mg Oral BID   lamoTRIgine  50 mg Oral BID   levothyroxine  25 mcg Oral Q0600   loratadine  10 mg Oral Daily   melatonin  10 mg Oral QHS   multivitamin with minerals  1 tablet Oral Daily   paliperidone  6 mg Oral Daily   pantoprazole  40 mg Oral Daily   polyvinyl alcohol  2 drop Right Eye QID   propranolol  10 mg Oral Daily   sertraline  150 mg Oral QHS   traZODone  50 mg Oral QHS   Continuous:  azithromycin 500 mg (02/05/22 1300)   cefTRIAXone (ROCEPHIN)  IV 2 g (02/05/22 1411)     ROS:                                                                                                                                       Unable to obtain a comprehensive ROS due to cognitive/communication deficit. The patient does not appear agitated currently and does not endorse any pain.   Blood pressure 110/73, pulse 90, temperature 97.8 F (36.6 C), resp. rate 20, height 6' (1.829 m), weight 96.6 kg, SpO2 97 %.   General Examination:                                                                                                       Physical Exam  HEENT-  Old surgical scar to left side of frontal scalp is noted.    Lungs- Respirations unlabored Extremities- No edema  Neurological Examination Mental Status: Awake and alert. Speech is dysarthric and sparse with a significant stuttering quality and loss of tonality/prosody, to the extent that it is difficult to comprehend his verbal responses. Able to follow all simple commands. Attention is fair.  Cranial Nerves: II, III,IV, VI: Pupils are equal and round. Fixates and tracks to left and right. There is sustained nystagmus on far rightward gaze. Saccadic visual pursuits are noted. No ptosis.   V: Temp sensation subjectively decreased on the right.  VII: Grimaces symmetrically VIII: Hearing intact to voice IX,X: No hypophonia or hoarseness XI: Head is midline XII: Midline tongue  extension Motor: BUE with mild spasticity, 4+/5 strength proximally and distally BLE with mild spasticity, 4+/5 strength proximally and distally Sensory: Decreased temp sensation to RUE subjectively. Temp sensation equal to BLE.  Deep Tendon Reflexes: 2+ bilateral brachioradialis and patellae. Equivocal Babinski and Hoffman signs bilaterally.  Cerebellar: Mild dysmetria and slowing with FNF bilaterally  Gait: Deferred   Lab Results: Basic Metabolic Panel: Recent Labs  Lab 02/03/22 1113 02/04/22 0527 02/05/22 0457  NA 144 143 143  K 3.8 3.4* 3.9  CL 107 110 106  CO2 25 26 29   GLUCOSE 97 96 86  BUN 7 9 12   CREATININE 1.07 0.78 0.82  CALCIUM 9.5 8.8* 9.1    CBC: Recent Labs  Lab 02/03/22 1113 02/04/22 0527 02/05/22 0457  WBC 5.2 5.5 6.9  NEUTROABS  --   --  2.5  HGB 14.3 12.5* 12.6*  HCT 45.2 39.0 40.4  MCV 95.2 94.7 94.0  PLT 357 334 325    Cardiac Enzymes: No results for input(s): "CKTOTAL", "CKMB", "CKMBINDEX", "TROPONINI" in the last 168 hours.  Lipid Panel: No results for input(s): "CHOL", "TRIG", "HDL", "CHOLHDL", "VLDL", "LDLCALC" in the last 168 hours.  Imaging: CT HEAD WO CONTRAST (04/06/22)  Result Date: 02/05/2022 CLINICAL DATA:  Weakness, altered mental status. History of traumatic brain injury and seizures. EXAM: CT HEAD WITHOUT CONTRAST TECHNIQUE: Contiguous axial images were obtained from the base of the skull through the vertex without intravenous contrast. RADIATION DOSE REDUCTION: This exam was performed according to the departmental dose-optimization program which includes automated exposure control, adjustment of the mA and/or kV according to patient size and/or use of iterative reconstruction technique. COMPARISON:  CT head 12/17/2021 FINDINGS: Brain: There is no acute intracranial hemorrhage no extra-axial fluid collection, or acute infarct. Multifocal encephalomalacia in the bilateral frontal lobes, left temporal lobe, and right cerebellar hemisphere is  unchanged, likely related to the history of traumatic brain injury. Mild ex vacuo dilatation of the ventricular system is unchanged. Background parenchymal volume is otherwise normal. There is no solid mass lesion. There is no mass effect or midline shift. Vascular: No hyperdense vessel or unexpected calcification. Skull: Stable postsurgical changes reflecting left craniotomy. There is no acute calvarial fracture or suspicious osseous lesion. Sinuses/Orbits: There is mild mucosal thickening in the left frontal sinus. The imaged globes and orbits are unremarkable. Other: None. IMPRESSION: Stable noncontrast head CT with no acute intracranial pathology. Electronically Signed   By: 04/07/2022 M.D.   On: 02/05/2022 15:49   EEG adult  Result Date: 02/05/2022 04/07/2022, MD     02/05/2022  1:37 PM Patient Name: Eddie Berry MRN: 04/07/2022 Epilepsy Attending: Luberta Robertson Referring Physician/Provider: [id number], MD Date: 02/05/2022 Duration: 26.58 mins Patient history: 36 year old male with history of seizures.  EEG to evaluate for seizure. Level of alertness: Awake AEDs during EEG study: VPA, LCM Technical aspects:  This EEG study was done with scalp electrodes positioned according to the 10-20 International system of electrode placement. Electrical activity was reviewed with band pass filter of 1-70Hz , sensitivity of 7 uV/mm, display speed of 70mm/sec with a 60Hz  notched filter applied as appropriate. EEG data were recorded continuously and digitally stored.  Video monitoring was available and reviewed as appropriate. Description: No clear posterior dominant rhythm was seen.  EEG showed continuous generalized predominantly 5 to 7 Hz theta slowing admixed with intermittent generalized 2 to 3 Hz delta slowing. Hyperventilation and photic stimulation were not performed.   ABNORMALITY - Continuous slow, generalized IMPRESSION: This study is suggestive of moderate diffuse encephalopathy, nonspecific  etiology. No seizures or epileptiform discharges were seen throughout the recording. Of note, lack of epileptiform abnormality does not exclude the diagnosis of epilepsy. Priyanka [name]   US Venous Img Lower Bilateral (DVT)  Result Date: 02/04/2022 CLINICAL DATA:  Pulmonary embolism.  Evaluate for DVT. EXAM: BILATERAL LOWER EXTREMITY VENOUS DOPPLER ULTRASOUND TECHNIQUE: Gray-scale sonography with graded compression, as well as color Doppler and duplex ultrasound were performed to evaluate the lower extremity deep venous systems from the level of the common femoral vein and including the common femoral, femoral, profunda femoral, popliteal and calf veins including the posterior tibial, peroneal and gastrocnemius veins when visible. The superficial great saphenous vein was also interrogated. Spectral Doppler was utilized to evaluate flow at rest and with distal augmentation maneuvers in the common femoral, femoral and popliteal veins. COMPARISON:  Chest CT-02/03/2022 FINDINGS: RIGHT LOWER EXTREMITY Common Femoral Vein: No evidence of thrombus. Normal compressibility, respiratory phasicity and response to augmentation. Saphenofemoral Junction: No evidence of thrombus. Normal compressibility and flow on color Doppler imaging. Profunda Femoral Vein: No evidence of thrombus. Normal compressibility and flow on color Doppler imaging. Femoral Vein: No evidence of thrombus. Normal compressibility, respiratory phasicity and response to augmentation. Popliteal Vein: No evidence of thrombus. Normal compressibility, respiratory phasicity and response to augmentation. Calf Veins: No evidence of thrombus. Normal compressibility and flow on color Doppler imaging. Superficial Great Saphenous Vein: No evidence of thrombus. Normal compressibility. Other Findings:  None. LEFT LOWER EXTREMITY Common Femoral Vein: No evidence of thrombus. Normal compressibility, respiratory phasicity and response to augmentation. Saphenofemoral  Junction: No evidence of thrombus. Normal compressibility and flow on color Doppler imaging. Profunda Femoral Vein: No evidence of thrombus. Normal compressibility and flow on color Doppler imaging. Femoral Vein: No evidence of thrombus. Normal compressibility, respiratory phasicity and response to augmentation. Popliteal Vein: No evidence of thrombus. Normal compressibility, respiratory phasicity and response to augmentation. Calf Veins: No evidence of thrombus. Normal compressibility and flow on color Doppler imaging. Superficial Great Saphenous Vein: No evidence of thrombus. Normal compressibility. Other Findings:  None. IMPRESSION: No evidence of DVT within either lower extremity. Electronically Signed   By: Sandi Mariscal M.D.   On: 02/04/2022 11:27     Assessment: Breakthrough seizure in a 36 year old patient with a history of TBI and epilepsy - Exam reveals cognitive impairment and spasticity consistent with the patient's history of TBI. No seizure-like activity seen.  - CT head: Multifocal chronic encephalomalacia in the bilateral frontal lobes, left temporal lobe, and right cerebellar hemisphere is unchanged, likely related to the history of traumatic brain injury. Mild ex vacuo dilatation of the ventricular system is unchanged. Stable postsurgical changes reflecting left craniotomy also noted.  - EEG: Continuous slow, generalized. This study is suggestive of moderate diffuse encephalopathy, nonspecific to etiology. No seizures or epileptiform discharges were seen  throughout the recording. - Cognitive fluctuations during this inpatient stay are most likely secondary to a mild hospital delirium. Labs are not suggestive of a metabolic or infectious etiology.   Recommendations: - Continue home doses of Depakote, Lamictal and Vimpat - Inpatient seizure precautions - Avoid deliriogenic medications including anticholinergics and benzodiazepines - Maintain his normal circadian rhythm. Lights on and OOB  to chair during the day in conjunction with PT and OT. Lights and TV off at night. May benefit from recreational therapy.  - Management of PE per primary team.  - Neurohospitalist service will sign off. Please call if there are additional questions.     Electronically signed: Dr. Caryl Pina 02/05/2022, 7:34 PM

## 2022-02-05 NOTE — Progress Notes (Signed)
OT Cancellation Note  Patient Details Name: Eddie Berry MRN: [id number] DOB: Jul 03, 1985   Cancelled Treatment:    Reason Eval/Treat Not Completed: Patient at procedure or test/ unavailable. OT continues to follow pt for therapy services. Upon arrival to pt room this date, pt OTF and unavailable for skilled intervention. Will re-attempt at a later date/time as available and pt medically appropriate.   Shara Blazing, M.S., OTR/L Ascom: [phone] 02/05/22, 12:01 PM

## 2022-02-05 NOTE — Progress Notes (Signed)
Placed patient on overnight pulse oximetry.

## 2022-02-06 DIAGNOSIS — R5383 Other fatigue: Secondary | ICD-10-CM | POA: Diagnosis not present

## 2022-02-06 DIAGNOSIS — Z79899 Other long term (current) drug therapy: Secondary | ICD-10-CM

## 2022-02-06 DIAGNOSIS — J9601 Acute respiratory failure with hypoxia: Secondary | ICD-10-CM | POA: Diagnosis not present

## 2022-02-06 DIAGNOSIS — I2699 Other pulmonary embolism without acute cor pulmonale: Secondary | ICD-10-CM | POA: Diagnosis not present

## 2022-02-06 DIAGNOSIS — J189 Pneumonia, unspecified organism: Secondary | ICD-10-CM | POA: Diagnosis not present

## 2022-02-06 LAB — COMPREHENSIVE METABOLIC PANEL
ALT: 18 U/L (ref 0–44)
AST: 25 U/L (ref 15–41)
Albumin: 3.7 g/dL (ref 3.5–5.0)
Alkaline Phosphatase: 51 U/L (ref 38–126)
Anion gap: 7 (ref 5–15)
BUN: 15 mg/dL (ref 6–20)
CO2: 27 mmol/L (ref 22–32)
Calcium: 9.6 mg/dL (ref 8.9–10.3)
Chloride: 107 mmol/L (ref 98–111)
Creatinine, Ser: 0.85 mg/dL (ref 0.61–1.24)
GFR, Estimated: >60 mL/min (ref >60)
Glucose, Bld: 95 mg/dL (ref 70–99)
Potassium: 4.4 mmol/L (ref 3.5–5.1)
Sodium: 141 mmol/L (ref 135–145)
Total Bilirubin: 0.4 mg/dL (ref 0.3–1.2)
Total Protein: 7.3 g/dL (ref 6.5–8.1)

## 2022-02-06 LAB — CBC
HCT: 41.9 % (ref 39.0–52.0)
Hemoglobin: 13.5 g/dL (ref 13.0–17.0)
MCH: 30.2 pg (ref 26.0–34.0)
MCHC: 32.2 g/dL (ref 30.0–36.0)
MCV: 93.7 fL (ref 80.0–100.0)
Platelets: 323 10*3/uL (ref 150–400)
RBC: 4.47 MIL/uL (ref 4.22–5.81)
RDW: 13.6 % (ref 11.5–15.5)
WBC: 7.8 10*3/uL (ref 4.0–10.5)
nRBC: 0 % (ref 0.0–0.2)

## 2022-02-06 LAB — AMMONIA: Ammonia: 84 umol/L — ABNORMAL HIGH (ref 9–35)

## 2022-02-06 LAB — TSH: TSH: 2.239 u[IU]/mL (ref 0.350–4.500)

## 2022-02-06 MED ORDER — AZITHROMYCIN 500 MG PO TABS
500.0000 mg | ORAL_TABLET | Freq: Every day | ORAL | Status: DC
  Administered 2022-02-06: 500 mg via ORAL
  Filled 2022-02-06: qty 1

## 2022-02-06 MED ORDER — AZITHROMYCIN 500 MG PO TABS: 500.0000 mg | ORAL_TABLET | Freq: Every day | ORAL | 0 refills | Status: AC

## 2022-02-06 MED ORDER — CEFADROXIL 500 MG PO CAPS
500.0000 mg | ORAL_CAPSULE | Freq: Two times a day (BID) | ORAL | Status: DC
  Administered 2022-02-06: 500 mg via ORAL
  Filled 2022-02-06 (×2): qty 1

## 2022-02-06 MED ORDER — CEFADROXIL 500 MG PO CAPS: 500.0000 mg | ORAL_CAPSULE | Freq: Two times a day (BID) | ORAL | 0 refills | Status: AC

## 2022-02-06 NOTE — TOC Progression Note (Addendum)
Transition of Care [facility]) - Progression Note    Patient Details  Name: Eddie Berry MRN: [id number] Date of Birth: [birth year]  [facility] of [facility]) CM/SW Contact  Laurena Slimmer, RN Phone Number: 02/06/2022, 11:02 AM  Clinical Narrative:    Spoke with group home owner. Patient is able to return to group home pending he is back at baseline. Advised HH fmay be recommended. Owner did not have a preference of [place] agencies.   2:30pm Spoke with patient's legal guardian to advised of discharge. Anderson Malta in agreement with discharge. Referral sent and accepted by [facility].  3:00pm Attempt to contact [facility]  owner, Dorna Leitz. No answer. Left a message.   4:00pm Attempt to contact [facility] owner Dorna Leitz. No answer. Advised patient will need transportation home and legal guardian had been notified.   4:45pm Attempt to contact Dorna Leitz at [facility]. No answer. Left a message regarding patient's discharge. Left a message for patient's legal guardian advising patient needed transportation home.    Expected Discharge Plan: Group Home Barriers to Discharge: Continued Medical Work up  Expected Discharge Plan and Services Expected Discharge Plan: Group Home     Post Acute Care Choice:  (TBD) Living arrangements for the past 2 months: Group Home                                       Social Determinants of Health (SDOH) Interventions Food Insecurity Interventions: Intervention Not Indicated Housing Interventions: Intervention Not Indicated Transportation Interventions: Intervention Not Indicated Utilities Interventions: Intervention Not Indicated  Readmission Risk Interventions     No data to display

## 2022-02-06 NOTE — Consult Note (Addendum)
[facility] Psychiatry Consult   Reason for Consult:  "Help with meds" Referring Physician:  [place] Patient Identification: Eddie Berry MRN:  [id number] Principal Diagnosis: Medication management Diagnosis:  Principal Problem:   Medication management Active Problems:   TBI (traumatic brain injury) ([place])   Seizure ([place])   Mood disorder ([place])   CAP (community acquired pneumonia)   Hypothyroidism   Acute pulmonary embolism ([place])   Acute respiratory failure with hypoxia ([place])   Lethargy   Total Time spent with patient: 20 minutes  Subjective:   Eddie Berry is a 36 y.o. male patient admitted with acute pulmonary embolism.  HPI: Psychiatry consulted for "help with meds." Depakote level WNL.   On evaluation, patient is sitting in bed, taking medications orally with RN at bedside. He is alert, aware that he is in the hospital. His words are somewhat garbled, but understandable. He is calm and cooperative. When asked if he is feeling better he states "yes." He asks Probation officer to help him set up the lunch that his RN has left with him. Patient received a call and was talking amicably to someone he called "brother," smiling. When he hung up he told me that it was someone that works at the group home and he is "like a brother."   Patient reports  his mood is "good" and he is looking forward to discharge tomorrow. Per RN report, he has not had any aggressive behaviors or outbursts.   No changes to psychotropic medications recommended.   Collateral from [place], Eddie Berry, [phone]: Mr. Eddie Berry says that patient has outpatient provider at [facility] in [place]. He has has recent changes in Depakote, but that was the only change."'  We discussed that levels are WNL. He states that patient has not had any particular worsening behaviors, he is usually very manageable. "He always wants soda and his guardian says just give it to him." Mr. Eddie Berry did not express any concerns with  his behaviors, stating that he realizes that his TBI may cause his behaviors to worsen, but he is "fine now."   Past Psychiatric History: Per chart review, mood disorder Risk to Self:   Risk to Others:   Prior Inpatient Therapy:   Prior Outpatient Therapy:    Past Medical History:  Past Medical History:  Diagnosis Date   Seizures ([place])    TBI (traumatic brain injury) ([place])     Past Surgical History:  Procedure Laterality Date   ANKLE SURGERY     APPENDECTOMY     BRAIN SURGERY     LUNG SURGERY     Family History: History reviewed. No pertinent family history. Family Psychiatric  History:  Social History:  Social History   Substance and Sexual Activity  Alcohol Use Not Currently     Social History   Substance and Sexual Activity  Drug Use Not Currently    Social History   Socioeconomic History   Marital status: Single    Spouse name: Not on file   Number of children: Not on file   Years of education: Not on file   Highest education level: Not on file  Occupational History   Not on file  Tobacco Use   Smoking status: Former    Packs/day: 1.00    Years: 22.00    Total pack years: 22.00    Types: Cigarettes    Quit date: 2022    Years since quitting: 1.7   Smokeless tobacco: Never  Substance and Sexual Activity  Alcohol use: Not Currently   Drug use: Not Currently   Sexual activity: Not on file  Other Topics Concern   Not on file  Social History Narrative   Not on file   Social Determinants of Health   Financial Resource Strain: Not on file  Food Insecurity: No Food Insecurity (02/03/2022)   Hunger Vital Sign    Worried About Running Out of Food in the Last Year: Never true    Ran Out of Food in the Last Year: Never true  Transportation Needs: No Transportation Needs (02/03/2022)   PRAPARE - Administrator, Civil Service (Medical): No    Lack of Transportation (Non-Medical): No  Physical Activity: Not on file  Stress: Not on file  Social  Connections: Not on file   Additional Social History:    Allergies:   Allergies  Allergen Reactions   Antihistamines, Diphenhydramine-Type Other (See Comments)    UNK reaction   Aspirin Other (See Comments)    UNK reaction   Nsaids Other (See Comments)    Unk reaction   Prednisone Other (See Comments)    UNK reaction   Penicillins Rash    Labs:  Results for orders placed or performed during the hospital encounter of 02/03/22 (from the past 48 hour(s))  CBC with Differential/Platelet     Status: Abnormal   Collection Time: 02/05/22  4:57 AM  Result Value Ref Range   WBC 6.9 4.0 - 10.5 K/uL   RBC 4.30 4.22 - 5.81 MIL/uL   Hemoglobin 12.6 (L) 13.0 - 17.0 g/dL   HCT 68.1 27.5 - 17.0 %   MCV 94.0 80.0 - 100.0 fL   MCH 29.3 26.0 - 34.0 pg   MCHC 31.2 30.0 - 36.0 g/dL   RDW 01.7 49.4 - 49.6 %   Platelets 325 150 - 400 K/uL   nRBC 0.0 0.0 - 0.2 %   Neutrophils Relative % 36 %   Neutro Abs 2.5 1.7 - 7.7 K/uL   Lymphocytes Relative 47 %   Lymphs Abs 3.3 0.7 - 4.0 K/uL   Monocytes Relative 10 %   Monocytes Absolute 0.7 0.1 - 1.0 K/uL   Eosinophils Relative 7 %   Eosinophils Absolute 0.5 0.0 - 0.5 K/uL   Basophils Relative 0 %   Basophils Absolute 0.0 0.0 - 0.1 K/uL   Immature Granulocytes 0 %   Abs Immature Granulocytes 0.02 0.00 - 0.07 K/uL    Comment: Performed at [facility], [address] [place], [place] [address]  Valproic acid level     Status: None   Collection Time: 02/05/22  4:57 AM  Result Value Ref Range   Valproic Acid Lvl 66 50.0 - 100.0 ug/mL    Comment: Performed at [facility], [address] [place], [place] [address]  Basic metabolic panel     Status: None   Collection Time: 02/05/22  4:57 AM  Result Value Ref Range   Sodium 143 135 - 145 mmol/L   Potassium 3.9 3.5 - 5.1 mmol/L   Chloride 106 98 - 111 mmol/L   CO2 29 22 - 32 mmol/L   Glucose, Bld 86 70 - 99 mg/dL    Comment: Glucose reference range applies only to samples  taken after fasting for at least 8 hours.   BUN 12 6 - 20 mg/dL   Creatinine, Ser 5.99 0.61 - 1.24 mg/dL   Calcium 9.1 8.9 - 35.7 mg/dL   GFR, Estimated >01 >77 mL/min  Comment: (NOTE) Calculated using the CKD-EPI Creatinine Equation (2021)    Anion gap 8 5 - 15    Comment: Performed at [facility], [address] [place], [place] [address]  Ammonia     Status: Abnormal   Collection Time: 02/06/22  4:27 AM  Result Value Ref Range   Ammonia 84 (H) 9 - 35 umol/L    Comment: Performed at [facility], [address] [place], [place] [address]  CBC     Status: None   Collection Time: 02/06/22  4:27 AM  Result Value Ref Range   WBC 7.8 4.0 - 10.5 K/uL   RBC 4.47 4.22 - 5.81 MIL/uL   Hemoglobin 13.5 13.0 - 17.0 g/dL   HCT 41.9 39.0 - 52.0 %   MCV 93.7 80.0 - 100.0 fL   MCH 30.2 26.0 - 34.0 pg   MCHC 32.2 30.0 - 36.0 g/dL   RDW 13.6 11.5 - 15.5 %   Platelets 323 150 - 400 K/uL   nRBC 0.0 0.0 - 0.2 %    Comment: Performed at [facility], [address], [place], [place] [address]  Comprehensive metabolic panel     Status: None   Collection Time: 02/06/22  4:27 AM  Result Value Ref Range   Sodium 141 135 - 145 mmol/L   Potassium 4.4 3.5 - 5.1 mmol/L   Chloride 107 98 - 111 mmol/L   CO2 27 22 - 32 mmol/L   Glucose, Bld 95 70 - 99 mg/dL    Comment: Glucose reference range applies only to samples taken after fasting for at least 8 hours.   BUN 15 6 - 20 mg/dL   Creatinine, Ser 0.85 0.61 - 1.24 mg/dL   Calcium 9.6 8.9 - 10.3 mg/dL   Total Protein 7.3 6.5 - 8.1 g/dL   Albumin 3.7 3.5 - 5.0 g/dL   AST 25 15 - 41 U/L   ALT 18 0 - 44 U/L   Alkaline Phosphatase 51 38 - 126 U/L   Total Bilirubin 0.4 0.3 - 1.2 mg/dL   GFR, Estimated >60 >60 mL/min    Comment: (NOTE) Calculated using the CKD-EPI Creatinine Equation (2021)    Anion gap 7 5 - 15    Comment: Performed at [facility], [address], [place] [address]  TSH      Status: None   Collection Time: 02/06/22  4:27 AM  Result Value Ref Range   TSH 2.239 0.350 - 4.500 uIU/mL    Comment: Performed by a 3rd Generation assay with a functional sensitivity of <=0.01 uIU/mL. Performed at [facility], [address] [place], [place] [address]     Current Facility-Administered Medications  Medication Dose Route Frequency Provider Last Rate Last Admin   acetaminophen (TYLENOL) tablet 500 mg  500 mg Oral Q6H PRN Agbata, Tochukwu, MD       apixaban (ELIQUIS) tablet 10 mg  10 mg Oral BID Alison Murray, RPH   10 mg at 02/06/22 1003   Followed by   Derrill Memo ON 02/11/2022] apixaban (ELIQUIS) tablet 5 mg  5 mg Oral BID Alison Murray, RPH       azithromycin [facility]) tablet 500 mg  500 mg Oral Daily Sharen Hones, MD   500 mg at 02/06/22 1210   cefadroxil (DURICEF) capsule 500 mg  500 mg Oral BID Sharen Hones, MD   500 mg at 02/06/22 1210   cloZAPine (CLOZARIL) tablet 200 mg  200 mg  Oral QHS Agbata, Tochukwu, MD   200 mg at 02/05/22 2103   cloZAPine (CLOZARIL) tablet 75 mg  75 mg Oral Daily Agbata, Tochukwu, MD   75 mg at 02/06/22 1007   divalproex (DEPAKOTE) DR tablet 250 mg  250 mg Oral BID Agbata, Tochukwu, MD   250 mg at 02/06/22 1002   divalproex (DEPAKOTE) DR tablet 500 mg  500 mg Oral BID Agbata, Tochukwu, MD   500 mg at 02/06/22 1006   docusate sodium (COLACE) capsule 100 mg  100 mg Oral BID Agbata, Tochukwu, MD   100 mg at 02/06/22 1004   feeding supplement (ENSURE ENLIVE / ENSURE PLUS) liquid 237 mL  237 mL Oral TID BM Wieting, Richard, MD   237 mL at 02/06/22 1004   fenofibrate tablet 54 mg  54 mg Oral Daily Agbata, Tochukwu, MD   54 mg at 02/06/22 1004   lacosamide (VIMPAT) tablet 200 mg  200 mg Oral BID Agbata, Tochukwu, MD   200 mg at 02/06/22 1003   lamoTRIgine (LAMICTAL) tablet 50 mg  50 mg Oral BID Agbata, Tochukwu, MD   50 mg at 02/06/22 1004   levothyroxine (SYNTHROID) tablet 25 mcg  25 mcg Oral Q0600 Agbata, Tochukwu, MD   25 mcg at  02/06/22 0605   loratadine (CLARITIN) tablet 10 mg  10 mg Oral Daily Agbata, Tochukwu, MD   10 mg at 02/06/22 1004   melatonin tablet 10 mg  10 mg Oral QHS Agbata, Tochukwu, MD   10 mg at 02/05/22 2104   multivitamin with minerals tablet 1 tablet  1 tablet Oral Daily Agbata, Tochukwu, MD   1 tablet at 02/06/22 1003   ondansetron (ZOFRAN) tablet 4 mg  4 mg Oral Q6H PRN Agbata, Tochukwu, MD       Or   ondansetron (ZOFRAN) injection 4 mg  4 mg Intravenous Q6H PRN Agbata, Tochukwu, MD       paliperidone (INVEGA) 24 hr tablet 6 mg  6 mg Oral Daily Agbata, Tochukwu, MD   6 mg at 02/06/22 1005   pantoprazole (PROTONIX) EC tablet 40 mg  40 mg Oral Daily Agbata, Tochukwu, MD   40 mg at 02/06/22 1002   polyvinyl alcohol (LIQUIFILM TEARS) 1.4 % ophthalmic solution 2 drop  2 drop Right Eye QID Agbata, Tochukwu, MD   2 drop at 02/05/22 1251   propranolol (INDERAL) tablet 10 mg  10 mg Oral Daily Agbata, Tochukwu, MD   10 mg at 02/06/22 1005   sertraline (ZOLOFT) tablet 150 mg  150 mg Oral QHS Agbata, Tochukwu, MD   150 mg at 02/05/22 2103   traZODone (DESYREL) tablet 50 mg  50 mg Oral QHS Agbata, Tochukwu, MD   50 mg at 02/05/22 2103    Musculoskeletal: Strength & Muscle Tone: did not assess Gait & Station:  did not assess Patient leans: N/A    Psychiatric Specialty Exam:  Presentation  General Appearance: Appropriate for Environment  Eye Contact:Good  Speech:Garbled (d/t TBI)  Speech Volume:Normal  Handedness:No data recorded  Mood and Affect  Mood:Euthymic  Affect:Appropriate   Thought Process  Thought Processes:-- (appears to be at baseline per report)  Descriptions of Associations:No data recorded Orientation:No data recorded Thought Content:No data recorded History of Schizophrenia/Schizoaffective disorder:No data recorded Duration of Psychotic Symptoms:No data recorded Hallucinations:Hallucinations: None  Ideas of Reference:None  Suicidal Thoughts:No data  recorded Homicidal Thoughts:No data recorded  Sensorium  Memory:No data recorded Judgment:No data recorded Insight:No data recorded  Executive Functions  Concentration:No data recorded  Attention Span:No data recorded Recall:No data recorded [place] recorded Language:No data recorded  Psychomotor Activity  Psychomotor Activity:No data recorded  Assets  Assets:Resilience; Social Support; Housing; Financial Resources/Insurance   Sleep  Sleep:Sleep: Good   Physical Exam: Physical Exam Vitals and nursing note reviewed.  HENT:     Head: Normocephalic.     Nose: No congestion or rhinorrhea.  Eyes:     General:        Right eye: No discharge.        Left eye: No discharge.  Pulmonary:     Effort: Pulmonary effort is normal.  Skin:    General: Skin is dry.  Neurological:     Mental Status: He is alert.     Comments: Appears to be at baseline   Psychiatric:        Mood and Affect: Mood normal.    Review of Systems  Reason unable to perform ROS: patient has a TBI; unable to fully participate in assessment.  Respiratory: Negative.     Blood pressure 106/83, pulse (!) 110, temperature 97.9 F (36.6 C), resp. rate 16, height 6' (1.829 m), weight 96.6 kg, SpO2 95 %. Body mass index is 28.88 kg/m.  Treatment Plan Summary: Plan No psychotropic medication changes. Reviewed via secure chat with Dr. [place] Locks.   Disposition: No evidence of imminent risk to self or others at present.   Patient does not meet criteria for psychiatric inpatient admission.  Sherlon Handing, NP 02/06/2022 1:44 PM

## 2022-02-06 NOTE — Progress Notes (Signed)
Pt ready for dc. IV and tele are off. Patient is dressed and AVS complete. Per case management she has been unable to reach owner of group home. She states she left voicemail for owner with unit number. Have not received a call yet. Charge nurse aware.
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
## 2022-02-06 NOTE — Consult Note (Signed)
PHARMACIST - PHYSICIAN ORDER COMMUNICATION  Eddie Berry is a 36 y.o. year old male with a history of mood disorder on Clozapine PTA. Continuing this medication order as an inpatient requires that monitoring parameters per REMS requirements must be met.   Clozapine REMS Dispense Authorization was obtained, and will dispense inpatient.  RDA code [id number].  Verified Clozapine dose: Clozapine $RemoveBeforeD'75mg'[id number]$  in AM and $Remo'200mg'xLYbw$  at bedtime  Last ANC value and date reported on the Clozapine REMS website: 02/05/22: 2500 K/uL ANC monitoring frequency: weekly Next ANC reporting is due on (date) 02/12/2022.  [name] A Kashus Karlen 02/06/2022, 7:42 AM

## 2022-02-06 NOTE — Progress Notes (Signed)
Able to get in contact with the director of Group home Dorna Leitz. States he can pick up pt this evening. Estimated to be here in about 30 min.
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
## 2022-02-06 NOTE — Care Management Important Message (Signed)
Important Message  Patient Details  Name: Eddie Berry MRN: [id number] Date of Birth: 30-Apr-1986   Medicare Important Message Given:  N/A - LOS <3 / Initial given by admissions     Juliann Pulse A Jericha Bryden 02/06/2022, 9:41 AM

## 2022-02-06 NOTE — TOC Transition Note (Signed)
Transition of Care [facility]) - CM/SW Discharge Note   Patient Details  Name: Eddie Berry MRN: [id number] Date of Birth: [birth year]  [facility] CM/SW Contact:  Laurena Slimmer, RN Phone Number: 02/06/2022, 4:07 PM   Clinical Narrative:    Patient discharging back to [facility].     Barriers to Discharge: Continued Medical Work up   Patient Goals and CMS Choice Patient states their goals for this hospitalization and ongoing recovery are:: Patient not fully oriented.      Discharge Placement                       Discharge Plan and Services     Post Acute Care Choice:  (TBD)                               Social Determinants of Health (SDOH) Interventions Food Insecurity Interventions: Intervention Not Indicated Housing Interventions: Intervention Not Indicated Transportation Interventions: Intervention Not Indicated Utilities Interventions: Intervention Not Indicated   Readmission Risk Interventions     No data to display

## 2022-02-06 NOTE — Discharge Summary (Addendum)
Physician Discharge Summary   Patient: Eddie Berry MRN: [id number] DOB: [birth year]  Admit date:     02/03/2022  Discharge date: 02/06/22  Discharge Physician: Sharen Hones   PCP: Lauretta Grill, NP   Recommendations at discharge:   Follow-up with PCP in 1 week.  Discharge Diagnoses: Principal Problem:   Medication management Active Problems:   Acute pulmonary embolism ([place])   CAP (community acquired pneumonia)   Lethargy   Acute respiratory failure with hypoxia (HCC)   Mood disorder (HCC)   TBI (traumatic brain injury) ([place])   Seizure ([place])   Hypothyroidism Hypokalemia.  Improved. Resolved Problems:   * No resolved hospital problems. *  Hospital Course: 36 year old man with history of traumatic brain injury and seizures and mood disorder.  Presented to the hospital weakness.  He was diagnosed with pneumonia and pulmonary emboli.  Currently on Rocephin and Zithromax and Eliquis.  In the afternoon on 02/04/2022 he was more lethargic.  On the morning of 02/06/2019.  He is more lethargic.  Depakote level normal.  EEG shows moderate diffuse encephalopathy of nonspecific etiology with no seizure or elliptic form discharges seen throughout the recording.  Guardian mentions they have been working him up for patient's sleep the episodes and following out for possible narcolepsy. Patient has been seen by neurology, does not believe patient had a seizure episode at time of admission.  Patient is also seen by psychiatry, recommended to continue home medicine without change.  Condition had improved today, he has been awake for the whole day, medically stable to be discharged. Discussed with patient legal guardian, patient has been falling off his head very frequently in the group home, he has high risk for brain bleed from fall.  Given small size of pulmonary emboli, patient will be not treated with anticoagulation.  Assessment and Plan: Acute pulmonary embolism ([place]) Initially placed on  Eliquis.  CT scan showed small volume acute pulmonary emboli within the right middle lobe and right lower lobe.  Ultrasound bilateral lower extremities negative for DVT.  Decision was made not to start anticoagulation due to frequent fall.  Legal guardian states that he always fell on his head making him very high risk for brain bleed on anticoagulation.   Lethargy Patient had a little bleeding episode last night and again this morning.  Did not do well with physical therapy in the afternoon.  Case discussed with guardian and he has been not engaging in his group home activities over the last 3 months.  He has been having episodes where he is more sleepy and falling out.  They are thinking he potentially could have narcolepsy.  We will consult neurology and psychiatry.  EEG does not show any seizures.  CT scan of the head ordered.  CAP (community acquired pneumonia) We will complete 5 days of antibiotics.  Acute hypoxemia Acute respiratory failure with hypoxia (HCC) ruled out Pulse ox of 70% as per admitting physician.  This could be a sudden onset of a PE with concurrent pneumonia.  Condition had improved, currently off oxygen. Patient had hypoxemia without any shortness of breath.   Mood disorder ([place]) Resume home medicines.  Patient is a seen by psychiatry, recommended to not change home medicines.  Hypothyroidism Continue Synthroid  Seizure ([place]) Continue Depakote, Vimpat and Lamictal.  Depakote level normal.  Patient is seen by neurology, EEG did not show any seizure activity.  Continue home medicines.  TBI (traumatic brain injury) [facility])         Consultants:  Neurology and psychiatry. Procedures performed: None  Disposition:  Group home Diet recommendation:  Discharge Diet Orders (From admission, onward)     Start     Ordered   02/06/22 0000  Diet - low sodium heart healthy        02/06/22 1511           Cardiac diet DISCHARGE MEDICATION: Allergies as of 02/06/2022        Reactions   Antihistamines, Diphenhydramine-type Other (See Comments)   UNK reaction   Aspirin Other (See Comments)   UNK reaction   Nsaids Other (See Comments)   Unk reaction   Prednisone Other (See Comments)   UNK reaction   Penicillins Rash        Medication List     TAKE these medications    acetaminophen 500 MG tablet Commonly known as: TYLENOL Take 500 mg by mouth every 6 (six) hours as needed.   albuterol 108 (90 Base) MCG/ACT inhaler Commonly known as: VENTOLIN HFA Inhale 2 puffs into the lungs every 6 (six) hours as needed for wheezing or shortness of breath.   azelastine 0.1 % nasal spray Commonly known as: ASTELIN Place 2 sprays into both nostrils as needed for rhinitis. Use in each nostril as directed   azithromycin 500 MG tablet Commonly known as: ZITHROMAX Take 1 tablet (500 mg total) by mouth daily for 2 days. Start taking on: February 07, 2022   cefadroxil 500 MG capsule Commonly known as: DURICEF Take 1 capsule (500 mg total) by mouth 2 (two) times daily for 2 days.   cetirizine 10 MG tablet Commonly known as: ZYRTEC Take 10 mg by mouth daily.   cloZAPine 25 MG tablet Commonly known as: CLOZARIL Take 75 mg by mouth daily.   clozapine 200 MG tablet Commonly known as: CLOZARIL Take 200 mg by mouth daily.   divalproex 250 MG DR tablet Commonly known as: DEPAKOTE Take 250 mg by mouth 2 (two) times daily.   divalproex 500 MG DR tablet Commonly known as: DEPAKOTE Take 500 mg by mouth 2 (two) times daily.   docusate sodium 100 MG capsule Commonly known as: COLACE Take 100 mg by mouth 2 (two) times daily.   fenofibrate 48 MG tablet Commonly known as: TRICOR Take 48 mg by mouth daily.   fluticasone 50 MCG/ACT nasal spray Commonly known as: FLONASE Place 2 sprays into both nostrils daily.   hydroxypropyl methylcellulose / hypromellose 2.5 % ophthalmic solution Commonly known as: ISOPTO TEARS / GONIOVISC Place 2 drops into the  right eye 4 (four) times daily.   lacosamide 200 MG Tabs tablet Commonly known as: VIMPAT Take 1 tablet (200 mg total) by mouth 2 (two) times daily.   lamoTRIgine 100 MG tablet Commonly known as: LAMICTAL Take 50 mg by mouth 2 (two) times daily.   lamoTRIgine 150 MG tablet Commonly known as: LAMICTAL Take 150 mg by mouth daily.   levothyroxine 25 MCG tablet Commonly known as: SYNTHROID Take 25 mcg by mouth daily before breakfast.   Melatonin 10 MG Tabs Take 1 tablet by mouth at bedtime.   Myrbetriq 50 MG Tb24 tablet Generic drug: mirabegron ER Take 50 mg by mouth daily.   omeprazole 20 MG capsule Commonly known as: PRILOSEC Take 20 mg by mouth daily.   paliperidone 6 MG 24 hr tablet Commonly known as: INVEGA Take 6 mg by mouth daily.   propranolol 10 MG tablet Commonly known as: INDERAL Take 10 mg by mouth daily.   sertraline  50 MG tablet Commonly known as: ZOLOFT Take 50 mg by mouth at bedtime.   sertraline 100 MG tablet Commonly known as: ZOLOFT Take 100 mg by mouth daily.   THEREMS PO Take 1 tablet by mouth daily.   traZODone 50 MG tablet Commonly known as: DESYREL Take 50 mg by mouth at bedtime.   Vitamin D3 50 MCG (2000 UT) Tabs Take 1 tablet by mouth daily.        Follow-up Information     Hatchett, Mary, NP Follow up in 1 week(s).   Specialty: Nurse Practitioner Contact information: [address] [address] [place] [address] [phone]                Discharge Exam: Filed Weights   02/03/22 1051  Weight: 96.6 kg   General exam: Appears calm and comfortable  Respiratory system: Clear to auscultation. Respiratory effort normal. Cardiovascular system: S1 & S2 heard, RRR. No JVD, murmurs, rubs, gallops or clicks. No pedal edema. Gastrointestinal system: Abdomen is nondistended, soft and nontender. No organomegaly or masses felt. Normal bowel sounds heard. Central nervous system: Alert and oriented. No focal neurological  deficits. Extremities: Symmetric 5 x 5 power. Skin: No rashes, lesions or ulcers Psychiatry: Judgement and insight appear normal. Mood & affect appropriate.    Condition at discharge: good  The results of significant diagnostics from this hospitalization (including imaging, microbiology, ancillary and laboratory) are listed below for reference.   Imaging Studies: CT HEAD WO CONTRAST (5MM)  Result Date: 02/05/2022 CLINICAL DATA:  Weakness, altered mental status. History of traumatic brain injury and seizures. EXAM: CT HEAD WITHOUT CONTRAST TECHNIQUE: Contiguous axial images were obtained from the base of the skull through the vertex without intravenous contrast. RADIATION DOSE REDUCTION: This exam was performed according to the departmental dose-optimization program which includes automated exposure control, adjustment of the mA and/or kV according to patient size and/or use of iterative reconstruction technique. COMPARISON:  CT head 12/17/2021 FINDINGS: Brain: There is no acute intracranial hemorrhage no extra-axial fluid collection, or acute infarct. Multifocal encephalomalacia in the bilateral frontal lobes, left temporal lobe, and right cerebellar hemisphere is unchanged, likely related to the history of traumatic brain injury. Mild ex vacuo dilatation of the ventricular system is unchanged. Background parenchymal volume is otherwise normal. There is no solid mass lesion. There is no mass effect or midline shift. Vascular: No hyperdense vessel or unexpected calcification. Skull: Stable postsurgical changes reflecting left craniotomy. There is no acute calvarial fracture or suspicious osseous lesion. Sinuses/Orbits: There is mild mucosal thickening in the left frontal sinus. The imaged globes and orbits are unremarkable. Other: None. IMPRESSION: Stable noncontrast head CT with no acute intracranial pathology. Electronically Signed   By: Valetta Mole M.D.   On: 02/05/2022 15:49   EEG adult  Result  Date: 02/05/2022 Lora Havens, MD     02/05/2022  1:37 PM Patient Name: Jahlon Odor MRN: [id number] Epilepsy Attending: Lora Havens Referring Physician/Provider: Loletha Grayer, MD Date: 02/05/2022 Duration: 26.58 mins Patient history: 36 year old male with history of seizures.  EEG to evaluate for seizure. Level of alertness: Awake AEDs during EEG study: VPA, LCM Technical aspects: This EEG study was done with scalp electrodes positioned according to the 10-20 International system of electrode placement. Electrical activity was reviewed with band pass filter of 1-70Hz , sensitivity of 7 uV/mm, display speed of 71mm/sec with a 60Hz  notched filter applied as appropriate. EEG data were recorded continuously and digitally stored.  Video monitoring was available and reviewed as  appropriate. Description: No clear posterior dominant rhythm was seen.  EEG showed continuous generalized predominantly 5 to 7 Hz theta slowing admixed with intermittent generalized 2 to 3 Hz delta slowing. Hyperventilation and photic stimulation were not performed.   ABNORMALITY - Continuous slow, generalized IMPRESSION: This study is suggestive of moderate diffuse encephalopathy, nonspecific etiology. No seizures or epileptiform discharges were seen throughout the recording. Of note, lack of epileptiform abnormality does not exclude the diagnosis of epilepsy. Priyanka [name]   US Venous Img Lower Bilateral (DVT)  Result Date: 02/04/2022 CLINICAL DATA:  Pulmonary embolism.  Evaluate for DVT. EXAM: BILATERAL LOWER EXTREMITY VENOUS DOPPLER ULTRASOUND TECHNIQUE: Gray-scale sonography with graded compression, as well as color Doppler and duplex ultrasound were performed to evaluate the lower extremity deep venous systems from the level of the common femoral vein and including the common femoral, femoral, profunda femoral, popliteal and calf veins including the posterior tibial, peroneal and gastrocnemius veins when visible. The  superficial great saphenous vein was also interrogated. Spectral Doppler was utilized to evaluate flow at rest and with distal augmentation maneuvers in the common femoral, femoral and popliteal veins. COMPARISON:  Chest CT-02/03/2022 FINDINGS: RIGHT LOWER EXTREMITY Common Femoral Vein: No evidence of thrombus. Normal compressibility, respiratory phasicity and response to augmentation. Saphenofemoral Junction: No evidence of thrombus. Normal compressibility and flow on color Doppler imaging. Profunda Femoral Vein: No evidence of thrombus. Normal compressibility and flow on color Doppler imaging. Femoral Vein: No evidence of thrombus. Normal compressibility, respiratory phasicity and response to augmentation. Popliteal Vein: No evidence of thrombus. Normal compressibility, respiratory phasicity and response to augmentation. Calf Veins: No evidence of thrombus. Normal compressibility and flow on color Doppler imaging. Superficial Great Saphenous Vein: No evidence of thrombus. Normal compressibility. Other Findings:  None. LEFT LOWER EXTREMITY Common Femoral Vein: No evidence of thrombus. Normal compressibility, respiratory phasicity and response to augmentation. Saphenofemoral Junction: No evidence of thrombus. Normal compressibility and flow on color Doppler imaging. Profunda Femoral Vein: No evidence of thrombus. Normal compressibility and flow on color Doppler imaging. Femoral Vein: No evidence of thrombus. Normal compressibility, respiratory phasicity and response to augmentation. Popliteal Vein: No evidence of thrombus. Normal compressibility, respiratory phasicity and response to augmentation. Calf Veins: No evidence of thrombus. Normal compressibility and flow on color Doppler imaging. Superficial Great Saphenous Vein: No evidence of thrombus. Normal compressibility. Other Findings:  None. IMPRESSION: No evidence of DVT within either lower extremity. Electronically Signed   By: Sandi Mariscal M.D.   On: 02/04/2022  11:27   CT Angio Chest PE W/Cm &/Or Wo Cm  Result Date: 02/03/2022 CLINICAL DATA:  Short of breath.  Concern for pulmonary embolism EXAM: CT ANGIOGRAPHY CHEST WITH CONTRAST TECHNIQUE: Multidetector CT imaging of the chest was performed using the standard protocol during bolus administration of intravenous contrast. Multiplanar CT image reconstructions and MIPs were obtained to evaluate the vascular anatomy. RADIATION DOSE REDUCTION: This exam was performed according to the departmental dose-optimization program which includes automated exposure control, adjustment of the mA and/or kV according to patient size and/or use of iterative reconstruction technique. CONTRAST:  126mL OMNIPAQUE IOHEXOL 350 MG/ML SOLN COMPARISON:  Radiograph same day FINDINGS: Cardiovascular: Thin filling defect extending to the posterior branch the superior segment RIGHT lobe pulmonary artery (image 61/4. Small thin filling defect within the proximal branch of the RIGHT middle lobe artery (image 64/4) overall clot burden is mild. No RIGHT ventricular strain. Mediastinum/Nodes: No axillary or supraclavicular adenopathy. No mediastinal or hilar adenopathy. No pericardial fluid. Esophagus normal.  Lungs/Pleura: There is segmental consolidation with air bronchograms in the RIGHT lower lobe (image 78/5. there is chronic elevation of the RIGHT hemidiaphragm which contributes to some atelectasis in the RIGHT lower lobe additionally. Upper Abdomen: Limited view of the liver, kidneys, pancreas are unremarkable. Normal adrenal glands. Musculoskeletal: No aggressive osseous lesion. Review of the MIP images confirms the above findings. IMPRESSION: 1. Small volume acute pulmonary emboli within the RIGHT middle lobe and RIGHT lower lobe pulmonary arteries. Overall tumor burden is minimal. No RIGHT ventricular strain. 2. Consolidation in the RIGHT lower lobe suggest pneumonia or resolving pneumonia. Elevation of the RIGHT hemidiaphragm contributes to  atelectasis in the RIGHT lower lobe. Electronically Signed   By: Suzy Bouchard M.D.   On: 02/03/2022 15:56   DG Chest Port 1 View  Result Date: 02/03/2022 CLINICAL DATA:  Questionable sepsis EXAM: PORTABLE CHEST 1 VIEW COMPARISON:  None Available. FINDINGS: Normal cardiac silhouette. Linear opacity over the RIGHT hemidiaphragm. LEFT lung clear. No pneumothorax. No acute osseous abnormality. IMPRESSION: RIGHT lower lobe atelectasis versus infiltrate. Electronically Signed   By: Suzy Bouchard M.D.   On: 02/03/2022 11:29    Microbiology: Results for orders placed or performed during the hospital encounter of 02/03/22  Blood Culture (routine x 2)     Status: None (Preliminary result)   Collection Time: 02/03/22 11:13 AM   Specimen: BLOOD RIGHT ARM  Result Value Ref Range Status   Specimen Description BLOOD RIGHT ARM  Final   Special Requests   Final    BOTTLES DRAWN AEROBIC AND ANAEROBIC Blood Culture adequate volume   Culture   Final    NO GROWTH 3 DAYS Performed at [facility], [address], [place], [place] [address]    Report Status PENDING  Incomplete  Urine Culture     Status: None   Collection Time: 02/03/22 11:13 AM   Specimen: In/Out Cath Urine  Result Value Ref Range Status   Specimen Description   Final    IN/OUT CATH URINE Performed at [facility], [address] [place], [place] [address]    Special Requests   Final    NONE Performed at [facility], [address] [place], [place] [address]    Culture   Final    NO GROWTH Performed at [facility], [place] [address], [place], [place] [address]    Report Status 02/04/2022 FINAL  Final  Blood Culture (routine x 2)     Status: None (Preliminary result)   Collection Time: 02/03/22 11:26 AM   Specimen: BLOOD  Result Value Ref Range Status   Specimen Description BLOOD BLOOD RIGHT FOREARM  Final   Special Requests   Final    BOTTLES DRAWN AEROBIC AND ANAEROBIC Blood Culture  results may not be optimal due to an inadequate volume of blood received in culture bottles   Culture   Final    NO GROWTH 3 DAYS Performed at [facility], [address] [place], [place] [address]    Report Status PENDING  Incomplete  SARS Coronavirus 2 by RT PCR (hospital order, performed in [place] hospital lab) *cepheid single result test* Anterior Nasal Swab     Status: None   Collection Time: 02/03/22 11:35 AM   Specimen: Anterior Nasal Swab  Result Value Ref Range Status   SARS Coronavirus 2 by RT PCR NEGATIVE NEGATIVE Final    Comment: (NOTE) SARS-CoV-2 target nucleic acids are NOT DETECTED.  The SARS-CoV-2 RNA is generally detectable  in upper and lower respiratory specimens during the acute phase of infection. The lowest concentration of SARS-CoV-2 viral copies this assay can detect is 250 copies / mL. A negative result does not preclude SARS-CoV-2 infection and should not be used as the sole basis for treatment or other patient management decisions.  A negative result may occur with improper specimen collection / handling, submission of specimen other than nasopharyngeal swab, presence of viral mutation(s) within the areas targeted by this assay, and inadequate number of viral copies (<250 copies / mL). A negative result must be combined with clinical observations, patient history, and epidemiological information.  Fact Sheet for Patients:   https://www.patel.info/  Fact Sheet for Healthcare Providers: https://hall.com/  This test is not yet approved or  cleared by the [place] FDA and has been authorized for detection and/or diagnosis of SARS-CoV-2 by FDA under an Emergency Use Authorization (EUA).  This EUA will remain in effect (meaning this test can be used) for the duration of the COVID-19 declaration under Section 564(b)(1) of the Act, 21 U.S.C. section 360bbb-3(b)(1), unless the authorization is  terminated or revoked sooner.  Performed at [facility], [address] [place], [place] [address]   Respiratory (~20 pathogens) panel by PCR     Status: None   Collection Time: 02/03/22  5:04 PM   Specimen: Nasopharyngeal Swab; Respiratory  Result Value Ref Range Status   Adenovirus NOT DETECTED NOT DETECTED Final   Coronavirus 229E NOT DETECTED NOT DETECTED Final    Comment: (NOTE) The Coronavirus on the Respiratory Panel, DOES NOT test for the novel  Coronavirus (2019 nCoV)    Coronavirus HKU1 NOT DETECTED NOT DETECTED Final   Coronavirus NL63 NOT DETECTED NOT DETECTED Final   Coronavirus OC43 NOT DETECTED NOT DETECTED Final   Metapneumovirus NOT DETECTED NOT DETECTED Final   Rhinovirus / Enterovirus NOT DETECTED NOT DETECTED Final   Influenza A NOT DETECTED NOT DETECTED Final   Influenza B NOT DETECTED NOT DETECTED Final   Parainfluenza Virus 1 NOT DETECTED NOT DETECTED Final   Parainfluenza Virus 2 NOT DETECTED NOT DETECTED Final   Parainfluenza Virus 3 NOT DETECTED NOT DETECTED Final   Parainfluenza Virus 4 NOT DETECTED NOT DETECTED Final   Respiratory Syncytial Virus NOT DETECTED NOT DETECTED Final   Bordetella pertussis NOT DETECTED NOT DETECTED Final   Bordetella Parapertussis NOT DETECTED NOT DETECTED Final   Chlamydophila pneumoniae NOT DETECTED NOT DETECTED Final   Mycoplasma pneumoniae NOT DETECTED NOT DETECTED Final    Comment: Performed at [facility], [place]. [address] [place], [place] [address]    Labs: CBC: Recent Labs  Lab 02/03/22 1113 02/04/22 0527 02/05/22 0457 02/06/22 0427  WBC 5.2 5.5 6.9 7.8  NEUTROABS  --   --  2.5  --   HGB 14.3 12.5* 12.6* 13.5  HCT 45.2 39.0 40.4 41.9  MCV 95.2 94.7 94.0 93.7  PLT 357 334 325 [id number]   Basic Metabolic Panel: Recent Labs  Lab 02/03/22 1113 02/04/22 0527 02/05/22 0457 02/06/22 0427  NA 144 143 143 141  K 3.8 3.4* 3.9 4.4  CL 107 110 106 107  CO2 25 26 29 27   GLUCOSE 97 96 86 95   BUN 7 9 12 15   CREATININE 1.07 0.78 0.82 0.85  CALCIUM 9.5 8.8* 9.1 9.6   Liver Function Tests: Recent Labs  Lab 02/03/22 1113 02/06/22 0427  AST 46* 25  ALT 25 18  ALKPHOS 62 51  BILITOT 0.7 0.4  PROT 8.1 7.3  ALBUMIN 4.3 3.7   CBG: Recent Labs  Lab 02/03/22 1051  GLUCAP 104*    Discharge time spent: greater than 30 minutes.  Signed: Sharen Hones, MD [facility] 02/06/2022

## 2022-02-06 NOTE — Plan of Care (Signed)

## 2022-02-08 LAB — CULTURE, BLOOD (ROUTINE X 2)
Culture: NO GROWTH
Culture: NO GROWTH
Special Requests: ADEQUATE

## 2022-02-28 ENCOUNTER — Ambulatory Visit (INDEPENDENT_AMBULATORY_CARE_PROVIDER_SITE_OTHER): Payer: Medicare Other | Admitting: Pulmonary Disease

## 2022-02-28 ENCOUNTER — Encounter: Payer: Self-pay | Admitting: Pulmonary Disease

## 2022-02-28 VITALS — BP 120/70 | HR 88 | Temp 98.0°F | Ht 72.0 in | Wt 200.0 lb

## 2022-02-28 DIAGNOSIS — G4719 Other hypersomnia: Secondary | ICD-10-CM

## 2022-02-28 NOTE — Progress Notes (Signed)
He was scheduled for appointment today to review his sleep study.  This hasn't been completed yet.  Will reschedule appointment after his sleep study is completed.  Chesley Mires, MD [place] Pager - [phone] 02/28/2022, 11:50 AM

## 2022-03-25 ENCOUNTER — Institutional Professional Consult (permissible substitution): Payer: Medicare Other | Admitting: Internal Medicine

## 2022-05-15 ENCOUNTER — Emergency Department: Payer: Medicare Other

## 2022-05-15 ENCOUNTER — Inpatient Hospital Stay: Payer: Medicare Other

## 2022-05-15 ENCOUNTER — Observation Stay
Admission: EM | Admit: 2022-05-15 | Discharge: 2022-05-16 | Payer: Medicare Other | Attending: Internal Medicine | Admitting: Internal Medicine

## 2022-05-15 DIAGNOSIS — Z7952 Long term (current) use of systemic steroids: Secondary | ICD-10-CM | POA: Diagnosis not present

## 2022-05-15 DIAGNOSIS — S069XAA Unspecified intracranial injury with loss of consciousness status unknown, initial encounter: Secondary | ICD-10-CM | POA: Diagnosis present

## 2022-05-15 DIAGNOSIS — Z79899 Other long term (current) drug therapy: Secondary | ICD-10-CM | POA: Insufficient documentation

## 2022-05-15 DIAGNOSIS — F39 Unspecified mood [affective] disorder: Secondary | ICD-10-CM | POA: Diagnosis not present

## 2022-05-15 DIAGNOSIS — E039 Hypothyroidism, unspecified: Secondary | ICD-10-CM | POA: Diagnosis present

## 2022-05-15 DIAGNOSIS — Z87891 Personal history of nicotine dependence: Secondary | ICD-10-CM | POA: Diagnosis not present

## 2022-05-15 DIAGNOSIS — E162 Hypoglycemia, unspecified: Secondary | ICD-10-CM | POA: Diagnosis not present

## 2022-05-15 DIAGNOSIS — G4719 Other hypersomnia: Secondary | ICD-10-CM | POA: Diagnosis present

## 2022-05-15 DIAGNOSIS — R4182 Altered mental status, unspecified: Secondary | ICD-10-CM | POA: Diagnosis present

## 2022-05-15 DIAGNOSIS — Z720 Tobacco use: Secondary | ICD-10-CM | POA: Insufficient documentation

## 2022-05-15 DIAGNOSIS — R569 Unspecified convulsions: Secondary | ICD-10-CM | POA: Diagnosis not present

## 2022-05-15 DIAGNOSIS — Z1152 Encounter for screening for COVID-19: Secondary | ICD-10-CM | POA: Insufficient documentation

## 2022-05-15 DIAGNOSIS — R404 Transient alteration of awareness: Principal | ICD-10-CM

## 2022-05-15 DIAGNOSIS — G459 Transient cerebral ischemic attack, unspecified: Secondary | ICD-10-CM | POA: Diagnosis not present

## 2022-05-15 LAB — URINE DRUG SCREEN, QUALITATIVE (ARMC ONLY)
Amphetamines, Ur Screen: NOT DETECTED
Barbiturates, Ur Screen: NOT DETECTED
Benzodiazepine, Ur Scrn: NOT DETECTED
Cannabinoid 50 Ng, Ur ~~LOC~~: NOT DETECTED
Cocaine Metabolite,Ur ~~LOC~~: NOT DETECTED
MDMA (Ecstasy)Ur Screen: NOT DETECTED
Methadone Scn, Ur: NOT DETECTED
Opiate, Ur Screen: NOT DETECTED
Phencyclidine (PCP) Ur S: NOT DETECTED
Tricyclic, Ur Screen: NOT DETECTED

## 2022-05-15 LAB — COMPREHENSIVE METABOLIC PANEL
ALT: 15 U/L (ref 0–44)
AST: 21 U/L (ref 15–41)
Albumin: 3.2 g/dL — ABNORMAL LOW (ref 3.5–5.0)
Alkaline Phosphatase: 41 U/L (ref 38–126)
Anion gap: 6 (ref 5–15)
BUN: 10 mg/dL (ref 6–20)
CO2: 19 mmol/L — ABNORMAL LOW (ref 22–32)
Calcium: 7 mg/dL — ABNORMAL LOW (ref 8.9–10.3)
Chloride: 117 mmol/L — ABNORMAL HIGH (ref 98–111)
Creatinine, Ser: 0.63 mg/dL (ref 0.61–1.24)
GFR, Estimated: >60 mL/min (ref >60)
Glucose, Bld: 64 mg/dL — ABNORMAL LOW (ref 70–99)
Potassium: 3.3 mmol/L — ABNORMAL LOW (ref 3.5–5.1)
Sodium: 142 mmol/L (ref 135–145)
Total Bilirubin: 0.4 mg/dL (ref 0.3–1.2)
Total Protein: 5.8 g/dL — ABNORMAL LOW (ref 6.5–8.1)

## 2022-05-15 LAB — APTT: aPTT: 28 seconds (ref 24–36)

## 2022-05-15 LAB — DIFFERENTIAL
Abs Immature Granulocytes: 0.03 10*3/uL (ref 0.00–0.07)
Basophils Absolute: 0 10*3/uL (ref 0.0–0.1)
Basophils Relative: 1 %
Eosinophils Absolute: 0.1 10*3/uL (ref 0.0–0.5)
Eosinophils Relative: 2 %
Immature Granulocytes: 1 %
Lymphocytes Relative: 43 %
Lymphs Abs: 2.6 10*3/uL (ref 0.7–4.0)
Monocytes Absolute: 0.7 10*3/uL (ref 0.1–1.0)
Monocytes Relative: 11 %
Neutro Abs: 2.5 10*3/uL (ref 1.7–7.7)
Neutrophils Relative %: 42 %

## 2022-05-15 LAB — URINALYSIS, COMPLETE (UACMP) WITH MICROSCOPIC
Bilirubin Urine: NEGATIVE
Glucose, UA: NEGATIVE mg/dL
Hgb urine dipstick: NEGATIVE
Ketones, ur: NEGATIVE mg/dL
Leukocytes,Ua: NEGATIVE
Nitrite: NEGATIVE
Protein, ur: NEGATIVE mg/dL
Specific Gravity, Urine: 1.011 (ref 1.005–1.030)
Squamous Epithelial / HPF: NONE SEEN /HPF (ref 0–5)
WBC, UA: NONE SEEN WBC/hpf (ref 0–5)
pH: 7 (ref 5.0–8.0)

## 2022-05-15 LAB — CBC
HCT: 41.3 % (ref 39.0–52.0)
Hemoglobin: 12.7 g/dL — ABNORMAL LOW (ref 13.0–17.0)
MCH: 30.8 pg (ref 26.0–34.0)
MCHC: 30.8 g/dL (ref 30.0–36.0)
MCV: 100.2 fL — ABNORMAL HIGH (ref 80.0–100.0)
Platelets: 291 10*3/uL (ref 150–400)
RBC: 4.12 MIL/uL — ABNORMAL LOW (ref 4.22–5.81)
RDW: 13.3 % (ref 11.5–15.5)
WBC: 5.9 10*3/uL (ref 4.0–10.5)
nRBC: 0 % (ref 0.0–0.2)

## 2022-05-15 LAB — CBG MONITORING, ED
Glucose-Capillary: 138 mg/dL — ABNORMAL HIGH (ref 70–99)
Glucose-Capillary: 85 mg/dL (ref 70–99)

## 2022-05-15 LAB — ETHANOL: Alcohol, Ethyl (B): <10 mg/dL (ref <10)

## 2022-05-15 LAB — PROTIME-INR
INR: 1 (ref 0.8–1.2)
Prothrombin Time: 13 seconds (ref 11.4–15.2)

## 2022-05-15 LAB — VALPROIC ACID LEVEL: Valproic Acid Lvl: 52 ug/mL (ref 50.0–100.0)

## 2022-05-15 LAB — GLUCOSE, CAPILLARY: Glucose-Capillary: 108 mg/dL — ABNORMAL HIGH (ref 70–99)

## 2022-05-15 LAB — RESP PANEL BY RT-PCR (RSV, FLU A&B, COVID)  RVPGX2
Influenza A by PCR: NEGATIVE
Influenza B by PCR: NEGATIVE
Resp Syncytial Virus by PCR: NEGATIVE
SARS Coronavirus 2 by RT PCR: NEGATIVE

## 2022-05-15 LAB — TSH: TSH: 1.602 u[IU]/mL (ref 0.350–4.500)

## 2022-05-15 LAB — MAGNESIUM: Magnesium: 2 mg/dL (ref 1.7–2.4)

## 2022-05-15 MED ORDER — DIVALPROEX SODIUM 500 MG PO DR TAB
750.0000 mg | DELAYED_RELEASE_TABLET | Freq: Two times a day (BID) | ORAL | Status: DC
  Filled 2022-05-15: qty 1

## 2022-05-15 MED ORDER — ACETAMINOPHEN 650 MG RE SUPP: 650.0000 mg | Freq: Four times a day (QID) | RECTAL | Status: DC | PRN

## 2022-05-15 MED ORDER — LORAZEPAM 2 MG/ML PO CONC: 2.0000 mg | ORAL | Status: DC

## 2022-05-15 MED ORDER — FENOFIBRATE 54 MG PO TABS
54.0000 mg | ORAL_TABLET | Freq: Every day | ORAL | Status: DC
  Administered 2022-05-16: 54 mg via ORAL
  Filled 2022-05-15: qty 1

## 2022-05-15 MED ORDER — DEXTROSE 50 % IV SOLN
12.5000 g | INTRAVENOUS | Status: AC
  Administered 2022-05-15: 12.5 g via INTRAVENOUS
  Filled 2022-05-15: qty 50

## 2022-05-15 MED ORDER — VITAMIN D 25 MCG (1000 UNIT) PO TABS
2000.0000 [IU] | ORAL_TABLET | Freq: Every day | ORAL | Status: DC
  Administered 2022-05-16: 2000 [IU] via ORAL
  Filled 2022-05-15: qty 2

## 2022-05-15 MED ORDER — POTASSIUM CHLORIDE 10 MEQ/100ML IV SOLN
10.0000 meq | INTRAVENOUS | Status: AC
  Administered 2022-05-15 (×3): 10 meq via INTRAVENOUS
  Filled 2022-05-15 (×3): qty 100

## 2022-05-15 MED ORDER — LEVOTHYROXINE SODIUM 25 MCG PO TABS: 25.0000 ug | ORAL_TABLET | Freq: Every day | ORAL | Status: DC

## 2022-05-15 MED ORDER — FLUTICASONE PROPIONATE 50 MCG/ACT NA SUSP: 2.0000 | Freq: Every day | NASAL | Status: DC | PRN

## 2022-05-15 MED ORDER — PALIPERIDONE ER 3 MG PO TB24: 6.0000 mg | ORAL_TABLET | Freq: Every morning | ORAL | Status: DC

## 2022-05-15 MED ORDER — LACOSAMIDE 50 MG PO TABS
200.0000 mg | ORAL_TABLET | Freq: Two times a day (BID) | ORAL | Status: DC
  Administered 2022-05-16: 200 mg via ORAL
  Filled 2022-05-15: qty 4

## 2022-05-15 MED ORDER — ONDANSETRON HCL 4 MG PO TABS: 4.0000 mg | ORAL_TABLET | Freq: Four times a day (QID) | ORAL | Status: DC | PRN

## 2022-05-15 MED ORDER — DIVALPROEX SODIUM 500 MG PO DR TAB: 1000.0000 mg | DELAYED_RELEASE_TABLET | Freq: Two times a day (BID) | ORAL | Status: DC

## 2022-05-15 MED ORDER — SODIUM CHLORIDE 0.9% FLUSH
3.0000 mL | Freq: Once | INTRAVENOUS | Status: AC
  Administered 2022-05-15: 3 mL via INTRAVENOUS

## 2022-05-15 MED ORDER — LEVETIRACETAM IN NACL 1000 MG/100ML IV SOLN
1000.0000 mg | Freq: Once | INTRAVENOUS | Status: AC
  Filled 2022-05-15: qty 200

## 2022-05-15 MED ORDER — ALBUTEROL SULFATE (2.5 MG/3ML) 0.083% IN NEBU: 3.0000 mL | INHALATION_SOLUTION | Freq: Four times a day (QID) | RESPIRATORY_TRACT | Status: DC | PRN

## 2022-05-15 MED ORDER — LORAZEPAM 2 MG/ML IJ SOLN: 2.0000 mg | INTRAMUSCULAR | Status: DC | PRN

## 2022-05-15 MED ORDER — MELATONIN 5 MG PO TABS: 10.0000 mg | ORAL_TABLET | Freq: Every day | ORAL | Status: DC

## 2022-05-15 MED ORDER — SUCCINYLCHOLINE CHLORIDE 200 MG/10ML IV SOSY
PREFILLED_SYRINGE | INTRAVENOUS | Status: AC
  Filled 2022-05-15: qty 10

## 2022-05-15 MED ORDER — PROPRANOLOL HCL 10 MG PO TABS
10.0000 mg | ORAL_TABLET | Freq: Every day | ORAL | Status: DC
  Administered 2022-05-16: 10 mg via ORAL
  Filled 2022-05-15: qty 1

## 2022-05-15 MED ORDER — LORAZEPAM 2 MG/ML IJ SOLN
INTRAMUSCULAR | Status: AC
  Administered 2022-05-15: 2 mg via INTRAVENOUS
  Filled 2022-05-15: qty 1

## 2022-05-15 MED ORDER — ACETAMINOPHEN 325 MG PO TABS: 650.0000 mg | ORAL_TABLET | Freq: Four times a day (QID) | ORAL | Status: DC | PRN

## 2022-05-15 MED ORDER — SERTRALINE HCL 50 MG PO TABS: 150.0000 mg | ORAL_TABLET | Freq: Every day | ORAL | Status: DC

## 2022-05-15 MED ORDER — SENNOSIDES-DOCUSATE SODIUM 8.6-50 MG PO TABS: 1.0000 | ORAL_TABLET | Freq: Every evening | ORAL | Status: DC | PRN

## 2022-05-15 MED ORDER — LEVETIRACETAM IN NACL 1000 MG/100ML IV SOLN
1000.0000 mg | Freq: Once | INTRAVENOUS | Status: AC
  Administered 2022-05-15: 1000 mg via INTRAVENOUS

## 2022-05-15 MED ORDER — LACOSAMIDE 50 MG PO TABS: 200.0000 mg | ORAL_TABLET | Freq: Two times a day (BID) | ORAL | Status: DC

## 2022-05-15 MED ORDER — HEPARIN SODIUM (PORCINE) 5000 UNIT/ML IJ SOLN
5000.0000 [IU] | Freq: Three times a day (TID) | INTRAMUSCULAR | Status: DC
  Administered 2022-05-16 (×2): 5000 [IU] via SUBCUTANEOUS
  Filled 2022-05-15 (×2): qty 1

## 2022-05-15 MED ORDER — INSULIN ASPART 100 UNIT/ML IJ SOLN
0.0000 [IU] | Freq: Three times a day (TID) | INTRAMUSCULAR | Status: DC
  Administered 2022-05-16: 2 [IU] via SUBCUTANEOUS
  Filled 2022-05-15: qty 1

## 2022-05-15 MED ORDER — LORAZEPAM 2 MG/ML IJ SOLN: 2.0000 mg | Freq: Once | INTRAMUSCULAR | Status: AC

## 2022-05-15 MED ORDER — INSULIN ASPART 100 UNIT/ML IJ SOLN: 0.0000 [IU] | Freq: Every day | INTRAMUSCULAR | Status: DC

## 2022-05-15 MED ORDER — NICOTINE 14 MG/24HR TD PT24: 14.0000 mg | MEDICATED_PATCH | Freq: Every day | TRANSDERMAL | Status: DC | PRN

## 2022-05-15 MED ORDER — PANTOPRAZOLE SODIUM 40 MG PO TBEC
40.0000 mg | DELAYED_RELEASE_TABLET | Freq: Every day | ORAL | Status: DC
  Administered 2022-05-16: 40 mg via ORAL
  Filled 2022-05-15: qty 1

## 2022-05-15 MED ORDER — ETOMIDATE 2 MG/ML IV SOLN
INTRAVENOUS | Status: AC
  Filled 2022-05-15: qty 10

## 2022-05-15 MED ORDER — LORAZEPAM 2 MG/ML PO CONC: 1.0000 mg | ORAL | Status: DC

## 2022-05-15 MED ORDER — LEVETIRACETAM IN NACL 1000 MG/100ML IV SOLN
INTRAVENOUS | Status: AC
  Administered 2022-05-15: 1000 mg via INTRAVENOUS
  Filled 2022-05-15: qty 200

## 2022-05-15 MED ORDER — ONDANSETRON HCL 4 MG/2ML IJ SOLN: 4.0000 mg | Freq: Four times a day (QID) | INTRAMUSCULAR | Status: DC | PRN

## 2022-05-15 MED ORDER — LORAZEPAM 2 MG/ML IJ SOLN
INTRAMUSCULAR | Status: AC
  Administered 2022-05-15: 1 mg via INTRAVENOUS
  Filled 2022-05-15: qty 1

## 2022-05-15 MED ORDER — LAMOTRIGINE 25 MG PO TABS: 150.0000 mg | ORAL_TABLET | Freq: Two times a day (BID) | ORAL | Status: DC

## 2022-05-15 MED ORDER — CLOZAPINE 100 MG PO TABS: 200.0000 mg | ORAL_TABLET | Freq: Every day | ORAL | Status: DC

## 2022-05-15 MED ORDER — TRAZODONE HCL 50 MG PO TABS: 50.0000 mg | ORAL_TABLET | Freq: Every day | ORAL | Status: DC

## 2022-05-15 MED ORDER — VALPROATE SODIUM 100 MG/ML IV SOLN
750.0000 mg | Freq: Once | INTRAVENOUS | Status: AC
  Administered 2022-05-16: 750 mg via INTRAVENOUS
  Filled 2022-05-15: qty 7.5

## 2022-05-15 MED ORDER — LORAZEPAM 2 MG/ML IJ SOLN: 1.0000 mg | Freq: Once | INTRAMUSCULAR | Status: AC

## 2022-05-15 MED ORDER — SODIUM CHLORIDE 0.9 % IV SOLN
200.0000 mg | Freq: Once | INTRAVENOUS | Status: AC
  Administered 2022-05-16: 200 mg via INTRAVENOUS
  Filled 2022-05-15: qty 20

## 2022-05-15 MED ORDER — DIVALPROEX SODIUM 250 MG PO DR TAB
750.0000 mg | DELAYED_RELEASE_TABLET | Freq: Two times a day (BID) | ORAL | Status: DC
  Administered 2022-05-16: 750 mg via ORAL
  Filled 2022-05-15: qty 3

## 2022-05-15 MED ORDER — SODIUM CHLORIDE 0.9 % IV BOLUS
1000.0000 mL | Freq: Once | INTRAVENOUS | Status: AC
  Administered 2022-05-15: 1000 mL via INTRAVENOUS

## 2022-05-15 NOTE — H&P (Addendum)
History and Physical   Eddie Berry UKG:[id number] DOB: March 08, 1986 DOA: 05/15/2022  PCP: Lauretta Grill, NP  Outpatient Specialists: Dr. [facility] Patient coming from: Group home via EMS  I have personally briefly reviewed patient's old medical records in [place].  Chief Concern: Seizure  HPI: Mr. Eddie Berry is a 37 year old male with history of TBI, seizure, tobacco use, who presents emergency department for chief concerns of seizure.  Patient resides in a group home.  Initial vitals in the ED showed temperature of 98, respiration rate 20, heart rate 94, blood pressure 124/86, SpO2 97% on room air.  Serum sodium is 142, potassium 3.3, chloride 117, bicarb 19, BUN of 10, serum creatinine of 0.63, nonfasting blood glucose 64, WBC 5.9, hemoglobin 12.7, platelets of 291.  CT head of stroke without contrast: Was read as no acute intracranial abnormality.  Multiple areas of chronic encephalomalacia bilaterally consistent with traumatic brain injury.  CT cervical spine without contrast: Was read as no recent fracture is seen.  Cervical spondylosis with spinal stenosis and encroachment of neural foramina at multiple levels.  ED treatment: Ativan, Keppra 1000 mg x 2, sodium chloride 1 L bolus, potassium chloride 10 mill equivalent IV, 3 doses ordered. ------------------------ At bedside patient was able to tell me his name, his age, he knows he is in the hospital.  He is denies chest pain, shortness of breath, nausea, vomiting, dysuria, hematuria.  He reports he does not know if he is taking his seizure medication as prescribed.  Patient was able to follow commands and have strength equal bilaterally.  Social history: He lives in a group home.  He states he no longer smokes tobacco products however is thinking about resuming it.  He denies EtOH or recreational drug use.  ROS: Constitutional: no weight change, no fever ENT/Mouth: no sore throat, no  rhinorrhea Eyes: no eye pain, no vision changes Cardiovascular: no chest pain, no dyspnea,  no edema, no palpitations Respiratory: no cough, no sputum, no wheezing Gastrointestinal: no nausea, no vomiting, no diarrhea, no constipation Genitourinary: no urinary incontinence, no dysuria, no hematuria Musculoskeletal: no arthralgias, no myalgias Skin: no skin lesions, no pruritus, Neuro: + weakness, no loss of consciousness, no syncope Psych: no anxiety, no depression, no decrease appetite Heme/Lymph: no bruising, no bleeding  ED Course: Discussed with emergency medicine provider, patient requiring hospitalization for chief concerns of breakthrough seizures.  Assessment/Plan  Principal Problem:   Seizure ([place]) Active Problems:   Mood disorder ([facility])   TBI (traumatic brain injury) ([place])   Excessive daytime sleepiness   Hypothyroidism   Tobacco use   Hypoglycemia   Assessment and Plan:  * Seizure [facility]) - Discussed case with neurology specialist who recommends admitting patient to the hospital overnight for continued monitoring - Neurology will continue to follow in the a.m. for reassessment and medication changes as needed - Repeat EEG in the morning - I have ordered UA with microscopy, TSH, magnesium level, B12 level, and portable stat chest x-ray to rule out infectious etiology versus other etiology that can be corrected - Neurology specialists will reevaluate patient and determine if patient requires EEG continuous monitoring - Resumed home seizure medication: Depakote 1000 mg p.o. twice daily, lacosamide 200 mg p.o. twice daily, lamotrigine 150 mg p.o. twice daily - Ativan 2 mg IV as needed for seizure, 2 doses ordered with instructions to administer as appropriate and then let provider know - Fall precautions, seizure precautions, aspiration precautions  Mood disorder ([place]) - Resumed  home paliperidone 6 every morning, sertraline 100 mg daily, trazodone 50 mg nightly, clozapine  200 mg daily - If continuous EEG is negative, I would recommend rounding provider to consult behavioral health for conversion disorder  Hypoglycemia - Check TSH - hypoglycemia protocol order set utilized - Insulin SSI with HS coveraged ordered and CBG TID AC  Tobacco use - As needed nicotine patch ordered  Hypothyroidism - Resumed home levothyroxine 25 mcg daily before breakfast  Chart reviewed.   DVT prophylaxis: Heparin 5000 units subcutaneous every 8 hours Code Status: Full code Diet: N.p.o. except for sips with meds Family Communication: No Disposition Plan: Pending clinical course and neurology evaluation Consults called: Neurology service Admission status: Telemetry medical, inpatient  Past Medical History:  Diagnosis Date   Seizures ([place])    TBI (traumatic brain injury) ([place])    Past Surgical History:  Procedure Laterality Date   ANKLE SURGERY     APPENDECTOMY     BRAIN SURGERY     LUNG SURGERY     Social History:  reports that he quit smoking about 2 years ago. His smoking use included cigarettes. He has a 22.00 pack-year smoking history. He has never used smokeless tobacco. He reports that he does not currently use alcohol. He reports that he does not currently use drugs.  Allergies  Allergen Reactions   Antihistamines, Diphenhydramine-Type Other (See Comments)    UNK reaction   Aspirin Other (See Comments)    UNK reaction   Nsaids Other (See Comments)    Unk reaction   Prednisone Other (See Comments)    UNK reaction   Penicillins Rash   No family history on file. Family history: Family history reviewed and not pertinent  Prior to Admission medications   Medication Sig Start Date End Date Taking? Authorizing Provider  acetaminophen (TYLENOL) 500 MG tablet Take 500 mg by mouth every 6 (six) hours as needed.    [provider]  albuterol (VENTOLIN HFA) 108 (90 Base) MCG/ACT inhaler Inhale 2 puffs into the lungs every 6 (six) hours as needed for  wheezing or shortness of breath.    [provider]  azelastine (ASTELIN) 0.1 % nasal spray Place 2 sprays into both nostrils as needed for rhinitis. Use in each nostril as directed    [provider]  cetirizine (ZYRTEC) 10 MG tablet Take 10 mg by mouth daily.    [provider]  Cholecalciferol (VITAMIN D3) 50 MCG (2000 UT) TABS Take 1 tablet by mouth daily.    [provider]  clozapine (CLOZARIL) 200 MG tablet Take 200 mg by mouth daily.    [provider]  cloZAPine (CLOZARIL) 25 MG tablet Take 75 mg by mouth daily.    [provider]  divalproex (DEPAKOTE) 250 MG DR tablet Take 250 mg by mouth 2 (two) times daily.    [provider]  divalproex (DEPAKOTE) 500 MG DR tablet Take 500 mg by mouth 2 (two) times daily.    [provider]  docusate sodium (COLACE) 100 MG capsule Take 100 mg by mouth 2 (two) times daily.    [provider]  fenofibrate (TRICOR) 48 MG tablet Take 48 mg by mouth daily.    [provider]  fluticasone (FLONASE) 50 MCG/ACT nasal spray Place 2 sprays into both nostrils daily.    [provider]  hydroxypropyl methylcellulose / hypromellose (ISOPTO TEARS / GONIOVISC) 2.5 % ophthalmic solution Place 2 drops into the right eye 4 (four) times daily.  [provider]  lacosamide (VIMPAT) 200 MG TABS tablet Take 1 tablet (200 mg total) by mouth 2 (two) times daily. 07/04/21   Harvest Dark, MD  lamoTRIgine (LAMICTAL) 100 MG tablet Take 50 mg by mouth 2 (two) times daily.    [provider]  lamoTRIgine (LAMICTAL) 150 MG tablet Take 150 mg by mouth daily.    [provider]  levothyroxine (SYNTHROID) 25 MCG tablet Take 25 mcg by mouth daily before breakfast.    [provider]  Melatonin 10 MG TABS Take 1 tablet by mouth at bedtime.    [provider]  mirabegron ER (MYRBETRIQ) 50 MG TB24 tablet Take 50 mg by mouth daily.     [provider]  Multiple Vitamin (THEREMS PO) Take 1 tablet by mouth daily.    [provider]  omeprazole (PRILOSEC) 20 MG capsule Take 20 mg by mouth daily.    [provider]  paliperidone (INVEGA) 6 MG 24 hr tablet Take 6 mg by mouth daily.    [provider]  propranolol (INDERAL) 10 MG tablet Take 10 mg by mouth daily.    [provider]  sertraline (ZOLOFT) 100 MG tablet Take 100 mg by mouth daily.    [provider]  sertraline (ZOLOFT) 50 MG tablet Take 50 mg by mouth at bedtime.    [provider]  traZODone (DESYREL) 50 MG tablet Take 50 mg by mouth at bedtime.    [provider]   Physical Exam: Vitals:   05/15/22 1730 05/15/22 1800 05/15/22 1809 05/15/22 1830  BP: 96/62 103/74 123/80 123/80  Pulse: (!) 107 87 86 86  Resp: (!) 21 (!) 23 (!) 23 (!) 23  Temp:  (!) 97.5 F (36.4 C) (!) 97.5 F (36.4 C)   TempSrc:      SpO2:  96% 94% 94%   Constitutional: appears age appropriate, NAD, calm, comfortable Eyes: PERRL, lids and conjunctivae normal ENMT: Mucous membranes are moist. Posterior pharynx clear of any exudate or lesions. Age-appropriate dentition. Hearing appropriate/ Neck: normal, supple, no masses, no thyromegaly Respiratory: clear to auscultation bilaterally, no wheezing, no crackles. Normal respiratory effort. No accessory muscle use.  Cardiovascular: Regular rate and rhythm, no murmurs / rubs / gallops. No extremity edema. 2+ pedal pulses. No carotid bruits.  Abdomen: no tenderness, no masses palpated, no hepatosplenomegaly. Bowel sounds positive.  Musculoskeletal: no clubbing / cyanosis. No joint deformity upper and lower extremities. Good ROM, no contractures, no atrophy. Normal muscle tone.  Skin: no rashes, lesions, ulcers. No induration Neurologic: Sensation intact. Strength 5/5 in all 4.  Psychiatric: Unable to appropriate  assess judgment and insight. Alert and oriented x 3. Depressed  mood.   EKG: independently reviewed, showing sinus rhythm with rate of 92, QTc 435  Chest x-ray on Admission: I personally reviewed and I agree with radiologist reading as below.  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  EEG to evaluate for seizure Level of alertness: Awake AEDs during EEG study: None Technical aspects: This EEG study was done with scalp electrodes positioned according to the 10-20 International system of electrode placement. Electrical activity was reviewed  with band pass filter of 1-70Hz , sensitivity of 7 uV/mm, display speed of 41mm/sec with a 60Hz  notched filter applied as appropriate. EEG data were recorded continuously and digitally stored.  Video monitoring was available and reviewed as appropriate. Description: The posterior dominant rhythm consists of 8 Hz activity of moderate voltage (25-35 uV) seen predominantly in posterior head regions, symmetric and reactive to eye opening and eye closing.  Hyperventilation and photic stimulation were not performed.   IMPRESSION: This study is within normal limits. No seizures or epileptiform discharges were seen throughout the recording. A normal interictal EEG does not exclude the diagnosis of epilepsy. Lora Havens   CT Cervical Spine Wo Contrast  Result Date: 05/15/2022 CLINICAL DATA:  Trauma EXAM: CT CERVICAL SPINE WITHOUT CONTRAST TECHNIQUE: Multidetector CT imaging of the cervical spine was performed without intravenous contrast. Multiplanar CT image reconstructions were also generated. RADIATION DOSE REDUCTION: This exam was performed according to  the departmental dose-optimization program which includes automated exposure control, adjustment of the mA and/or kV according to patient size and/or use of iterative reconstruction technique. COMPARISON:  07/11/2021 FINDINGS: Alignment: Alignment of posterior margins of vertebral bodies is unremarkable. There is reversal of lordosis. Skull base and vertebrae: No recent fracture is seen. There is partial fusion of C2 and C3 vertebrae. Degenerative changes are noted with bony spurs at multiple levels. Soft tissues and spinal canal: There is extrinsic pressure over the ventral margin of thecal sac caused by posterior bony spurs, more so at C6-C7 and C7-T1 levels with spinal stenosis. Disc levels: There is encroachment of neural foramina at multiple levels, more so at C6-C7 and C7-T1 levels. Upper chest: Unremarkable. Other: None. IMPRESSION: No recent fracture is seen. Cervical spondylosis with spinal stenosis and encroachment of neural foramina at multiple levels as described in the body of the report. Electronically Signed   By: Elmer Picker M.D.   On: 05/15/2022 15:15   CT HEAD CODE STROKE WO CONTRAST  Result Date: 05/15/2022 CLINICAL DATA:  Code stroke. Acute neuro deficit. History of traumatic brain injury. EXAM: CT HEAD WITHOUT CONTRAST TECHNIQUE: Contiguous axial images were obtained from the base of the skull through the vertex without intravenous contrast. RADIATION DOSE REDUCTION: This exam was performed according to the departmental dose-optimization program which includes automated exposure control, adjustment of the mA and/or kV according to patient size and/or use of iterative reconstruction technique. COMPARISON:  CT head 02/10/2022 FINDINGS: Brain: Multiple areas of chronic encephalomalacia including the right frontal lobe, left frontal lobe, left temporal lobe. 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 Negative for acute infarct, hemorrhage, mass Vascular:  Negative for hyperdense vessel Skull: Left frontal parietal craniotomy. Sinuses/Orbits: Paranasal sinuses clear. Metal prosthesis in the right eyelid causing artifact. Other: None ASPECTS ([place] Stroke Program Early CT Score) - Ganglionic level infarction (caudate, lentiform nuclei, internal capsule, insula, M1-M3 cortex): 7 - Supraganglionic infarction (M4-M6 cortex): 3 Total score (0-10 with 10 being normal): 10 IMPRESSION: 1. No acute intracranial abnormality. 2. Multiple areas of chronic encephalomalacia bilaterally consistent with traumatic brain injury. 3. Aspects is 10. 4. These results were called by telephone at the time of interpretation on 05/15/2022 at 3:04 pm to provider Duffy Bruce , who verbally acknowledged these results. Electronically Signed   By: Franchot Gallo M.D.   On: 05/15/2022 15:05    Labs on Admission: I have personally reviewed following labs  CBC: Recent Labs  Lab 05/15/22 1535  WBC 5.9  NEUTROABS 2.5  HGB 12.7*  HCT 41.3  MCV 100.2*  PLT 291   Basic Metabolic Panel: Recent Labs  Lab 05/15/22 1535  NA 142  K 3.3*  CL 117*  CO2 19*  GLUCOSE 64*  BUN 10  CREATININE 0.63  CALCIUM 7.0*   GFR: CrCl cannot be calculated (Unknown ideal weight.).  Liver Function Tests: Recent Labs  Lab 05/15/22 1535  AST 21  ALT 15  ALKPHOS 41  BILITOT 0.4  PROT 5.8*  ALBUMIN 3.2*   Coagulation Profile: Recent Labs  Lab 05/15/22 1621  INR 1.0   CBG: Recent Labs  Lab 05/15/22 1449 05/15/22 1919  GLUCAP 138* 85   Urine analysis:    Component Value Date/Time   COLORURINE AMBER (A) 02/03/2022 1113   APPEARANCEUR CLOUDY (A) 02/03/2022 1113   LABSPEC 1.027 02/03/2022 1113   PHURINE 6.0 02/03/2022 1113   GLUCOSEU NEGATIVE 02/03/2022 1113   HGBUR NEGATIVE 02/03/2022 1113   BILIRUBINUR NEGATIVE 02/03/2022 1113   KETONESUR NEGATIVE 02/03/2022 1113   PROTEINUR 100 (A) 02/03/2022 1113   NITRITE NEGATIVE 02/03/2022 1113   LEUKOCYTESUR NEGATIVE 02/03/2022  1113   This document was prepared using Dragon Voice Recognition software and may include unintentional dictation errors.  Dr. Sedalia Muta [facility]  If 7PM-7AM, please contact overnight-coverage provider If 7AM-7PM, please contact day coverage provider [email or web address]  05/15/2022, 7:22 PM

## 2022-05-15 NOTE — Progress Notes (Signed)
   05/15/22 1500  Spiritual Encounters  Type of Visit Initial  Care provided to: Pt not available  Referral source Code page  Reason for visit Code  OnCall Visit Yes   Chaplain responded to Code Stroke. Patient at CT scan and no family present. Chaplain services available upon request.

## 2022-05-15 NOTE — ED Notes (Signed)
Pt sleeping in bed, responsive to voice, RN unable to understand words, resp even and unlab

## 2022-05-15 NOTE — ED Notes (Signed)
RN told by staff walking in hall that this pt was up out of bed with his pants off. RN and pod RN approached the room and pt pulling off cords, took out IV, out of bed at the end of the bed. When RN asked what he is doing out of bed, the pt replied, but RN unable to understand what he is saying. Pt still further trying to get out of bed. Both RN's trying to assist pt back into bed and pt started falling to the ground. RN's assisted him to the ground and he fell onto his left side o his hip/leg and arm/shoulder. Pt apologizing to RN's about falling. Able to understand and follow directions to get back into bed. RN changed sheets that had urine in them. Unknown when pt wet the bed. RN got pt into new gown and socks. Pt adjusted in bed.    RN told charge RN about fall. Rn informed provider about fall. RN informed provider, [name], [employer], that pt did not his head and was assisted to ground. MD to bedside. Pt not unresponsive like when he first came in during triage. Not opening eyes or responding to sternal rub/pain. Provider stated he was going to speak with neurology.    Provider back to room and stated we would monitor the pt and he would be admitted.    2 new IV's started on the pt. Started back on IV meds. Bed alarm set. Bed placed in lowest position again. Call light within reach. Taught to stay in bed.

## 2022-05-15 NOTE — ED Provider Notes (Signed)
[facility] Provider Note    Event Date/Time   First MD Initiated Contact with Patient 05/15/22 1455     (approximate)   History   Altered Mental Status   HPI  Zakariyya Helfman is a 37 y.o. male  here with AMS. Pt arrives via EMS. He arrives altered and minimally responsive. Report from EMS was that he was in usual state of health today, walking into a store when he abruptly turned around, sat down. He then went unresponsive. He has since been minimally responsive. Arrives with EMS.   Level 5 caveat invoked as remainder of history, ROS, and physical exam limited due to patient's AMS.    Physical Exam   Triage Vital Signs: ED Triage Vitals [05/15/22 1431]  Enc Vitals Group     BP 124/86     Pulse Rate 94     Resp 20     Temp 98 F (36.7 C)     Temp Source Oral     SpO2 97 %     Weight      Height      Head Circumference      Peak Flow      Pain Score      Pain Loc      Pain Edu?      Excl. in GC?     Most recent vital signs: Vitals:   05/15/22 1809 05/15/22 1830  BP: 123/80 123/80  Pulse: 86 86  Resp: (!) 23 (!) 23  Temp: (!) 97.5 F (36.4 C)   SpO2: 94% 94%     General: Awake, no distress.  CV:  Good peripheral perfusion. RRR. No murmurs, rubs, or gallops. Resp:  Normal effort.  Abd:  No distention.  Other:  GCS 7-8 on arrival. Intermittent rightward gaze and eye deviation. No gag on arrival but coughing after ativan. No spontaneous movement noted.    ED Results / Procedures / Treatments   Labs (all labs ordered are listed, but only abnormal results are displayed) Labs Reviewed  CBC - Abnormal; Notable for the following components:      Result Value   RBC 4.12 (*)    Hemoglobin 12.7 (*)    MCV 100.2 (*)    All other components within normal limits  COMPREHENSIVE METABOLIC PANEL - Abnormal; Notable for the following components:   Potassium 3.3 (*)    Chloride 117 (*)    CO2 19 (*)    Glucose, Bld 64 (*)    Calcium  7.0 (*)    Total Protein 5.8 (*)    Albumin 3.2 (*)    All other components within normal limits  URINALYSIS, COMPLETE (UACMP) WITH MICROSCOPIC - Abnormal; Notable for the following components:   Color, Urine YELLOW (*)    APPearance HAZY (*)    Bacteria, UA RARE (*)    All other components within normal limits  CBG MONITORING, ED - Abnormal; Notable for the following components:   Glucose-Capillary 138 (*)    All other components within normal limits  RESP PANEL BY RT-PCR (RSV, FLU A&B, COVID)  RVPGX2  DIFFERENTIAL  ETHANOL  VALPROIC ACID LEVEL  PROTIME-INR  APTT  URINE DRUG SCREEN, QUALITATIVE ([facility] ONLY)  MAGNESIUM  TSH  LACOSAMIDE  LAMOTRIGINE LEVEL  BASIC METABOLIC PANEL  CBC  VITAMIN B12  HEMOGLOBIN A1C  CBG MONITORING, ED     EKG Sinus tachycardia, VR 101. PR 141, QRS 100. Normal sinus rhythm, no acute st elevations.  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   PROCEDURES:  Critical Care performed: Yes, see critical care procedure note(s)  .Critical Care  Performed by: Duffy Bruce, MD Authorized by: Duffy Bruce, MD   Critical care provider statement:    Critical care time (minutes):  30   Critical care time was exclusive of:  Separately billable procedures and treating other patients   Critical care was necessary to treat or prevent imminent or life-threatening deterioration of the following conditions:  Circulatory failure, cardiac failure and CNS failure or compromise   Critical care was time spent personally by me on the following activities:  Development of treatment plan with patient or surrogate, discussions with consultants, evaluation of patient's response to treatment, examination of patient, ordering and review of laboratory studies, ordering and review of radiographic studies, ordering and performing treatments  and interventions, pulse oximetry, re-evaluation of patient's condition and review of old [facility] ED: Medications  succinylcholine (ANECTINE) 200 MG/10ML syringe (  Not Given 05/15/22 1455)  etomidate (AMIDATE) 2 MG/ML injection (  Not Given 05/15/22 1456)  levothyroxine (SYNTHROID) tablet 25 mcg (has no administration in time range)  acetaminophen (TYLENOL) tablet 650 mg (has no administration in time range)    Or  acetaminophen (TYLENOL) suppository 650 mg (has no administration in time range)  ondansetron (ZOFRAN) tablet 4 mg (has no administration in time range)    Or  ondansetron (ZOFRAN) injection 4 mg (has no administration in time range)  heparin injection 5,000 Units (has no administration in time range)  senna-docusate (Senokot-S) tablet 1 tablet (has no administration in time range)  LORazepam (ATIVAN) injection 2 mg (has no administration in time range)  fenofibrate tablet 54 mg (has no administration in time range)  propranolol (INDERAL) tablet 10 mg (has no administration in time range)  cloZAPine (CLOZARIL) tablet 200 mg (has no administration in time range)  paliperidone (INVEGA) 24 hr tablet 6 mg (has no administration in time range)  sertraline (ZOLOFT) tablet 150 mg (has no administration in time range)  traZODone (DESYREL) tablet 50 mg (has no administration in time range)  pantoprazole (PROTONIX) EC tablet 40 mg (has no administration in time range)  Melatonin TABS 1 tablet (has no administration in time range)  lamoTRIgine (LAMICTAL) tablet 150 mg (has no administration in time range)  lacosamide (VIMPAT) tablet 200 mg (has no administration in time range)  Vitamin D3 TABS 50 mcg (has no administration in time range)  fluticasone (FLONASE) 50 MCG/ACT nasal spray 2 spray (has no administration in time range)  albuterol (PROVENTIL) (2.5 MG/3ML) 0.083% nebulizer solution 3 mL (has no administration in time range)  nicotine (NICODERM CQ -  dosed in mg/24 hours) patch 14 mg (has no administration in time range)  insulin aspart (novoLOG) injection 0-9 Units (has no administration in time range)  insulin aspart (novoLOG) injection 0-5 Units (has no administration in time range)  divalproex (DEPAKOTE) DR tablet 750 mg (has no administration in time range)  sodium chloride flush (NS) 0.9 % injection 3 mL (3 mLs Intravenous Given 05/15/22 1506)  levETIRAcetam (KEPPRA) IVPB 1000 mg/100 mL premix (0 mg Intravenous Stopped 05/15/22 1521)    Followed by  levETIRAcetam (KEPPRA) IVPB 1000 mg/100 mL premix (0 mg Intravenous Stopped 05/15/22 1536)  LORazepam (ATIVAN) injection 2 mg (2 mg Intravenous Given 05/15/22 1446)  LORazepam (ATIVAN) injection 1  mg (1 mg Intravenous Given 05/15/22 1500)  potassium chloride 10 mEq in 100 mL IVPB (0 mEq Intravenous Stopped 05/15/22 1951)  sodium chloride 0.9 % bolus 1,000 mL (1,000 mLs Intravenous New Bag/Given 05/15/22 1803)  dextrose 50 % solution 12.5 g (12.5 g Intravenous Given 05/15/22 1954)     IMPRESSION / MDM / ASSESSMENT AND PLAN / ED COURSE  I reviewed the triage vital signs and the nursing notes.                              Differential diagnosis includes, but is not limited to, status epilepticus, ICH, pseudoseizures, metabolic encephalopathy, substance abuse.  Patient's presentation is most consistent with acute presentation with potential threat to life or bodily function.  The patient is on the cardiac monitor to evaluate for evidence of arrhythmia and/or significant heart rate changes  37 yo M here with AMS. On arrival, pt GCS 6-7, but VSS. He was noted to have rhythmic eye movements to the right though not overt gaze deviation. Ativan 2mg  and 1 mg IV given with improvement. He had no gag reflex during episode. Suspect status epilepticus. Unclear trigger - will check screening labs. Dr. Rory Percy consulted and at bedside. Initially pt was minimally responsive and intubation was set up, but  seizures broke with ativan. Keppra IV loaded. Will likely admit for observation.   FINAL CLINICAL IMPRESSION(S) / ED DIAGNOSES   Final diagnoses:  Transient alteration of awareness     Rx / DC Orders   ED Discharge Orders     None        Note:  This document was prepared using Dragon voice recognition software and may include unintentional dictation errors.   Duffy Bruce, MD 05/15/22 2126

## 2022-05-15 NOTE — ED Notes (Signed)
Pt to CT with Miquel Dunn, RN

## 2022-05-15 NOTE — ED Triage Notes (Addendum)
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 Coming from [facility]. Room air initially in the 80's and pin point pupils. Initially placed on non rebreather by fire department. Given 2 mg IV narcan given. Pt still sleepy upon triage. Initially hypotensive with ems, but given fluids and has normalized per ems. Hx seizures, compliant with meds. No seizure activity witnessed. Witnesses state he just walked to the grass and sat down and laid back. Hx tbi.

## 2022-05-15 NOTE — Procedures (Signed)
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  EEG to evaluate for seizure  Level of alertness: Awake  AEDs during EEG study: None  Technical aspects: This EEG study was done with scalp electrodes positioned according to the 10-20 International system of electrode placement. Electrical activity was reviewed with band pass filter of 1-70Hz , sensitivity of 7 uV/mm, display speed of 43mm/sec with a 60Hz  notched filter applied as appropriate. EEG data were recorded continuously and digitally stored.  Video monitoring was available and reviewed as appropriate.  Description: The posterior dominant rhythm consists of 8 Hz activity of moderate voltage (25-35 uV) seen predominantly in posterior head regions, symmetric and reactive to eye opening and eye closing.  Hyperventilation and photic stimulation were not performed.     IMPRESSION: This study is within normal limits. No seizures or epileptiform discharges were seen throughout the recording.  A normal interictal EEG does not exclude the diagnosis of epilepsy.  [name] [name]

## 2022-05-15 NOTE — ED Notes (Signed)
^  A: seizure? AMS recurrent   Lucillie Garfinkel, MD 05/15/22 [phone]

## 2022-05-15 NOTE — ED Notes (Signed)
RN called legal guardian and left message with callback number
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
## 2022-05-15 NOTE — Assessment & Plan Note (Signed)
-   As needed nicotine patch ordered ?

## 2022-05-15 NOTE — Assessment & Plan Note (Signed)
-   Resumed home levothyroxine 25 mcg daily before breakfast

## 2022-05-15 NOTE — ED Notes (Signed)
Code stroke called

## 2022-05-15 NOTE — Assessment & Plan Note (Signed)
-   Resumed home paliperidone 6 every morning, sertraline 100 mg daily, trazodone 50 mg nightly, clozapine 200 mg daily - If continuous EEG is negative, I would recommend rounding provider to consult behavioral health for conversion disorder

## 2022-05-15 NOTE — Code Documentation (Signed)
Stroke Response Nurse Documentation Code Documentation  Eddie Berry is a 37 y.o. male arriving to [facility] via [facility] on 05/15/2022 with past medical hx of TBI and seizures. On No antithrombotic. Code stroke was activated by ED.   Patient brought in by EMS from [place] where [place] 13:45 after sudden onset AMS with questionable seizure like activity. ED provider noted absence of cough and gag with eyes open concerning for status epilepticus for which a code stroke was activated.   Stroke team at bedside after Code Stroke arrival.  Labs drawn and patient cleared for CT by Dr. Myrene Buddy. Patient to CT with team. Initial NIHSS 35, see documentation for details and code stroke times. Patient with decreased LOC, disoriented, not following commands, bilateral hemianopia, bilateral arm weakness, bilateral leg weakness, bilateral decreased sensation, Global aphasia , dysarthria , and Sensory  neglect on exam. Patient was given ativan at the bedside by nurse. Patient began to respond shortly after and follow up NIHSS 5 (see documentation for details) The following imaging was completed:  CT Head. Patient is not a candidate for IV Thrombolytic due to not a stroke, per MD. Patient is not a candidate for IR due to not a stroke, per MD. Code stroke cancelled.  Care Plan: Code stroke cancelled by MD, no further NIHSS assessments required.   Bedside handoff with ED RN Olivia Mackie.    Eddie Berry  Stroke Response RN

## 2022-05-15 NOTE — Progress Notes (Signed)
Eeg done 

## 2022-05-15 NOTE — ED Provider Notes (Signed)
I received signout on this patient, neurology consultation see their documentation earlier this patient with questionable seizure versus conversion disorder now back to baseline.  He had been at his baseline conversant alert no complaints throughout the majority of my shift when approximately 5:30 PM he was noted to get up out of bed to take out his IVs seemed confused but was very easily redirectable by nursing and asked to get back in his bed alert oriented no complaints.  He then became somnolent and unresponsive similar to documented state earlier in his emergency department stay, not responsive to noxious stimulus.  Maintaining airway, no obvious seizure activity, this state persisted for about 5 minutes when he started to regain consciousness and asked " am I in the hospital?"  Airway protected at this time, returning to baseline once again, defer intubation.  Continue to monitor.  Given another episode of this somnolence/questionable seizure will have him admitted to our hospitalist service.  EEG prior showed no seizure activity.   Lucillie Garfinkel, MD 05/15/22 [phone]

## 2022-05-15 NOTE — Assessment & Plan Note (Addendum)
-   Discussed case with neurology specialist who recommends admitting patient to the hospital overnight for continued monitoring - Neurology will continue to follow in the a.m. for reassessment and medication changes as needed - Repeat EEG in the morning - I have ordered UA with microscopy, TSH, magnesium level, B12 level, and portable stat chest x-ray to rule out infectious etiology versus other etiology that can be corrected - Neurology specialists will reevaluate patient and determine if patient requires EEG continuous monitoring - Resumed home seizure medication: Depakote 1000 mg p.o. twice daily, lacosamide 200 mg p.o. twice daily, lamotrigine 150 mg p.o. twice daily - Ativan 2 mg IV as needed for seizure, 2 doses ordered with instructions to administer as appropriate and then let provider know - Fall precautions, seizure precautions, aspiration precautions

## 2022-05-15 NOTE — ED Notes (Signed)
RN told by staff walking in hall that this pt was up out of bed with his pants off. RN and pod RN approached the room and pt pulling off cords, took out IV, out of bed at the end of the bed. When RN asked what he is doing out of bed, the pt replied, but RN unable to understand what he is saying. Pt still further trying to get out of bed. Both RN's trying to assist pt back into bed and pt started falling to the ground. RN's assisted him to the ground and he fell onto his left side o his hip/leg and arm/shoulder. Pt apologizing to RN's about falling. Able to understand and follow directions to get back into bed. RN changed sheets that had urine in them. Unknown when pt wet the bed. RN got pt into new gown and socks. Pt adjusted in bed.    RN told charge RN about fall. Rn informed provider about fall. RN informed provider, Wong, Md, that pt did not his head and was assisted to ground. MD to bedside. Pt not unresponsive like when he first came in during triage. Not opening eyes or responding to sternal rub/pain. Provider stated he was going to speak with neurology.    Provider back to room and stated we would monitor the pt and he would be admitted.    2 new IV's started on the pt. Started back on IV meds. Bed alarm set. Bed placed in lowest position again. Call light within reach. Taught to stay in bed.  

## 2022-05-15 NOTE — Hospital Course (Addendum)
Mr. Ugonna Keirsey is a 37 year old male with history of TBI, seizure, tobacco use, who presents emergency department for chief concerns of seizure.  Patient resides in a group home.  Initial vitals in the ED showed temperature of 98, respiration rate 20, heart rate 94, blood pressure 124/86, SpO2 97% on room air.  Serum sodium is 142, potassium 3.3, chloride 117, bicarb 19, BUN of 10, serum creatinine of 0.63, nonfasting blood glucose 64, WBC 5.9, hemoglobin 12.7, platelets of 291.  CT head of stroke without contrast: Was read as no acute intracranial abnormality.  Multiple areas of chronic encephalomalacia bilaterally consistent with traumatic brain injury.  CT cervical spine without contrast: Was read as no recent fracture is seen.  Cervical spondylosis with spinal stenosis and encroachment of neural foramina at multiple levels.  ED treatment: Ativan, Keppra 1000 mg x 2, sodium chloride 1 L bolus, potassium chloride 10 mill equivalent IV, 3 doses ordered.

## 2022-05-15 NOTE — ED Notes (Signed)
RN tried calling number for legal guardian. Tried calling the on call staff with no option to leave voicemail

## 2022-05-15 NOTE — Assessment & Plan Note (Signed)
-   Check TSH - hypoglycemia protocol order set utilized - Insulin SSI with HS coveraged ordered and CBG TID AC

## 2022-05-15 NOTE — ED Notes (Signed)
ED TO INPATIENT HANDOFF REPORT  ED Nurse Name and Phone #: [phone]  S Name/Age/Gender Eddie Berry 37 y.o. male Room/Bed: ED04A/ED04A  Code Status   Code Status: Full Code  Home/SNF/Other    grouphome Patient oriented to: self, place, and time Is this baseline?     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 Coming from [facility]. Room air initially in the 80's and pin point pupils. Initially placed on non rebreather by fire department. Given 2 mg IV narcan given. Pt still sleepy upon triage. Initially hypotensive with ems, but given fluids and has normalized per ems. Hx seizures, compliant with meds. No seizure activity witnessed. Witnesses state he just walked to the grass and sat down and laid back. Hx tbi.    Allergies Allergies  Allergen Reactions   Antihistamines, Diphenhydramine-Type Other (See Comments)    UNK reaction   Aspirin Other (See Comments)    UNK reaction   Nsaids Other (See Comments)    Unk reaction   Prednisone Other (See Comments)    UNK reaction   Penicillins Rash    Level of Care/Admitting Diagnosis ED Disposition     ED Disposition  Admit   Condition  --   Comment  Hospital Area: [facility] [[id number]]  Level of Care: Telemetry Medical [104]  Covid Evaluation: Confirmed COVID Negative  Diagnosis: Seizure (HCC) [205090]  Admitting Physician: Lovenia Kim [[id number]]  Attending Physician: COX, AMY N [id number]  Certification:: I certify this patient will need inpatient services for at least 2 midnights  Estimated Length of Stay: 3          B Medical/Surgery History Past Medical History:  Diagnosis Date   Seizures (HCC)    TBI (traumatic brain injury) [facility])    Past Surgical History:  Procedure Laterality Date   ANKLE SURGERY     APPENDECTOMY     BRAIN SURGERY     LUNG SURGERY       A IV  Location/Drains/Wounds Patient Lines/Drains/Airways Status     Active Line/Drains/Airways     Name Placement date Placement time Site Days   Peripheral IV 05/15/22 20 G Anterior;Right Forearm 05/15/22  1803  Forearm  less than 1   Peripheral IV 05/15/22 20 G Anterior;Left;Proximal Forearm 05/15/22  1806  Forearm  less than 1            Intake/Output Last 24 hours  Intake/Output Summary (Last 24 hours) at 05/15/2022 2004 Last data filed at 05/15/2022 1951 Gross per 24 hour  Intake 493.44 ml  Output --  Net 493.44 ml    Labs/Imaging Results for orders placed or performed during the hospital encounter of 05/15/22 (from the past 48 hour(s))  CBG monitoring, ED     Status: Abnormal   Collection Time: 05/15/22  2:49 PM  Result Value Ref Range   Glucose-Capillary 138 (H) 70 - 99 mg/dL    Comment: Glucose reference range applies only to samples taken after fasting for at least 8 hours.  CBC     Status: Abnormal   Collection Time: 05/15/22  3:35 PM  Result Value Ref Range   WBC 5.9 4.0 - 10.5 K/uL   RBC 4.12 (L) 4.22 - 5.81 MIL/uL   Hemoglobin 12.7 (L) 13.0 - 17.0 g/dL   HCT 69.6 29.5 - 28.4 %   MCV 100.2 (H) 80.0 - 100.0 fL  MCH 30.8 26.0 - 34.0 pg   MCHC 30.8 30.0 - 36.0 g/dL   RDW 13.3 11.5 - 15.5 %   Platelets 291 150 - 400 K/uL   nRBC 0.0 0.0 - 0.2 %    Comment: Performed at [facility], [address] [place], [place] [address]  Differential     Status: None   Collection Time: 05/15/22  3:35 PM  Result Value Ref Range   Neutrophils Relative % 42 %   Neutro Abs 2.5 1.7 - 7.7 K/uL   Lymphocytes Relative 43 %   Lymphs Abs 2.6 0.7 - 4.0 K/uL   Monocytes Relative 11 %   Monocytes Absolute 0.7 0.1 - 1.0 K/uL   Eosinophils Relative 2 %   Eosinophils Absolute 0.1 0.0 - 0.5 K/uL   Basophils Relative 1 %   Basophils Absolute 0.0 0.0 - 0.1 K/uL   Immature Granulocytes 1 %   Abs Immature Granulocytes 0.03 0.00 - 0.07 K/uL    Comment: Performed at [facility], [address] [place], [place] [address]  Comprehensive metabolic panel     Status: Abnormal   Collection Time: 05/15/22  3:35 PM  Result Value Ref Range   Sodium 142 135 - 145 mmol/L   Potassium 3.3 (L) 3.5 - 5.1 mmol/L   Chloride 117 (H) 98 - 111 mmol/L   CO2 19 (L) 22 - 32 mmol/L   Glucose, Bld 64 (L) 70 - 99 mg/dL    Comment: Glucose reference range applies only to samples taken after fasting for at least 8 hours.   BUN 10 6 - 20 mg/dL   Creatinine, Ser 0.63 0.61 - 1.24 mg/dL   Calcium 7.0 (L) 8.9 - 10.3 mg/dL   Total Protein 5.8 (L) 6.5 - 8.1 g/dL   Albumin 3.2 (L) 3.5 - 5.0 g/dL   AST 21 15 - 41 U/L   ALT 15 0 - 44 U/L   Alkaline Phosphatase 41 38 - 126 U/L   Total Bilirubin 0.4 0.3 - 1.2 mg/dL   GFR, Estimated >60 >60 mL/min    Comment: (NOTE) Calculated using the CKD-EPI Creatinine Equation (2021)    Anion gap 6 5 - 15    Comment: Performed at [facility], [address] [place], [place] [address]  Ethanol     Status: None   Collection Time: 05/15/22  3:35 PM  Result Value Ref Range   Alcohol, Ethyl (B) <10 <10 mg/dL    Comment: (NOTE) Lowest detectable limit for serum alcohol is 10 mg/dL.  For medical purposes only. Performed at [facility], [address] [place], [place] [address]   Valproic acid level     Status: None   Collection Time: 05/15/22  3:35 PM  Result Value Ref Range   Valproic Acid Lvl 52 50.0 - 100.0 ug/mL    Comment: Performed at [facility], [address] [place], [place] [address]  Resp panel by RT-PCR (RSV, Flu A&B, Covid) Anterior Nasal Swab     Status: None   Collection Time: 05/15/22  4:00 PM   Specimen: Anterior Nasal Swab  Result Value Ref Range   SARS Coronavirus 2 by RT PCR NEGATIVE NEGATIVE    Comment: (NOTE) SARS-CoV-2 target nucleic acids are NOT DETECTED.  The SARS-CoV-2 RNA is generally detectable in upper respiratory specimens during the acute phase of infection. The  lowest concentration of SARS-CoV-2 viral copies this assay can detect is 138 copies/mL. A negative result does not  preclude SARS-Cov-2 infection and should not be used as the sole basis for treatment or other patient management decisions. A negative result may occur with  improper specimen collection/handling, submission of specimen other than nasopharyngeal swab, presence of viral mutation(s) within the areas targeted by this assay, and inadequate number of viral copies(<138 copies/mL). A negative result must be combined with clinical observations, patient history, and epidemiological information. The expected result is Negative.  Fact Sheet for Patients:  EntrepreneurPulse.com.au  Fact Sheet for Healthcare Providers:  IncredibleEmployment.be  This test is no t yet approved or cleared by the [place] FDA and  has been authorized for detection and/or diagnosis of SARS-CoV-2 by FDA under an Emergency Use Authorization (EUA). This EUA will remain  in effect (meaning this test can be used) for the duration of the COVID-19 declaration under Section 564(b)(1) of the Act, 21 U.S.C.section 360bbb-3(b)(1), unless the authorization is terminated  or revoked sooner.       Influenza A by PCR NEGATIVE NEGATIVE   Influenza B by PCR NEGATIVE NEGATIVE    Comment: (NOTE) The Xpert Xpress SARS-CoV-2/FLU/RSV plus assay is intended as an aid in the diagnosis of influenza from Nasopharyngeal swab specimens and should not be used as a sole basis for treatment. Nasal washings and aspirates are unacceptable for Xpert Xpress SARS-CoV-2/FLU/RSV testing.  Fact Sheet for Patients: EntrepreneurPulse.com.au  Fact Sheet for Healthcare Providers: IncredibleEmployment.be  This test is not yet approved or cleared by the [place] FDA and has been authorized for detection and/or diagnosis of SARS-CoV-2 by FDA under an Emergency  Use Authorization (EUA). This EUA will remain in effect (meaning this test can be used) for the duration of the COVID-19 declaration under Section 564(b)(1) of the Act, 21 U.S.C. section 360bbb-3(b)(1), unless the authorization is terminated or revoked.     Resp Syncytial Virus by PCR NEGATIVE NEGATIVE    Comment: (NOTE) Fact Sheet for Patients: EntrepreneurPulse.com.au  Fact Sheet for Healthcare Providers: IncredibleEmployment.be  This test is not yet approved or cleared by the [place] FDA and has been authorized for detection and/or diagnosis of SARS-CoV-2 by FDA under an Emergency Use Authorization (EUA). This EUA will remain in effect (meaning this test can be used) for the duration of the COVID-19 declaration under Section 564(b)(1) of the Act, 21 U.S.C. section 360bbb-3(b)(1), unless the authorization is terminated or revoked.  Performed at [facility], [address] [place], [place] [address]   Protime-INR     Status: None   Collection Time: 05/15/22  4:21 PM  Result Value Ref Range   Prothrombin Time 13.0 11.4 - 15.2 seconds   INR 1.0 0.8 - 1.2    Comment: (NOTE) INR goal varies based on device and disease states. Performed at [facility], [address] [place], [place] [address]   APTT     Status: None   Collection Time: 05/15/22  4:21 PM  Result Value Ref Range   aPTT 28 24 - 36 seconds    Comment: Performed at [facility], [address] [place], [place] [address]  Magnesium     Status: None   Collection Time: 05/15/22  4:25 PM  Result Value Ref Range   Magnesium 2.0 1.7 - 2.4 mg/dL    Comment: Performed at [facility], [address] [place],  [address]  TSH     Status: None   Collection Time: 05/15/22  4:25 PM  Result Value Ref Range   TSH 1.602 0.350 -  4.500 uIU/mL    Comment: Performed by a 3rd Generation assay with a functional sensitivity of <=0.01  uIU/mL. Performed at [facility] [address] [place], [place] [address]   CBG monitoring, ED     Status: None   Collection Time: 05/15/22  7:19 PM  Result Value Ref Range   Glucose-Capillary 85 70 - 99 mg/dL    Comment: Glucose reference range applies only to samples taken after fasting for at least 8 hours.   DG Chest Port 1 View  Result Date: 05/15/2022 CLINICAL DATA:  Seizure EXAM: PORTABLE CHEST 1 VIEW COMPARISON:  Chest radiograph 02/03/2022 FINDINGS: The cardiomediastinal silhouette is normal. There is no focal consolidation or pulmonary edema. There is no pleural effusion or pneumothorax There is no acute osseous abnormality. IMPRESSION: No radiographic evidence of acute cardiopulmonary process. Electronically Signed   By: Valetta Mole M.D.   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  EEG to evaluate for seizure Level of alertness: Awake AEDs during EEG study: None Technical aspects: This EEG study was done with scalp electrodes positioned according to the 10-20 International system of electrode placement. Electrical activity was reviewed with band pass filter of 1-70Hz , sensitivity of 7 uV/mm, display speed of 12mm/sec with a 60Hz  notched filter applied as appropriate. EEG data were recorded continuously and digitally stored.  Video monitoring was available and reviewed as appropriate. Description: The posterior dominant rhythm consists of 8 Hz activity of moderate voltage (25-35 uV) seen predominantly in posterior head regions, symmetric and reactive to eye opening and eye  closing.  Hyperventilation and photic stimulation were not performed.   IMPRESSION: This study is within normal limits. No seizures or epileptiform discharges were seen throughout the recording. A normal interictal EEG does not exclude the diagnosis of epilepsy. Lora Havens   CT Cervical Spine Wo Contrast  Result Date: 05/15/2022 CLINICAL DATA:  Trauma EXAM: CT CERVICAL SPINE WITHOUT CONTRAST TECHNIQUE: Multidetector CT imaging of the cervical spine was performed without intravenous contrast. Multiplanar CT image reconstructions were also generated. RADIATION DOSE REDUCTION: This exam was performed according to the departmental dose-optimization program which includes automated exposure control, adjustment of the mA and/or kV according to patient size and/or use of iterative reconstruction technique. COMPARISON:  07/11/2021 FINDINGS: Alignment: Alignment of posterior margins of vertebral bodies is unremarkable. There is reversal of lordosis. Skull base and vertebrae: No recent fracture is seen. There is partial fusion of C2 and C3 vertebrae. Degenerative changes are noted with bony spurs at multiple levels. Soft tissues and spinal canal: There is extrinsic pressure over the ventral margin of thecal sac caused by posterior bony spurs, more so at C6-C7 and C7-T1 levels with spinal stenosis. Disc levels: There is encroachment of neural foramina at multiple levels, more so at C6-C7 and C7-T1 levels. Upper chest: Unremarkable. Other: None. IMPRESSION: No recent fracture is seen. Cervical spondylosis with spinal stenosis and encroachment of neural foramina at multiple levels as described in the body of the report. Electronically Signed   By: Elmer Picker M.D.   On: 05/15/2022 15:15   CT HEAD CODE STROKE WO  CONTRAST  Result Date: 05/15/2022 CLINICAL DATA:  Code stroke. Acute neuro deficit. History of traumatic brain injury. EXAM: CT HEAD WITHOUT CONTRAST TECHNIQUE: Contiguous axial images were obtained  from the base of the skull through the vertex without intravenous contrast. RADIATION DOSE REDUCTION: This exam was performed according to the departmental dose-optimization program which includes automated exposure control, adjustment of the mA and/or kV according to patient size and/or use of iterative reconstruction technique. COMPARISON:  CT head 02/10/2022 FINDINGS: Brain: Multiple areas of chronic encephalomalacia including the right frontal lobe, left frontal lobe, left temporal lobe. 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 Negative for acute infarct, hemorrhage, mass Vascular: Negative for hyperdense vessel Skull: Left frontal parietal craniotomy. Sinuses/Orbits: Paranasal sinuses clear. Metal prosthesis in the right eyelid causing artifact. Other: None ASPECTS ([place]ams Stroke Program Early CT Score) - Ganglionic level infarction (caudate, lentiform nuclei, internal capsule, insula, M1-M3 cortex): 7 - Supraganglionic infarction (M4-M6 cortex): 3 Total score (0-10 with 10 being normal): 10 IMPRESSION: 1. No acute intracranial abnormality. 2. Multiple areas of chronic encephalomalacia bilaterally consistent with traumatic brain injury. 3. Aspects is 10. 4. These results were called by telephone at the time of interpretation on 05/15/2022 at 3:04 pm to provider Duffy Bruce , who verbally acknowledged these results. Electronically Signed   By: Franchot Gallo M.D.   On: 05/15/2022 15:05    Pending Labs Unresulted Labs (From admission, onward)     Start     Ordered   05/16/22 [id number]  Basic metabolic panel  Tomorrow morning,   STAT        05/15/22 1839   05/16/22 0500  CBC  Tomorrow morning,   STAT        05/15/22 1839   05/16/22 0500  Hemoglobin A1c  Tomorrow morning,   R       Comments: To assess prior glycemic control    05/15/22 1921   05/15/22 1916  Urinalysis, Complete w Microscopic  Once,   R        05/15/22 1915   05/15/22 1843  Vitamin B12  Once,   R         05/15/22 1842   05/15/22 1842  Urine Drug Screen, Qualitative ([place] only)  Once,   R        05/15/22 1841   05/15/22 1509  Lacosamide  Once,   URGENT        05/15/22 1508   05/15/22 1509  Lamotrigine level  Once,   URGENT        05/15/22 1508            Vitals/Pain Today's Vitals   05/15/22 1730 05/15/22 1800 05/15/22 1809 05/15/22 1830  BP: 96/62 103/74 123/80 123/80  Pulse: (!) 107 87 86 86  Resp: (!) 21 (!) 23 (!) 23 (!) 23  Temp:  (!) 97.5 F (36.4 C) (!) 97.5 F (36.4 C)   TempSrc:      SpO2:  96% 94% 94%    Isolation Precautions No active isolations  Medications Medications  succinylcholine (ANECTINE) 200 MG/10ML syringe (  Not Given 05/15/22 1455)  etomidate (AMIDATE) 2 MG/ML injection (  Not Given 05/15/22 1456)  levothyroxine (SYNTHROID) tablet 25 mcg (has no administration in time range)  acetaminophen (TYLENOL) tablet 650 mg (has no administration in time range)    Or  acetaminophen (TYLENOL) suppository 650 mg (has no administration in time range)  ondansetron (ZOFRAN) tablet 4 mg (has no administration in time  range)    Or  ondansetron (ZOFRAN) injection 4 mg (has no administration in time range)  heparin injection 5,000 Units (has no administration in time range)  senna-docusate (Senokot-S) tablet 1 tablet (has no administration in time range)  LORazepam (ATIVAN) injection 2 mg (has no administration in time range)  fenofibrate tablet 54 mg (has no administration in time range)  propranolol (INDERAL) tablet 10 mg (has no administration in time range)  cloZAPine (CLOZARIL) tablet 200 mg (has no administration in time range)  paliperidone (INVEGA) 24 hr tablet 6 mg (has no administration in time range)  sertraline (ZOLOFT) tablet 100 mg (has no administration in time range)  traZODone (DESYREL) tablet 50 mg (has no administration in time range)  pantoprazole (PROTONIX) EC tablet 40 mg (has no administration in time range)  Melatonin TABS 1 tablet (has no  administration in time range)  lamoTRIgine (LAMICTAL) tablet 150 mg (has no administration in time range)  lacosamide (VIMPAT) tablet 200 mg (has no administration in time range)  divalproex (DEPAKOTE) DR tablet 1,000 mg (has no administration in time range)  Vitamin D3 TABS 50 mcg (has no administration in time range)  fluticasone (FLONASE) 50 MCG/ACT nasal spray 2 spray (has no administration in time range)  albuterol (PROVENTIL) (2.5 MG/3ML) 0.083% nebulizer solution 3 mL (has no administration in time range)  nicotine (NICODERM CQ - dosed in mg/24 hours) patch 14 mg (has no administration in time range)  insulin aspart (novoLOG) injection 0-9 Units (has no administration in time range)  insulin aspart (novoLOG) injection 0-5 Units (has no administration in time range)  sodium chloride flush (NS) 0.9 % injection 3 mL (3 mLs Intravenous Given 05/15/22 1506)  levETIRAcetam (KEPPRA) IVPB 1000 mg/100 mL premix (0 mg Intravenous Stopped 05/15/22 1521)    Followed by  levETIRAcetam (KEPPRA) IVPB 1000 mg/100 mL premix (0 mg Intravenous Stopped 05/15/22 1536)  LORazepam (ATIVAN) injection 2 mg (2 mg Intravenous Given 05/15/22 1446)  LORazepam (ATIVAN) injection 1 mg (1 mg Intravenous Given 05/15/22 1500)  potassium chloride 10 mEq in 100 mL IVPB (0 mEq Intravenous Stopped 05/15/22 1951)  sodium chloride 0.9 % bolus 1,000 mL (1,000 mLs Intravenous New Bag/Given 05/15/22 1803)  dextrose 50 % solution 12.5 g (12.5 g Intravenous Given 05/15/22 1954)    Mobility non-ambulatory High fall risk   Focused Assessments    R Recommendations: See Admitting Provider Note  Report given to:   Additional Notes: please call with any questions

## 2022-05-15 NOTE — Consult Note (Signed)
Neurology Consultation  Reason for Consult: Code stroke-altered mental status of sudden onset Referring Physician: Dr. Shaune Pollack  CC: Altered mental status  History is obtained from: Chart, patient  HPI: Eddie Berry is a 37 y.o. male past medical history of TBI, seizures, on lamotrigine, lacosamide and valproate, resident of group home, brought in for evaluation of sudden onset of altered mental status with concern for seizure activity.  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   Code stroke not activated in the field due to seizure as the presenting symptom.  Evaluated by the ED provider.  ED provider noted absence of cough and gag with eyes open concerning for status epilepticus for which a code stroke was activated. When I came to examine the patient, the ED team was getting ready to intubate him but wanted to wait for me to examine prior to intubation.  During my examination, he started to wake up because he had received 2 mg of Ativan prior and 1 mg while I was examining him.  He started to have conversations which were coherent and eventually decision was made not to intubate him. He started complaining of not getting along with people where he lives and wanted to stay in the hospital. He has been seen in the past in neurological consultation in the hospital by Dr. Otelia Limes last year for breakthrough seizures. His outpatient notes from Dr. Malvin Johns revealed that he has been having psychological issues and having trouble adjusting to his surroundings.  He is also had abnormal behaviors such as trying to throw himself out of cars.  In the emergency room, due to concern for status epilepticus, he was given a total of 3 mg of IV Ativan followed by a Keppra load of 2 g x 1.  Stat head CT with old TBI related changes-no  acute process.   LKW: 1:45 PM IV thrombolysis given?: no, likely breakthrough seizure versus conversion EVT: Examination not consistent with LVO Premorbid modified Rankin scale (mRS): 2  ROS: Full ROS was performed and is negative except as noted in the HPI.   Past Medical History:  Diagnosis Date   Seizures (HCC)    TBI (traumatic brain injury) (HCC)      No family history on file.   Social History:   reports that he quit smoking about 2 years ago. His smoking use included cigarettes. He has a 22.00 pack-year smoking history. He has never used smokeless tobacco. He reports that he does not currently use alcohol. He reports that he does not currently use drugs.  Medications  Current Facility-Administered Medications:    etomidate (AMIDATE) 2 MG/ML injection, , , ,    levETIRAcetam (KEPPRA) IVPB 1000 mg/100 mL premix, 1,000 mg, Intravenous, Once, Last Rate: 400 mL/hr at 05/15/22 1506, 1,000 mg at 05/15/22 1506 **FOLLOWED BY** levETIRAcetam (KEPPRA) IVPB 1000 mg/100 mL premix, 1,000 mg, Intravenous, Once, Shaune Pollack, MD   LORazepam (ATIVAN) 2 MG/ML injection, , , ,    LORazepam (ATIVAN) 2 MG/ML injection, , , ,    LORazepam (ATIVAN) injection 1 mg, 1 mg, Intravenous, Once, Dolan, Carissa E, RPH   LORazepam (ATIVAN) injection 2 mg, 2 mg, Intravenous, Once, Sharen Hones, RPH   succinylcholine (ANECTINE) 200 MG/10ML syringe, , , ,   Current Outpatient Medications:    acetaminophen (TYLENOL) 500 MG tablet, Take 500 mg  by mouth every 6 (six) hours as needed., Disp: , Rfl:    albuterol (VENTOLIN HFA) 108 (90 Base) MCG/ACT inhaler, Inhale 2 puffs into the lungs every 6 (six) hours as needed for wheezing or shortness of breath., Disp: , Rfl:    azelastine (ASTELIN) 0.1 % nasal spray, Place 2 sprays into both nostrils as needed for rhinitis. Use in each nostril as directed, Disp: , Rfl:    cetirizine (ZYRTEC) 10 MG tablet, Take 10 mg by mouth daily., Disp: , Rfl:     Cholecalciferol (VITAMIN D3) 50 MCG (2000 UT) TABS, Take 1 tablet by mouth daily., Disp: , Rfl:    clozapine (CLOZARIL) 200 MG tablet, Take 200 mg by mouth daily., Disp: , Rfl:    cloZAPine (CLOZARIL) 25 MG tablet, Take 75 mg by mouth daily., Disp: , Rfl:    divalproex (DEPAKOTE) 250 MG DR tablet, Take 250 mg by mouth 2 (two) times daily., Disp: , Rfl:    divalproex (DEPAKOTE) 500 MG DR tablet, Take 500 mg by mouth 2 (two) times daily., Disp: , Rfl:    docusate sodium (COLACE) 100 MG capsule, Take 100 mg by mouth 2 (two) times daily., Disp: , Rfl:    fenofibrate (TRICOR) 48 MG tablet, Take 48 mg by mouth daily., Disp: , Rfl:    fluticasone (FLONASE) 50 MCG/ACT nasal spray, Place 2 sprays into both nostrils daily., Disp: , Rfl:    hydroxypropyl methylcellulose / hypromellose (ISOPTO TEARS / GONIOVISC) 2.5 % ophthalmic solution, Place 2 drops into the right eye 4 (four) times daily., Disp: , Rfl:    lacosamide (VIMPAT) 200 MG TABS tablet, Take 1 tablet (200 mg total) by mouth 2 (two) times daily., Disp: 60 tablet, Rfl: 2   lamoTRIgine (LAMICTAL) 100 MG tablet, Take 50 mg by mouth 2 (two) times daily., Disp: , Rfl:    lamoTRIgine (LAMICTAL) 150 MG tablet, Take 150 mg by mouth daily., Disp: , Rfl:    levothyroxine (SYNTHROID) 25 MCG tablet, Take 25 mcg by mouth daily before breakfast., Disp: , Rfl:    Melatonin 10 MG TABS, Take 1 tablet by mouth at bedtime., Disp: , Rfl:    mirabegron ER (MYRBETRIQ) 50 MG TB24 tablet, Take 50 mg by mouth daily., Disp: , Rfl:    Multiple Vitamin (THEREMS PO), Take 1 tablet by mouth daily., Disp: , Rfl:    omeprazole (PRILOSEC) 20 MG capsule, Take 20 mg by mouth daily., Disp: , Rfl:    paliperidone (INVEGA) 6 MG 24 hr tablet, Take 6 mg by mouth daily., Disp: , Rfl:    propranolol (INDERAL) 10 MG tablet, Take 10 mg by mouth daily., Disp: , Rfl:    sertraline (ZOLOFT) 100 MG tablet, Take 100 mg by mouth daily., Disp: , Rfl:    sertraline (ZOLOFT) 50 MG tablet, Take 50 mg  by mouth at bedtime., Disp: , Rfl:    traZODone (DESYREL) 50 MG tablet, Take 50 mg by mouth at bedtime., Disp: , Rfl:    Exam: Current vital signs: BP 124/86   Pulse 94   Temp 98 F (36.7 C) (Oral)   Resp 20   SpO2 97%  Vital signs in last 24 hours: Temp:  [98 F (36.7 C)] 98 F (36.7 C) (01/10 1431) Pulse Rate:  [94] 94 (01/10 1431) Resp:  [20] 20 (01/10 1431) BP: (124)/(86) 124/86 (01/10 1431) SpO2:  [97 %] 97 % (01/10 1431) General: Initially completely unresponsive but during the exam started becoming more responsive.  In no  acute distress HEENT: Normocephalic, scars from prior surgeries CVS: Regular rate rhythm Abdomen nondistended nontender Respiratory: Breathing well and saturating normally on room air Extremities warm well-perfused Neurological exam Initially completely unresponsive to sound or noxious stimulation-no response to sternal rub. While trying to do further examination, started to come around and started becoming verbal. Upon becoming verbal, was able to follow commands. Speech is mildly dysarthric and initially was moderately dysarthric. He is awake alert oriented x 3 within a few minutes Cranial nerves: Pupils appear equal round reactive to light, extraocular movements are unhindered but he does have nystagmus at rest as well as on the right and leftward gaze.  Blinks to threat from both sides and is able to count fingers bilaterally equally but reports poor visual acuity in both eyes at baseline.  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  Facial sensation is intact Motor examination with no drift in any of the 4 extremities-was able to raise all 4 antigravity Sensation intact light touch Coordination exam with no dysmetria NIHSS-initial 35 due to complete unresponsiveness but he came around very quickly and the NIH stroke scale was 5.  Labs I have reviewed labs in epic and the results pertinent to this consultation are: Labs are pending at this  time Valproate, lacosamide, lamotrigine levels have also been ordered  Imaging I have reviewed the images obtained:  CT-head-no acute changes.  Multiple areas of chronic encephalomalacia bilaterally consistent with prior traumatic brain injury.  Aspects 10.  No evidence of bleed. 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  Resident of a group home.  Initially completely unresponsiveness to the point that he did not have a cough or gag and had concern for status epilepticus (nonconvulsive).  As I started to examine him and he received 3 mg of Ativan, he started to come around and was able to have conversations. Plans of intubation were discontinued due to him now being able to come around and protect his airway. He states that he wants to stay in the hospital and does not want to go back because [facility], he is not getting along well with people there.  He verbalized to the RN that he does not feel safe at his place of residence-please see RN progress note in the chart.  At this time, he likely had a breakthrough seizure or this was a conversion disorder-it is difficult to say. Given his history of TBI and structural abnormalities on brain imaging, I will given the benefit of the doubt and treat this as a breakthrough seizure-given one-time dose of Keppra 2 g IV.  His neurological exam now is back to baseline-I do not think he needs any further inpatient neurological workup but may need social work Engineer, structural to ED  Impression: Breakthrough seizure versus conversion disorder  Recommendations: Check lamotrigine, lacosamide and valproate levels before resuming home medications Stat EEG to rule out underlying seizures Resume home antiepileptic medications at current doses.  Aim of checking the levels to ensure compliance so that when he  follows with his outpatient provider, they know if he was compliant or not Seizure precautions Check urinary toxicology screen Social work/case management referral-defer to the ER.  He has verbalized concern for safety at his place of residence in the group home.  Outpatient follow-up with Dr. Melrose Nakayama in 4 to 6 weeks.   -- Amie Portland, MD Neurologist  [facility] Pager: [phone]

## 2022-05-16 DIAGNOSIS — F39 Unspecified mood [affective] disorder: Secondary | ICD-10-CM | POA: Diagnosis not present

## 2022-05-16 DIAGNOSIS — E162 Hypoglycemia, unspecified: Secondary | ICD-10-CM

## 2022-05-16 DIAGNOSIS — R569 Unspecified convulsions: Secondary | ICD-10-CM | POA: Diagnosis not present

## 2022-05-16 LAB — GLUCOSE, CAPILLARY
Glucose-Capillary: 87 mg/dL (ref 70–99)
Glucose-Capillary: 160 mg/dL — ABNORMAL HIGH (ref 70–99)

## 2022-05-16 LAB — CBC
HCT: 40.2 % (ref 39.0–52.0)
Hemoglobin: 12.9 g/dL — ABNORMAL LOW (ref 13.0–17.0)
MCH: 30.4 pg (ref 26.0–34.0)
MCHC: 32.1 g/dL (ref 30.0–36.0)
MCV: 94.6 fL (ref 80.0–100.0)
Platelets: 314 10*3/uL (ref 150–400)
RBC: 4.25 MIL/uL (ref 4.22–5.81)
RDW: 13.2 % (ref 11.5–15.5)
WBC: 9.2 10*3/uL (ref 4.0–10.5)
nRBC: 0 % (ref 0.0–0.2)

## 2022-05-16 LAB — BASIC METABOLIC PANEL
Anion gap: 7 (ref 5–15)
BUN: 12 mg/dL (ref 6–20)
CO2: 24 mmol/L (ref 22–32)
Calcium: 8.5 mg/dL — ABNORMAL LOW (ref 8.9–10.3)
Chloride: 107 mmol/L (ref 98–111)
Creatinine, Ser: 0.77 mg/dL (ref 0.61–1.24)
GFR, Estimated: >60 mL/min (ref >60)
Glucose, Bld: 83 mg/dL (ref 70–99)
Potassium: 3.9 mmol/L (ref 3.5–5.1)
Sodium: 138 mmol/L (ref 135–145)

## 2022-05-16 LAB — HEMOGLOBIN A1C
Hgb A1c MFr Bld: 5.6 % (ref 4.8–5.6)
Mean Plasma Glucose: 114.02 mg/dL

## 2022-05-16 LAB — VITAMIN B12: Vitamin B-12: 442 pg/mL (ref 180–914)

## 2022-05-16 LAB — LAMOTRIGINE LEVEL: Lamotrigine Lvl: 7.5 ug/mL (ref 2.0–20.0)

## 2022-05-16 NOTE — TOC Transition Note (Signed)
Transition of Care [facility]) - CM/SW Discharge Note   Patient Details  Name: Eddie Berry MRN: [id number] Date of Birth: [birth year]  [facility] CM/SW Contact:  Quin Hoop, LCSW Phone Number: 05/16/2022, 1:22 PM   Clinical Narrative:    Code 44 delivered to patient.  CSW notified Elonda Husky at [facility] that patient is ready for discharge.  D/C summary is completed.  Elonda Husky to pick pt up at 2pm today.   Final next level of care: Group Home ([place] [facility]) Barriers to Discharge: No Barriers Identified   Patient Goals and CMS Choice      Discharge Placement                    Name of family member notified: [place] notified Elonda Husky, [phone])    Discharge Plan and Services Additional resources added to the After Visit Summary for                                       Social Determinants of Health ([place]) Interventions [place]: No Food Insecurity (02/03/2022)  Housing: Low Risk  (02/03/2022)  Transportation Needs: No Transportation Needs (02/03/2022)  Utilities: Not At Risk (02/03/2022)  Tobacco Use: Medium Risk (02/28/2022)     Readmission Risk Interventions     No data to display

## 2022-05-16 NOTE — Care Management Obs Status (Signed)
[place] NOTIFICATION   Patient Details  Name: Eddie Berry MRN: [id number] Date of Birth: [birth year]   Medicare Observation Status Notification Given:  Yes    Judeen Hammans Devyn Sheerin, LCSW 05/16/2022, 12:59 PM

## 2022-05-16 NOTE — Progress Notes (Signed)
Neurology progress note  Subjective:  Patient seen and examined this morning He was admitted to the hospital yesterday because he had another episode concerning for seizure as he was getting ready for discharge yesterday.  No seizures after that Lying in bed comfortably resting  Medications  Current Facility-Administered Medications:    acetaminophen (TYLENOL) tablet 650 mg, 650 mg, Oral, Q6H PRN **OR** acetaminophen (TYLENOL) suppository 650 mg, 650 mg, Rectal, Q6H PRN, Cox, Amy N, DO   albuterol (PROVENTIL) (2.5 MG/3ML) 0.083% nebulizer solution 3 mL, 3 mL, Nebulization, Q6H PRN, Cox, Amy N, DO   cholecalciferol (VITAMIN D3) 25 MCG (1000 UNIT) tablet 2,000 Units, 2,000 Units, Oral, Daily, Cox, Amy N, DO   cloZAPine (CLOZARIL) tablet 200 mg, 200 mg, Oral, Daily, Cox, Amy N, DO   divalproex (DEPAKOTE) DR tablet 750 mg, 750 mg, Oral, Q12H, Foust, Katy L, NP   fenofibrate tablet 54 mg, 54 mg, Oral, Daily, Cox, Amy N, DO   fluticasone (FLONASE) 50 MCG/ACT nasal spray 2 spray, 2 spray, Each Nare, Daily PRN, Cox, Amy N, DO   heparin injection 5,000 Units, 5,000 Units, Subcutaneous, Q8H, Cox, Amy N, DO, 5,000 Units at 05/16/22 0649   insulin aspart (novoLOG) injection 0-5 Units, 0-5 Units, Subcutaneous, QHS, Cox, Amy N, DO   insulin aspart (novoLOG) injection 0-9 Units, 0-9 Units, Subcutaneous, TID WC, Cox, Amy N, DO   lacosamide (VIMPAT) tablet 200 mg, 200 mg, Oral, BID, Foust, Katy L, NP   lamoTRIgine (LAMICTAL) tablet 150 mg, 150 mg, Oral, BID, Cox, Amy N, DO   levothyroxine (SYNTHROID) tablet 25 mcg, 25 mcg, Oral, QAC breakfast, Cox, Amy N, DO   LORazepam (ATIVAN) injection 2 mg, 2 mg, Intravenous, PRN, Cox, Amy N, DO   melatonin tablet 10 mg, 10 mg, Oral, QHS, Cox, Amy N, DO   nicotine (NICODERM CQ - dosed in mg/24 hours) patch 14 mg, 14 mg, Transdermal, Daily PRN, Cox, Amy N, DO   ondansetron (ZOFRAN) tablet 4 mg, 4 mg, Oral, Q6H PRN **OR** ondansetron (ZOFRAN) injection 4 mg, 4 mg,  Intravenous, Q6H PRN, Cox, Amy N, DO   paliperidone (INVEGA) 24 hr tablet 6 mg, 6 mg, Oral, q morning, Cox, Amy N, DO   pantoprazole (PROTONIX) EC tablet 40 mg, 40 mg, Oral, Daily, Cox, Amy N, DO   propranolol (INDERAL) tablet 10 mg, 10 mg, Oral, Daily, Cox, Amy N, DO   senna-docusate (Senokot-S) tablet 1 tablet, 1 tablet, Oral, QHS PRN, Cox, Amy N, DO   sertraline (ZOLOFT) tablet 150 mg, 150 mg, Oral, QHS, Cox, Amy N, DO   traZODone (DESYREL) tablet 50 mg, 50 mg, Oral, QHS, Cox, Amy N, DO   Exam: Current vital signs: BP 108/70 (BP Location: Right Arm)   Pulse 98   Temp 98.1 F (36.7 C) (Oral)   Resp 18   SpO2 96%  Vital signs in last 24 hours: Temp:  [97.5 F (36.4 C)-98.7 F (37.1 C)] 98.1 F (36.7 C) (01/11 4196) Pulse Rate:  [81-107] 98 (01/11 0833) Resp:  [18-23] 18 (01/11 0833) BP: (88-134)/(62-87) 108/70 (01/11 0833) SpO2:  [94 %-100 %] 96 % (01/11 0833) General: Awake alert in no distress HEENT: Normocephalic, scars of prior surgery CVS: Regular rhythm Abdomen nondistended nontender Respiratory: Breathing well saturating normally on room air Extremities warm well-perfused Neurological exam Awake alert oriented x 3 Mild dysarthria No aphasia Cranial nerves: Pupils equal round react light, extraocular movements unhindered but has nystagmus at rest as well as in gazes, blinks to threat  from both sides and is able to count fingers bilaterally equally but reports subjective poor visual acuity at baseline.  Right full face paralysis.  Facial sensation intact. Motor: No weakness in any of the 4 extremities Sensation intact to light touch Coordination with no dysmetria   Labs I have reviewed labs in epic and the results pertinent to this consultation are: Valproate level: 52 BMP reviewed-no acute abnormalities, CBC with mild anemia 12.9 hemoglobin. U tox negative  Imaging I have reviewed the images obtained:  CT-head-no acute changes.  Multiple areas of chronic  encephalomalacia bilaterally consistent with prior traumatic brain injury.  Aspects 10.  No evidence of bleed.   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  Resident of a group home.  Initially completely unresponsiveness to the point that he did not have a cough or gag and had concern for status epilepticus (nonconvulsive).  As I started to examine him and he received 3 mg of Ativan, he started to come around and was able to have conversations.  Received one-time load of Keppra 2 g IV. Plans of intubation were discontinued due to him now being able to come around and protect his airway. He stated that he wants to stay in the hospital and does not want to go back because [facility], he is not getting along well with people there.  He verbalized to the RN that he does not feel safe at his place of residence-please see RN progress note in the chart. I had yesterday recommended discharge as deemed appropriate from the ER but he had another episode concerning for seizure and was admitted. My plan was to repeat an EEG if he is not coming around but he is completely back to baseline-no need for further EEG or any other workup at this time.  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  Lamotrigine and lacosamide levels pending.   I had ordered another EEG for today after he had a seizure episode but he is back to baseline and I do not think that another EEG is going out much value to his treatment.  I will cancel that EEG. Resume home antiepileptic medications at current doses.  Aim of checking the levels to ensure compliance so that when he follows with his outpatient provider, they know if he was compliant or not Seizure precautions Social work/case management referral-defer to the ER.  He has verbalized concern for  safety at his place of residence in the group home.  Outpatient follow-up with Dr. Melrose Nakayama in 4 to 6 weeks.  Plan relayed to Dr. Roosevelt Locks.   -- Amie Portland, MD Neurologist [facility]

## 2022-05-16 NOTE — Plan of Care (Signed)

## 2022-05-16 NOTE — Progress Notes (Signed)
Patients Guardian Normand Sloop contacted about patients discharge. All questions were answered. Will contact group home for pick up

## 2022-05-16 NOTE — Discharge Summary (Signed)
Physician Discharge Summary   Patient: Eddie Berry MRN: [id number] DOB: 27-Apr-1986  Admit date:     05/15/2022  Discharge date: 05/16/22  Discharge Physician: Sharen Hones   PCP: Lauretta Grill, NP   Recommendations at discharge:   Follow-up with PCP in 1 week. Follow-up with the results of lamotrigine, Vimpat serum levels to determine compliance.  Discharge Diagnoses: Principal Problem:   Seizure [facility]) Active Problems:   Mood disorder ([place])   TBI (traumatic brain injury) ([place])   Excessive daytime sleepiness   Hypothyroidism   Tobacco use   Hypoglycemia  Resolved Problems:   * No resolved hospital problems. * Hypokalemia. Metabolic acidosis.  Hospital Course: Mr. Eddie Berry is a 37 year old male with history of TBI, seizure, tobacco use, who presents emergency department for chief concerns of seizure.  Patient resides in a group home.  Initial vitals in the ED showed temperature of 98, respiration rate 20, heart rate 94, blood pressure 124/86, SpO2 97% on room air.  Serum sodium is 142, potassium 3.3, chloride 117, bicarb 19, BUN of 10, serum creatinine of 0.63, nonfasting blood glucose 64, WBC 5.9, hemoglobin 12.7, platelets of 291.  CT head of stroke without contrast: Was read as no acute intracranial abnormality.  Multiple areas of chronic encephalomalacia bilaterally consistent with traumatic brain injury.  CT cervical spine without contrast: Was read as no recent fracture is seen.  Cervical spondylosis with spinal stenosis and encroachment of neural foramina at multiple levels.  ED treatment: Ativan, Keppra 1000 mg x 2, sodium chloride 1 L bolus, potassium chloride 10 mill equivalent IV, 3 doses ordered.  Assessment and Plan: * Seizure [facility]) Patient has been seen by neurology, initial EEG did not see any seizure activity.  No infectious source was identified.  Patient had a mild hypoglycemia, active due to not eating.  Resolved this morning.  He is not on any  diabetic medicines. Discussed with neurology, patient can go back to the original regimen.  Neurology has cleared patient for discharge.  However, he has checked serum level of lamotrigine and Vimpat, results are pending, patient be followed with PCP for these results to determine if patient is compliant with treatment. Serum level of Keppra was within therapeutic range.  Mood disorder ([place]) Resume home treatment.  Hypoglycemia Likely due to n.p.o. status, condition improved.  Tobacco use Advised to quit.  Hypothyroidism Synthroid        Consultants: Neurology Procedures performed: None  Disposition: Group home Diet recommendation:  Discharge Diet Orders (From admission, onward)     Start     Ordered   05/16/22 0000  Diet - low sodium heart healthy        05/16/22 1209           Cardiac diet DISCHARGE MEDICATION: Allergies as of 05/16/2022       Reactions   Antihistamines, Diphenhydramine-type Other (See Comments)   UNK reaction   Aspirin Other (See Comments)   UNK reaction   Nsaids Other (See Comments)   Unk reaction   Prednisone Other (See Comments)   UNK reaction   Penicillins Rash        Medication List     STOP taking these medications    lamoTRIgine 100 MG tablet Commonly known as: LAMICTAL   lamoTRIgine 150 MG tablet Commonly known as: LAMICTAL       TAKE these medications    acetaminophen 500 MG tablet Commonly known as: TYLENOL Take 500 mg by mouth every 6 (six) hours as  needed.   albuterol 108 (90 Base) MCG/ACT inhaler Commonly known as: VENTOLIN HFA Inhale 2 puffs into the lungs every 6 (six) hours as needed for wheezing or shortness of breath.   azelastine 0.1 % nasal spray Commonly known as: ASTELIN Place 2 sprays into both nostrils as needed for rhinitis. Use in each nostril as directed   cetirizine 10 MG tablet Commonly known as: ZYRTEC Take 10 mg by mouth daily.   clozapine 200 MG tablet Commonly known as:  CLOZARIL Take 200 mg by mouth daily. What changed: Another medication with the same name was removed. Continue taking this medication, and follow the directions you see here.   divalproex 500 MG DR tablet Commonly known as: DEPAKOTE Take 500 mg by mouth 2 (two) times daily. What changed: Another medication with the same name was removed. Continue taking this medication, and follow the directions you see here.   docusate sodium 100 MG capsule Commonly known as: COLACE Take 100 mg by mouth 2 (two) times daily.   fenofibrate 48 MG tablet Commonly known as: TRICOR Take 48 mg by mouth daily.   fluticasone 50 MCG/ACT nasal spray Commonly known as: FLONASE Place 2 sprays into both nostrils daily.   hydroxypropyl methylcellulose / hypromellose 2.5 % ophthalmic solution Commonly known as: ISOPTO TEARS / GONIOVISC Place 2 drops into the right eye 4 (four) times daily.   lacosamide 200 MG Tabs tablet Commonly known as: VIMPAT Take 1 tablet (200 mg total) by mouth 2 (two) times daily.   levothyroxine 25 MCG tablet Commonly known as: SYNTHROID Take 25 mcg by mouth daily before breakfast.   Melatonin 10 MG Tabs Take 1 tablet by mouth at bedtime.   Myrbetriq 50 MG Tb24 tablet Generic drug: mirabegron ER Take 50 mg by mouth daily.   omeprazole 20 MG capsule Commonly known as: PRILOSEC Take 20 mg by mouth daily.   paliperidone 6 MG 24 hr tablet Commonly known as: INVEGA Take 6 mg by mouth daily.   propranolol 10 MG tablet Commonly known as: INDERAL Take 10 mg by mouth daily.   sertraline 50 MG tablet Commonly known as: ZOLOFT Take 50 mg by mouth at bedtime.   sertraline 100 MG tablet Commonly known as: ZOLOFT Take 100 mg by mouth daily.   THEREMS PO Take 1 tablet by mouth daily.   traZODone 50 MG tablet Commonly known as: DESYREL Take 50 mg by mouth at bedtime.   Vitamin D3 50 MCG (2000 UT) Tabs Take 1 tablet by mouth daily.        Follow-up Information      Hatchett, Mary, NP Follow up in 1 week(s).   Specialty: Nurse Practitioner Contact information: [address] [place] [address] [phone]                Discharge Exam: There were no vitals filed for this visit. General exam: Appears calm and comfortable  Respiratory system: Clear to auscultation. Respiratory effort normal. Cardiovascular system: S1 & S2 heard, RRR. No JVD, murmurs, rubs, gallops or clicks. No pedal edema. Gastrointestinal system: Abdomen is nondistended, soft and nontender. No organomegaly or masses felt. Normal bowel sounds heard. Central nervous system: Alert and oriented x3.  No focal neurological deficits. Extremities: Symmetric 5 x 5 power. Skin: No rashes, lesions or ulcers Psychiatry: Judgement and insight appear normal. Mood & affect appropriate.    Condition at discharge: good  The results of significant diagnostics from this hospitalization (including imaging, microbiology, ancillary and laboratory) are listed  below for reference.   Imaging Studies: DG Chest Port 1 View  Result Date: 05/15/2022 CLINICAL DATA:  Seizure EXAM: PORTABLE CHEST 1 VIEW COMPARISON:  Chest radiograph 02/03/2022 FINDINGS: The cardiomediastinal silhouette is normal. There is no focal consolidation or pulmonary edema. There is no pleural effusion or pneumothorax There is no acute osseous abnormality. IMPRESSION: No radiographic evidence of acute cardiopulmonary process. Electronically Signed   By: Lesia Hausen M.D.   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  EEG to evaluate  for seizure Level of alertness: Awake AEDs during EEG study: None Technical aspects: This EEG study was done with scalp electrodes positioned according to the 10-20 International system of electrode placement. Electrical activity was reviewed with band pass filter of 1-70Hz , sensitivity of 7 uV/mm, display speed of 20mm/sec with a 60Hz  notched filter applied as appropriate. EEG data were recorded continuously and digitally stored.  Video monitoring was available and reviewed as appropriate. Description: The posterior dominant rhythm consists of 8 Hz activity of moderate voltage (25-35 uV) seen predominantly in posterior head regions, symmetric and reactive to eye opening and eye closing.  Hyperventilation and photic stimulation were not performed.   IMPRESSION: This study is within normal limits. No seizures or epileptiform discharges were seen throughout the recording. A normal interictal EEG does not exclude the diagnosis of epilepsy.   CT Cervical Spine Wo Contrast  Result Date: 05/15/2022 CLINICAL DATA:  Trauma EXAM: CT CERVICAL SPINE WITHOUT CONTRAST TECHNIQUE: Multidetector CT imaging of the cervical spine was performed without intravenous contrast. Multiplanar CT image reconstructions were also generated. RADIATION DOSE REDUCTION: This exam was performed according to the departmental dose-optimization program which includes automated exposure control, adjustment of the mA and/or kV according to patient size and/or use of iterative reconstruction technique. COMPARISON:  07/11/2021 FINDINGS: Alignment: Alignment of posterior margins of vertebral bodies is unremarkable. There is reversal of lordosis. Skull base and vertebrae: No recent fracture is seen. There is partial fusion of C2 and C3 vertebrae. Degenerative changes are noted with bony spurs at multiple levels. Soft tissues and spinal canal: There is extrinsic pressure over the ventral margin of thecal sac caused by posterior bony  spurs, more so at C6-C7 and C7-T1 levels with spinal stenosis. Disc levels: There is encroachment of neural foramina at multiple levels, more so at C6-C7 and C7-T1 levels. Upper chest: Unremarkable. Other: None. IMPRESSION: No recent fracture is seen. Cervical spondylosis with spinal stenosis and encroachment of neural foramina at multiple levels as described in the body of the report. Electronically Signed   By: 09/10/2021 M.D.   On: 05/15/2022 15:15   CT HEAD CODE STROKE WO CONTRAST  Result Date: 05/15/2022 CLINICAL DATA:  Code stroke. Acute neuro deficit. History of traumatic brain injury. EXAM: CT HEAD WITHOUT CONTRAST TECHNIQUE: Contiguous axial images were obtained from the base of the skull through the vertex without intravenous contrast. RADIATION DOSE REDUCTION: This exam was performed according to the departmental dose-optimization program which includes automated exposure control, adjustment of the mA and/or kV according to patient size and/or use of iterative reconstruction technique. COMPARISON:  CT head 02/10/2022 FINDINGS: Brain: Multiple areas of chronic encephalomalacia including the right frontal lobe, left frontal lobe, left temporal lobe. 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 Negative for acute infarct, hemorrhage, mass Vascular: Negative for hyperdense vessel Skull: Left frontal parietal craniotomy. Sinuses/Orbits: Paranasal sinuses clear. Metal prosthesis in the right eyelid causing artifact. Other: None ASPECTS (Alberta Stroke Program Early CT Score) - Ganglionic level infarction (caudate, lentiform nuclei, internal capsule, insula, M1-M3 cortex): 7 - Supraganglionic infarction (M4-M6 cortex): 3 Total score (0-10 with 10 being normal): 10 IMPRESSION: 1. No acute intracranial abnormality. 2. Multiple areas of chronic encephalomalacia bilaterally consistent with traumatic brain injury. 3. Aspects is 10. 4. These results were called by telephone at  the time of interpretation on 05/15/2022 at 3:04 pm to provider Shaune Pollack , who verbally acknowledged these results. Electronically Signed   By: Marlan Palau M.D.   On: 05/15/2022 15:05    Microbiology: Results for orders placed or performed during the hospital encounter of 05/15/22  Resp panel by RT-PCR (RSV, Flu A&B, Covid) Anterior Nasal Swab     Status: None   Collection Time: 05/15/22  4:00 PM   Specimen: Anterior Nasal Swab  Result Value Ref Range Status   SARS Coronavirus 2 by RT PCR NEGATIVE NEGATIVE Final    Comment: (NOTE) SARS-CoV-2 target nucleic acids are NOT DETECTED.  The SARS-CoV-2 RNA is generally detectable in upper respiratory specimens during the acute phase of infection. The lowest concentration of SARS-CoV-2 viral copies this assay can detect is 138 copies/mL. A negative result does not preclude SARS-Cov-2 infection and should not be used as the sole basis for treatment or other patient management decisions. A negative result may occur with  improper specimen collection/handling, submission of specimen other than nasopharyngeal swab, presence of viral mutation(s) within the areas targeted by this assay, and inadequate number of viral copies(<138 copies/mL). A negative result must be combined with clinical observations, patient history, and epidemiological information. The expected result is Negative.  Fact Sheet for Patients:  BloggerCourse.com  Fact Sheet for Healthcare Providers:  SeriousBroker.it  This test is no t yet approved or cleared by the [place] FDA and  has been authorized for detection and/or diagnosis of SARS-CoV-2 by FDA under an Emergency Use Authorization (EUA). This EUA will remain  in effect (meaning this test can be used) for the duration of the COVID-19 declaration under Section 564(b)(1) of the Act, 21 U.S.C.section 360bbb-3(b)(1), unless the authorization is terminated  or  revoked sooner.       Influenza A by PCR NEGATIVE NEGATIVE Final   Influenza B by PCR NEGATIVE NEGATIVE Final    Comment: (NOTE) The Xpert Xpress SARS-CoV-2/FLU/RSV plus assay is intended as an aid in the diagnosis of influenza from Nasopharyngeal swab specimens and should not be used as a sole basis for treatment. Nasal washings and aspirates are unacceptable for Xpert Xpress SARS-CoV-2/FLU/RSV testing.  Fact Sheet for Patients: BloggerCourse.com  Fact Sheet for Healthcare Providers: SeriousBroker.it  This test is not yet approved or cleared by the [place] FDA and has been authorized for detection and/or diagnosis of SARS-CoV-2 by FDA under an Emergency Use Authorization (EUA). This EUA will remain in effect (meaning this test can be used) for the duration of the COVID-19 declaration under Section 564(b)(1) of the Act, 21 U.S.C. section 360bbb-3(b)(1), unless the authorization is terminated or revoked.     Resp Syncytial Virus by PCR NEGATIVE NEGATIVE Final    Comment: (NOTE)  Fact Sheet for Patients: BloggerCourse.com  Fact Sheet for Healthcare Providers: SeriousBroker.it  This test is not yet approved or cleared by the [place] FDA and has been authorized for detection and/or diagnosis of SARS-CoV-2 by FDA under an Emergency Use Authorization (EUA). This EUA will remain in effect (meaning this test can be used) for the duration of the COVID-19 declaration under Section 564(b)(1) of the Act, 21 U.S.C. section 360bbb-3(b)(1), unless the authorization is terminated or revoked.  Performed at [facility], [address] [place], [place] [address]     Labs: CBC: Recent Labs  Lab 05/15/22 1535 05/16/22 0557  WBC 5.9 9.2  NEUTROABS 2.5  --   HGB 12.7* 12.9*  HCT 41.3 40.2  MCV 100.2* 94.6  PLT 291 314   Basic Metabolic Panel: Recent Labs   Lab 05/15/22 1535 05/15/22 1625 05/16/22 0557  NA 142  --  138  K 3.3*  --  3.9  CL 117*  --  107  CO2 19*  --  24  GLUCOSE 64*  --  83  BUN 10  --  12  CREATININE 0.63  --  0.77  CALCIUM 7.0*  --  8.5*  MG  --  2.0  --    Liver Function Tests: Recent Labs  Lab 05/15/22 1535  AST 21  ALT 15  ALKPHOS 41  BILITOT 0.4  PROT 5.8*  ALBUMIN 3.2*   CBG: Recent Labs  Lab 05/15/22 1449 05/15/22 1919 05/15/22 2210 05/16/22 0745 05/16/22 1202  GLUCAP 138* 85 108* 87 160*    Discharge time spent: greater than 30 minutes.  Signed: Marrion Coy, MD [facility] 05/16/2022

## 2022-05-16 NOTE — Care Management CC44 (Signed)
Condition Code 44 Documentation Completed  Patient Details  Name: Mahdi Frye MRN: [id number] Date of Birth: [birth year]   Condition Code 44 given:  Yes Patient signature on Condition Code 44 notice:  Yes Documentation of 2 MD's agreement:  Yes Code 44 added to claim:  Yes    Judeen Hammans Dyann Goodspeed, LCSW 05/16/2022, 12:59 PM

## 2022-05-20 LAB — LACOSAMIDE: Lacosamide: 6.7 ug/mL (ref 5.0–10.0)

## 2022-05-23 ENCOUNTER — Ambulatory Visit: Payer: Medicare Other | Attending: Otolaryngology

## 2022-05-23 DIAGNOSIS — R0683 Snoring: Secondary | ICD-10-CM | POA: Diagnosis not present

## 2022-05-24 ENCOUNTER — Other Ambulatory Visit: Payer: Self-pay

## 2022-05-24 ENCOUNTER — Emergency Department: Payer: Medicare Other

## 2022-05-24 ENCOUNTER — Emergency Department
Admission: EM | Admit: 2022-05-24 | Discharge: 2022-05-24 | Disposition: A | Discharge status: Home / Self Care | Payer: Medicare Other | Attending: Emergency Medicine | Admitting: Emergency Medicine

## 2022-05-24 DIAGNOSIS — R4182 Altered mental status, unspecified: Secondary | ICD-10-CM | POA: Diagnosis present

## 2022-05-24 LAB — URINE DRUG SCREEN, QUALITATIVE (ARMC ONLY)
Amphetamines, Ur Screen: NOT DETECTED
Barbiturates, Ur Screen: NOT DETECTED
Benzodiazepine, Ur Scrn: NOT DETECTED
Cannabinoid 50 Ng, Ur ~~LOC~~: NOT DETECTED
Cocaine Metabolite,Ur ~~LOC~~: NOT DETECTED
MDMA (Ecstasy)Ur Screen: NOT DETECTED
Methadone Scn, Ur: NOT DETECTED
Opiate, Ur Screen: NOT DETECTED
Phencyclidine (PCP) Ur S: NOT DETECTED
Tricyclic, Ur Screen: NOT DETECTED

## 2022-05-24 LAB — COMPREHENSIVE METABOLIC PANEL
ALT: 24 U/L (ref 0–44)
AST: 27 U/L (ref 15–41)
Albumin: 4.4 g/dL (ref 3.5–5.0)
Alkaline Phosphatase: 46 U/L (ref 38–126)
Anion gap: 6 (ref 5–15)
BUN: 13 mg/dL (ref 6–20)
CO2: 29 mmol/L (ref 22–32)
Calcium: 9.1 mg/dL (ref 8.9–10.3)
Chloride: 104 mmol/L (ref 98–111)
Creatinine, Ser: 0.86 mg/dL (ref 0.61–1.24)
GFR, Estimated: >60 mL/min (ref >60)
Glucose, Bld: 91 mg/dL (ref 70–99)
Potassium: 3.8 mmol/L (ref 3.5–5.1)
Sodium: 139 mmol/L (ref 135–145)
Total Bilirubin: 0.5 mg/dL (ref 0.3–1.2)
Total Protein: 7.7 g/dL (ref 6.5–8.1)

## 2022-05-24 LAB — CBC WITH DIFFERENTIAL/PLATELET
Abs Immature Granulocytes: 0.01 10*3/uL (ref 0.00–0.07)
Basophils Absolute: 0 10*3/uL (ref 0.0–0.1)
Basophils Relative: 1 %
Eosinophils Absolute: 0.1 10*3/uL (ref 0.0–0.5)
Eosinophils Relative: 2 %
HCT: 41 % (ref 39.0–52.0)
Hemoglobin: 13.3 g/dL (ref 13.0–17.0)
Immature Granulocytes: 0 %
Lymphocytes Relative: 44 %
Lymphs Abs: 2.4 10*3/uL (ref 0.7–4.0)
MCH: 30.6 pg (ref 26.0–34.0)
MCHC: 32.4 g/dL (ref 30.0–36.0)
MCV: 94.5 fL (ref 80.0–100.0)
Monocytes Absolute: 0.5 10*3/uL (ref 0.1–1.0)
Monocytes Relative: 9 %
Neutro Abs: 2.5 10*3/uL (ref 1.7–7.7)
Neutrophils Relative %: 44 %
Platelets: 301 10*3/uL (ref 150–400)
RBC: 4.34 MIL/uL (ref 4.22–5.81)
RDW: 12.9 % (ref 11.5–15.5)
WBC: 5.5 10*3/uL (ref 4.0–10.5)
nRBC: 0 % (ref 0.0–0.2)

## 2022-05-24 LAB — BLOOD GAS, VENOUS
Acid-Base Excess: 3.3 mmol/L — ABNORMAL HIGH (ref 0.0–2.0)
Bicarbonate: 29.1 mmol/L — ABNORMAL HIGH (ref 20.0–28.0)
O2 Saturation: 45.6 %
Patient temperature: 37
pCO2, Ven: 48 mmHg (ref 44–60)
pH, Ven: 7.39 (ref 7.25–7.43)
pO2, Ven: 35 mmHg (ref 32–45)

## 2022-05-24 LAB — MAGNESIUM: Magnesium: 2 mg/dL (ref 1.7–2.4)

## 2022-05-24 LAB — T4, FREE: Free T4: 0.88 ng/dL (ref 0.61–1.12)

## 2022-05-24 LAB — PHOSPHORUS: Phosphorus: 3 mg/dL (ref 2.5–4.6)

## 2022-05-24 LAB — TSH: TSH: 3.155 u[IU]/mL (ref 0.350–4.500)

## 2022-05-24 LAB — LACTIC ACID, PLASMA: Lactic Acid, Venous: 0.9 mmol/L (ref 0.5–1.9)

## 2022-05-24 NOTE — ED Provider Notes (Signed)
[facility] Provider Note    Event Date/Time   First MD Initiated Contact with Patient 05/24/22 1142     (approximate)   History   Altered Mental Status   HPI  Eddie Berry is a 37 y.o. male   Past medical history of TBI, seizures, tobacco use who presents to the emergency department from sleep study earlier today with altered mental status.  Was obtaining workup when he became unresponsive, tracking with his eyes but his baseline.  Was sent over to the emergency department for evaluation.  Limited information obtained from patient who is nonverbal at this time.  Not moving any of his extremities but eyes are open and is able to track.  Independent Historian contributed to assessment above: group Home staff member at bedside: Group home member is at bedside stating that this is the patient's normal state of behavior and will have occasionally these spells that self resolved at the group home.  This is not unusual to the group home staff member who is at bedside.  Today he is in the emergency department because at his sleep center the staff there were unfamiliar with these patterns and sent to the emergency department.  External Medical Documents Reviewed: Discharge summary dated 05/16/2022 with similar occurrence of transient altered mental status with normal antiepileptic levels and was discharged back to group home.      Physical Exam   Triage Vital Signs: ED Triage Vitals [05/24/22 1151]  Enc Vitals Group     BP 118/81     Pulse Rate 84     Resp 19     Temp 99 F (37.2 C)     Temp Source Axillary     SpO2 98 %     Weight      Height      Head Circumference      Peak Flow      Pain Score      Pain Loc      Pain Edu?      Excl. in [place]?     Most recent vital signs: Vitals:   05/24/22 1300 05/24/22 1400  BP: 106/77 115/78  Pulse: 73 74  Resp: 17 17  Temp:    SpO2: 99% 97%    General: Awake, no distress.  CV:  Good peripheral  perfusion.  Resp:  Normal effort.  Abd:  No distention.  Other:  Awake eyes open able to track.  Downgoing toes to Babinski stimulation.  Not moving his extremities.  Reevaluation approximately 20 minutes later patient points to his mouth and opens his mouth and nods yes when we ask if he is hungry.  He then goes back to his state of unresponsiveness but tracking with his eyes previously.   ED Results / Procedures / Treatments   Labs (all labs ordered are listed, but only abnormal results are displayed) Labs Reviewed  BLOOD GAS, VENOUS - Abnormal; Notable for the following components:      Result Value   Bicarbonate 29.1 (*)    Acid-Base Excess 3.3 (*)    All other components within normal limits  COMPREHENSIVE METABOLIC PANEL  LACTIC ACID, PLASMA  CBC WITH DIFFERENTIAL/PLATELET  URINE DRUG SCREEN, QUALITATIVE ([facility] ONLY)  PHOSPHORUS  MAGNESIUM  T4, FREE  TSH  LEVETIRACETAM LEVEL  VALPROIC ACID LEVEL  LACOSAMIDE  LAMOTRIGINE LEVEL     I ordered and reviewed the above labs they are notable for normal electrolytes and normal white blood cell count glucose  91  EKG  ED ECG REPORT I, Lucillie Garfinkel, the attending physician, personally viewed and interpreted this ECG.   Date: 05/24/2022  EKG Time: 1148  Rate: 69  Rhythm: nsr  Axis: nl  Intervals:none  ST&T Change: no acute ischemic changes    RADIOLOGY I independently reviewed and interpreted CT scan of the head and see no obvious bleeding or midline shift   PROCEDURES:  Critical Care performed: No  Procedures   MEDICATIONS ORDERED IN ED: Medications - No data to display  External physician / consultants:  I spoke with Dr. Sibyl Parr of neurology regarding care plan for this patient.   IMPRESSION / MDM / ASSESSMENT AND PLAN / ED COURSE  I reviewed the triage vital signs and the nursing notes.                                Patient's presentation is most consistent with acute presentation with potential threat  to life or bodily function.  Differential diagnosis includes, but is not limited to, seizure, status epilepticus, conversion disorder, electrolyte derangements, intracranial bleeding or CVA   The patient is on the cardiac monitor to evaluate for evidence of arrhythmia and/or significant heart rate changes.  MDM: Containing airway and has some improvement, waxing and waning symptoms able to track with his eyes considered seizure but less likely.  Check electrolytes, CT of the head, antiepileptic levels and I have consulted with neurologist for neurologic assessment as well as potentially spot EEG.  It is reassuring that this behavior is normal for this patient per the group home staff member who is at bedside.   Patient is slowly getting better, nonverbal but was able to write sentences down appropriately answering questions appropriately but in writing, he acknowledges me in the room nauseous head and raises his hands for a fist bump. Neurology has evaluated the patient, see their note.  Will proceed with EEG if he continues to be altered, defer if he is completely back to baseline. Antiepileptic levels will be pending and will likely not result during his stay in the emergency department if back to baseline can be discharged and he will follow-up with neurologist for these levels.  I considered hospitalization for admission or observation for ongoing monitoring or EEG but since patient is back at baseline and neurology consulted thinks that EEG is not necessary at this point, patient without complaints and workup as above as unremarkable plan discharge back to group home with neurology follow-up.        FINAL CLINICAL IMPRESSION(S) / ED DIAGNOSES   Final diagnoses:  Altered mental status, unspecified altered mental status type     Rx / DC Orders   ED Discharge Orders     None        Note:  This document was prepared using Dragon voice recognition software and may include  unintentional dictation errors.    Lucillie Garfinkel, MD 05/24/22 [phone]

## 2022-05-24 NOTE — Consult Note (Addendum)
Neurology Consultation Reason for Consult: c/f breakthrough seizure Requesting Physician: Pilar Jarvis  CC: Poor responsiveness  History is obtained from: Patient and chart review, group home staff member at bedside  HPI: Eddie Berry is a 37 y.o. 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  He was brought in for becoming suddenly unresponsive during a sleep study.  He was spaced beginning ready to take a 30-minute nap, had eaten breakfast normally and was flipping through TV channels when he suddenly stopped talking and moving but was still following with his eyes.  He was brought to the ED for further evaluation.  Group home staff member at bedside reports this is a normal behavioral spell for him and they treated with supportive care but due to it happening while he was at an outpatient procedure he was brought to the ED at the request of the staff there  He is able to write to me "I can see but I cannot understand everything I hear" when I write to him "I think you will be okay, give me a thumbs up please?"  He underlines I think you will be okay and writes "I don't know that. I can't talk" I write "50% of people with seizures have other spells to that are not seizures and get better with time and relaxation" and he seems to visibly relax after this statement  Subsequently he writes that he wants soda to drink  Notably he was recently evaluated by my colleague Dr. Wilford Corner on 05/15/2022 with concern for conversion disorder versus breakthrough seizure.  EEG was negative, antiseizure medication levels were within therapeutic range, and he improved with supportive care  ROS: Limited by patient participation, no acute issues per group home staff member at bedside  Past Medical History:  Diagnosis Date   Seizures (HCC)    TBI (traumatic brain injury) (HCC)    Past Surgical History:  Procedure Laterality Date   ANKLE  SURGERY     APPENDECTOMY     BRAIN SURGERY     LUNG SURGERY     Current Outpatient Medications  Medication Instructions   acetaminophen (TYLENOL) 500 mg, Oral, Every 6 hours PRN   albuterol (VENTOLIN HFA) 108 (90 Base) MCG/ACT inhaler 2 puffs, Inhalation, Every 6 hours PRN   azelastine (ASTELIN) 0.1 % nasal spray 2 sprays, Each Nare, As needed, Use in each nostril as directed   cetirizine (ZYRTEC) 10 mg, Oral, Daily   Cholecalciferol (VITAMIN D3) 50 MCG (2000 UT) TABS 1 tablet, Oral, Daily   clozapine (CLOZARIL) 200 mg, Oral, Daily   divalproex (DEPAKOTE) 500 mg, Oral, 2 times daily   docusate sodium (COLACE) 100 mg, Oral, 2 times daily   fenofibrate (TRICOR) 48 mg, Oral, Daily   fluticasone (FLONASE) 50 MCG/ACT nasal spray 2 sprays, Each Nare, Daily   hydroxypropyl methylcellulose / hypromellose (ISOPTO TEARS / GONIOVISC) 2.5 % ophthalmic solution 2 drops, Right Eye, 4 times daily   lacosamide (VIMPAT) 200 mg, Oral, 2 times daily   levothyroxine (SYNTHROID) 25 mcg, Oral, Daily before breakfast   Melatonin 10 MG TABS 1 tablet, Oral, Daily at bedtime   mirabegron ER (MYRBETRIQ) 50 mg, Oral, Daily   Multiple Vitamin (THEREMS PO) 1 tablet, Oral, Daily   omeprazole (PRILOSEC) 20 mg, Oral, Daily   paliperidone (INVEGA) 6 mg, Oral, Daily   propranolol (INDERAL) 10 mg, Oral, Daily   sertraline (ZOLOFT) 50 mg, Oral, Daily at bedtime   sertraline (ZOLOFT) 100  mg, Oral, Daily   traZODone (DESYREL) 50 mg, Oral, Daily at bedtime   No family history on file.  Social History:  reports that he quit smoking about 2 years ago. His smoking use included cigarettes. He has a 22.00 pack-year smoking history. He has never used smokeless tobacco. He reports that he does not currently use alcohol. He reports that he does not currently use drugs.   Exam: Current vital signs: BP 118/81   Pulse 84   Temp 99 F (37.2 C) (Axillary)   Resp 19   SpO2 98%  Vital signs in last 24 hours: Temp:  [99 F  (37.2 C)] 99 F (37.2 C) (01/19 1151) Pulse Rate:  [84] 84 (01/19 1151) Resp:  [19] 19 (01/19 1151) BP: (118)/(81) 118/81 (01/19 1151) SpO2:  [98 %] 98 % (01/19 1151)   Physical Exam  Constitutional: Appears well-developed and well-nourished.  Psych: Expressive face, appears mildly anxious Eyes: No scleral injection HENT: No oropharyngeal obstruction.  MSK: no joint deformities.  Cardiovascular: Normal rate and regular rhythm. Perfusing extremities well Respiratory: Effort normal, non-labored breathing GI: Soft.  No distension. There is no tenderness.  Skin: Warm dry and intact visible skin  Neuro: Mental Status: Awake, alert, communicating needs well via writing, with initially very sloppy writing but improved with slowing his rate of writing Cranial Nerves: II: Orients to stimuli in all visual fields.  Pupils are equal, round, and reactive to light.   III,IV, VI: EOMI to tracking examiner V: Facial sensation is symmetric to light eyelash brush VII: Baseline right facial paralysis VIII: hearing is intact to voice Motor: Does not move any of his limbs to command, but protects his body when limbs are held above him and dropped  Sensory: Grossly intact Cerebellar: Intact for reaching for objects he wants to hold and sufficient coordination to write Gait:  Deferred   I have reviewed labs in epic and the results pertinent to this consultation are:   Basic Metabolic Panel: Recent Labs  Lab 05/24/22 1143  NA 139  K 3.8  CL 104  CO2 29  GLUCOSE 91  BUN 13  CREATININE 0.86  CALCIUM 9.1  MG 2.0  PHOS 3.0    CBC: Recent Labs  Lab 05/24/22 1143  WBC 5.5  NEUTROABS 2.5  HGB 13.3  HCT 41.0  MCV 94.5  PLT 301    Coagulation Studies: No results for input(s): "LABPROT", "INR" in the last 72 hours.    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  If symptoms are ongoing will obtain stat EEG to rule out focal nonconvulsive status.  Laboratory values, vitals and examination is reassuring (and notable for significant functional overlay).   Recommendations: -Lamotrigine level, valproic acid level, Keppra level to confirm adherence -Stat EEG to be completed if patient does not resolve fully to baseline in the next few minutes -If EEG is reassuring and/or patient returns to baseline, he may be discharged with close outpatient follow-up  Addendum: On additional discussion with EEG technician, EEG obtained during his last admission the patient was in the same state (nonverbal but able to write and interact).  Therefore  I expect he will continue to improve and I do not think repeat EEG will be useful  Lesleigh Noe MD-PhD [facility] [phone] [facility] coverage for [facility] is from 8 AM to 4 AM in-house and 4 PM to 8 PM by telephone/video. 8 PM to 8 AM emergent questions or overnight urgent questions should be addressed to Teleneurology On-call or [facility]ac[facility] Pontes neurohospitalist; contact information can be found on AMION

## 2022-05-24 NOTE — Procedures (Signed)
Patient is same as last time I interacted with him on 05/15/22-nonverbal-writes, no eeg correlate. Eeg be cx for now.

## 2022-05-24 NOTE — Discharge Instructions (Signed)
Call your neurologist for a follow-up appointment this week.  Have them review your seizure medicine levels that we obtained today.  Thank you for choosing [place] for your health care today!  Please see your primary doctor this week for a follow up appointment.   Sometimes, in the early stages of certain disease courses it is difficult to detect in the emergency department evaluation -- so, it is important that you continue to monitor your symptoms and call your doctor right away or return to the emergency department if you develop any new or worsening symptoms.  Please go to the following website to schedule new (and existing) patient appointments:   [email or web address]  If you do not have a primary doctor try calling the following clinics to establish care:  If you have insurance:  [facility] [phone] [place] [address]   [facility]  [phone] [place] [place] [address]   If you do not have insurance:  [facility]  [phone] [address] [place] [address]   The following is another list of primary care offices in the area who are accepting new patients at this time.  Please reach out to one of them directly and let them know you would like to schedule an appointment to follow up on an Emergency Department visit, and/or to establish a new primary care provider (PCP).  There are likely other primary care clinics in the are who are accepting new patients, but this is an excellent place to start:  [place] physician: Dr Lavon Paganini [address] [place], [place] [address] [phone]  [facility] Lead Physician: Dr Steele Sizer [address], [place], [place] [address] [phone]  [place] Physician: Dr Park Liter [address] [place], [place] [address] [phone]  [facility] Lead Physician: Dr Dewaine Oats [place], [place], [place] [address] [phone]  [facility] Physician: Dr Halina Maidens [address] [place], [place] [address] [phone]   It was my pleasure to care for you today.   [name] [name], MD

## 2022-05-24 NOTE — ED Notes (Signed)
Pt sitting upright and communicating with paper and pen. MD notified. Pt given drink

## 2022-05-24 NOTE — ED Triage Notes (Signed)
Pt at sleep study and responding to staff when pt stopped responding but would follow with eyes. EMS reported he was unresponsive to pain but continued to respond with eye movement. From group home and staff report he has done this before and becomes responsive with food/pop. Hx of seizures and TBI

## 2022-05-26 LAB — LEVETIRACETAM LEVEL: Levetiracetam Lvl: <2 ug/mL — ABNORMAL LOW (ref 10.0–40.0)

## 2022-06-07 ENCOUNTER — Telehealth: Payer: Self-pay | Admitting: Primary Care
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
## 2022-06-07 NOTE — Telephone Encounter (Signed)
Can we schedule 30 min visit with any of our Sleep doctors. He had PSG that was negative for OSA. MSLT was not completed, it was noted patient stopped responding verbally after nap 2 and EMS was called.

## 2022-06-07 NOTE — Telephone Encounter (Signed)
Eddie Berry order sleep study and MSLT the sleep study was done 05/23/2022 and is scanned into Epic

## 2022-06-10 NOTE — Telephone Encounter (Signed)
ATC patients legal gardian, Normand Sloop.  LVM to return call.

## 2022-06-11 ENCOUNTER — Emergency Department: Payer: Medicare Other

## 2022-06-11 ENCOUNTER — Other Ambulatory Visit: Payer: Self-pay

## 2022-06-11 ENCOUNTER — Emergency Department
Admission: EM | Admit: 2022-06-11 | Discharge: 2022-06-11 | Disposition: A | Discharge status: Home / Self Care | Payer: Medicare Other | Attending: Emergency Medicine | Admitting: Emergency Medicine

## 2022-06-11 DIAGNOSIS — Z87891 Personal history of nicotine dependence: Secondary | ICD-10-CM | POA: Diagnosis not present

## 2022-06-11 DIAGNOSIS — F39 Unspecified mood [affective] disorder: Secondary | ICD-10-CM | POA: Diagnosis not present

## 2022-06-11 DIAGNOSIS — F0630 Mood disorder due to known physiological condition, unspecified: Secondary | ICD-10-CM | POA: Diagnosis not present

## 2022-06-11 DIAGNOSIS — S0990XA Unspecified injury of head, initial encounter: Secondary | ICD-10-CM

## 2022-06-11 DIAGNOSIS — S060X9A Concussion with loss of consciousness of unspecified duration, initial encounter: Secondary | ICD-10-CM | POA: Diagnosis not present

## 2022-06-11 DIAGNOSIS — R569 Unspecified convulsions: Secondary | ICD-10-CM | POA: Insufficient documentation

## 2022-06-11 DIAGNOSIS — Z62892 Runaway (from current living environment): Secondary | ICD-10-CM | POA: Insufficient documentation

## 2022-06-11 DIAGNOSIS — S069XAA Unspecified intracranial injury with loss of consciousness status unknown, initial encounter: Secondary | ICD-10-CM | POA: Diagnosis present

## 2022-06-11 LAB — COMPREHENSIVE METABOLIC PANEL
ALT: 14 U/L (ref 0–44)
AST: 20 U/L (ref 15–41)
Albumin: 4.3 g/dL (ref 3.5–5.0)
Alkaline Phosphatase: 45 U/L (ref 38–126)
Anion gap: 7 (ref 5–15)
BUN: 14 mg/dL (ref 6–20)
CO2: 26 mmol/L (ref 22–32)
Calcium: 9 mg/dL (ref 8.9–10.3)
Chloride: 104 mmol/L (ref 98–111)
Creatinine, Ser: 0.97 mg/dL (ref 0.61–1.24)
GFR, Estimated: >60 mL/min (ref >60)
Glucose, Bld: 100 mg/dL — ABNORMAL HIGH (ref 70–99)
Potassium: 3.8 mmol/L (ref 3.5–5.1)
Sodium: 137 mmol/L (ref 135–145)
Total Bilirubin: 0.6 mg/dL (ref 0.3–1.2)
Total Protein: 7.6 g/dL (ref 6.5–8.1)

## 2022-06-11 LAB — CBC
HCT: 38.5 % — ABNORMAL LOW (ref 39.0–52.0)
Hemoglobin: 12.4 g/dL — ABNORMAL LOW (ref 13.0–17.0)
MCH: 30.5 pg (ref 26.0–34.0)
MCHC: 32.2 g/dL (ref 30.0–36.0)
MCV: 94.8 fL (ref 80.0–100.0)
Platelets: 302 10*3/uL (ref 150–400)
RBC: 4.06 MIL/uL — ABNORMAL LOW (ref 4.22–5.81)
RDW: 12.7 % (ref 11.5–15.5)
WBC: 8.9 10*3/uL (ref 4.0–10.5)
nRBC: 0 % (ref 0.0–0.2)

## 2022-06-11 LAB — URINE DRUG SCREEN, QUALITATIVE (ARMC ONLY)
Amphetamines, Ur Screen: NOT DETECTED
Barbiturates, Ur Screen: NOT DETECTED
Benzodiazepine, Ur Scrn: NOT DETECTED
Cannabinoid 50 Ng, Ur ~~LOC~~: NOT DETECTED
Cocaine Metabolite,Ur ~~LOC~~: NOT DETECTED
MDMA (Ecstasy)Ur Screen: NOT DETECTED
Methadone Scn, Ur: NOT DETECTED
Opiate, Ur Screen: NOT DETECTED
Phencyclidine (PCP) Ur S: NOT DETECTED
Tricyclic, Ur Screen: NOT DETECTED

## 2022-06-11 LAB — SALICYLATE LEVEL: Salicylate Lvl: <7 mg/dL — ABNORMAL LOW (ref 7.0–30.0)

## 2022-06-11 LAB — VALPROIC ACID LEVEL: Valproic Acid Lvl: 51 ug/mL (ref 50.0–100.0)

## 2022-06-11 LAB — ACETAMINOPHEN LEVEL: Acetaminophen (Tylenol), Serum: <10 ug/mL — ABNORMAL LOW (ref 10–30)

## 2022-06-11 LAB — ETHANOL: Alcohol, Ethyl (B): <10 mg/dL (ref <10)

## 2022-06-11 MED ORDER — VALPROATE SODIUM 100 MG/ML IV SOLN: 500.0000 mg | Freq: Once | INTRAVENOUS | Status: DC

## 2022-06-11 MED ORDER — LEVETIRACETAM IN NACL 1500 MG/100ML IV SOLN
1500.0000 mg | Freq: Once | INTRAVENOUS | Status: AC
  Administered 2022-06-11: 1500 mg via INTRAVENOUS
  Filled 2022-06-11: qty 100

## 2022-06-11 MED ORDER — LORAZEPAM 2 MG/ML IJ SOLN
2.0000 mg | Freq: Once | INTRAMUSCULAR | Status: AC
  Administered 2022-06-11: 2 mg via INTRAVENOUS

## 2022-06-11 NOTE — ED Notes (Addendum)
Pt belongings:  Red shirt Blue jean Gray shoes and socks Navy blue underwear Red vest Red hat Black jacket Black watch with blue trim

## 2022-06-11 NOTE — ED Notes (Signed)
PT given 2 mg of Ativan IV in CT for Seizure

## 2022-06-11 NOTE — ED Notes (Signed)
Report to Meghan, RN 

## 2022-06-11 NOTE — ED Notes (Signed)
Pt sleeping. 

## 2022-06-11 NOTE — ED Notes (Signed)
Pt Dc to home. Dc instructions reviewed with caregiver, Jenny Reichmann, in room with understanding verbalized. Iv removed, cath intact, pressure dressing applied. No bleeding noted at site. Pt assisted out of dept via wheelchair with caregiver
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
## 2022-06-11 NOTE — ED Notes (Signed)
Pt sleepy, awakens to voice. Oriented to person, place, situation.

## 2022-06-11 NOTE — ED Notes (Signed)
VOLUNTARY and awaiting clearance from medical eval, TTS and [facility] consult complete
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
## 2022-06-11 NOTE — Discharge Instructions (Signed)
Your evaluated by her psychiatrist who did not feel that you needed inpatient admission.  He did have a seizure while in the emergency department.  Please follow-up with your neurologist.  CAT scans of your head and face did not show any acute injuries.  You can take Tylenol and Motrin for pain.
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
## 2022-06-11 NOTE — ED Notes (Signed)
Spoke with Eddie Berry at [facility], [phone], to update on pt discharge. Eddie Berry will be by to pick up pt tonight.

## 2022-06-12 NOTE — Telephone Encounter (Signed)
Appt scheduled with VS for 08/19/22

## 2022-08-19 ENCOUNTER — Ambulatory Visit (HOSPITAL_BASED_OUTPATIENT_CLINIC_OR_DEPARTMENT_OTHER): Payer: Medicare Other | Admitting: Pulmonary Disease

## 2022-10-11 ENCOUNTER — Emergency Department: Payer: Medicare Other

## 2022-10-11 ENCOUNTER — Other Ambulatory Visit: Payer: Self-pay

## 2022-10-11 ENCOUNTER — Emergency Department
Admission: EM | Admit: 2022-10-11 | Discharge: 2022-10-11 | Disposition: A | Discharge status: Home / Self Care | Payer: Medicare Other | Attending: Emergency Medicine | Admitting: Emergency Medicine

## 2022-10-11 DIAGNOSIS — S0083XA Contusion of other part of head, initial encounter: Secondary | ICD-10-CM | POA: Insufficient documentation

## 2022-10-11 DIAGNOSIS — S0993XA Unspecified injury of face, initial encounter: Secondary | ICD-10-CM | POA: Diagnosis present

## 2022-10-11 DIAGNOSIS — Y92199 Unspecified place in other specified residential institution as the place of occurrence of the external cause: Secondary | ICD-10-CM | POA: Insufficient documentation

## 2022-10-11 DIAGNOSIS — T148XXA Other injury of unspecified body region, initial encounter: Secondary | ICD-10-CM

## 2022-10-11 DIAGNOSIS — M25561 Pain in right knee: Secondary | ICD-10-CM | POA: Insufficient documentation

## 2022-10-11 DIAGNOSIS — S0001XA Abrasion of scalp, initial encounter: Secondary | ICD-10-CM | POA: Diagnosis not present

## 2022-10-11 DIAGNOSIS — S80212A Abrasion, left knee, initial encounter: Secondary | ICD-10-CM | POA: Insufficient documentation

## 2022-10-11 LAB — URINE DRUG SCREEN, QUALITATIVE (ARMC ONLY)
Amphetamines, Ur Screen: NOT DETECTED
Barbiturates, Ur Screen: NOT DETECTED
Benzodiazepine, Ur Scrn: NOT DETECTED
Cannabinoid 50 Ng, Ur ~~LOC~~: NOT DETECTED
Cocaine Metabolite,Ur ~~LOC~~: NOT DETECTED
MDMA (Ecstasy)Ur Screen: NOT DETECTED
Methadone Scn, Ur: NOT DETECTED
Opiate, Ur Screen: NOT DETECTED
Phencyclidine (PCP) Ur S: NOT DETECTED
Tricyclic, Ur Screen: POSITIVE — AB

## 2022-10-11 LAB — CBC
HCT: 41.3 % (ref 39.0–52.0)
Hemoglobin: 13.4 g/dL (ref 13.0–17.0)
MCH: 31 pg (ref 26.0–34.0)
MCHC: 32.4 g/dL (ref 30.0–36.0)
MCV: 95.6 fL (ref 80.0–100.0)
Platelets: 289 10*3/uL (ref 150–400)
RBC: 4.32 MIL/uL (ref 4.22–5.81)
RDW: 13.3 % (ref 11.5–15.5)
WBC: 7 10*3/uL (ref 4.0–10.5)
nRBC: 0 % (ref 0.0–0.2)

## 2022-10-11 LAB — BASIC METABOLIC PANEL
Anion gap: 10 (ref 5–15)
BUN: 12 mg/dL (ref 6–20)
CO2: 26 mmol/L (ref 22–32)
Calcium: 9.5 mg/dL (ref 8.9–10.3)
Chloride: 104 mmol/L (ref 98–111)
Creatinine, Ser: 0.94 mg/dL (ref 0.61–1.24)
GFR, Estimated: >60 mL/min (ref >60)
Glucose, Bld: 127 mg/dL — ABNORMAL HIGH (ref 70–99)
Potassium: 3.9 mmol/L (ref 3.5–5.1)
Sodium: 140 mmol/L (ref 135–145)

## 2022-10-11 LAB — ETHANOL: Alcohol, Ethyl (B): <10 mg/dL (ref <10)

## 2022-10-11 NOTE — ED Notes (Signed)
Unable to get in contact with legal guardian. Group home was called, got in contact with Benita Stabile and states he will be coming to pick pt up
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
## 2022-10-11 NOTE — ED Notes (Signed)
[facility] sent down . 

## 2023-01-21 ENCOUNTER — Other Ambulatory Visit: Payer: Self-pay

## 2023-01-21 ENCOUNTER — Emergency Department
Admission: EM | Admit: 2023-01-21 | Discharge: 2023-01-21 | Disposition: A | Discharge status: Home / Self Care | Payer: Medicare Other | Attending: Emergency Medicine | Admitting: Emergency Medicine

## 2023-01-21 DIAGNOSIS — Z760 Encounter for issue of repeat prescription: Secondary | ICD-10-CM | POA: Diagnosis present

## 2023-01-21 MED ORDER — LACOSAMIDE 50 MG PO TABS
200.0000 mg | ORAL_TABLET | Freq: Once | ORAL | Status: AC
  Administered 2023-01-21: 200 mg via ORAL
  Filled 2023-01-21: qty 4

## 2023-01-21 MED ORDER — LACOSAMIDE 200 MG PO TABS: 200.0000 mg | ORAL_TABLET | Freq: Two times a day (BID) | ORAL | 2 refills | Status: AC

## 2023-01-21 NOTE — ED Notes (Signed)
Legal guardian Eddie Berry called and informed of pt being seen here at the ED.

## 2023-01-21 NOTE — ED Notes (Signed)
Patient discharged to home per MD order. Patient in stable condition, and deemed medically cleared by ED provider for discharge. Discharge instructions reviewed with patient/family using "Teach Back"; verbalized understanding of medication education and administration, and information about follow-up care. Denies further concerns. ° °

## 2023-01-21 NOTE — ED Triage Notes (Signed)
Pt comes with needing medication refill of Lacosamide. Pt just ran out and did have dose today. Pt denies any other complaints,.

## 2023-01-21 NOTE — ED Notes (Signed)
Pt here for refill of Lacosamide states normally comes to the ED for the same. Pt ran our yesterday.

## 2023-01-21 NOTE — ED Provider Notes (Signed)
   [facility] Provider Note    Event Date/Time   First MD Initiated Contact with Patient 01/21/23 1048     (approximate)   History   Medication Refill   HPI  Eddie Berry is a 37 y.o. male with a history of a seizure disorder here for medication refill.  Needs his lacosamide to 100 mg twice daily refilled, no other complaints.  Here with group home representative     Physical Exam   Triage Vital Signs: ED Triage Vitals  Encounter Vitals Group     BP 01/21/23 0946 (!) 128/91     Systolic BP Percentile --      Diastolic BP Percentile --      Pulse Rate 01/21/23 0946 (!) 101     Resp 01/21/23 0946 18     Temp 01/21/23 0946 97.8 F (36.6 C)     Temp src --      SpO2 01/21/23 0946 100 %     Weight --      Height --      Head Circumference --      Peak Flow --      Pain Score 01/21/23 0945 0     Pain Loc --      Pain Education --      Exclude from Growth Chart --     Most recent vital signs: Vitals:   01/21/23 0946  BP: (!) 128/91  Pulse: (!) 101  Resp: 18  Temp: 97.8 F (36.6 C)  SpO2: 100%     General: Awake, no distress.  CV:  Good peripheral perfusion.  Resp:  Normal effort.  Abd:  No distention.  Other:     ED Results / Procedures / Treatments   Labs (all labs ordered are listed, but only abnormal results are displayed) Labs Reviewed - No data to display   EKG     RADIOLOGY     PROCEDURES:  Critical Care performed:   Procedures   MEDICATIONS ORDERED IN ED: Medications  lacosamide (VIMPAT) tablet 200 mg (has no administration in time range)     IMPRESSION / MDM / ASSESSMENT AND PLAN / ED COURSE  I reviewed the triage vital signs and the nursing notes. Patient's presentation is most consistent with acute, uncomplicated illness.  Patient here only for medication refill, will give a dose of lacosamide as he has not had a this morning, prescription sent to his pharmacy with 2 refills.  Outpatient  follow-up recommended        FINAL CLINICAL IMPRESSION(S) / ED DIAGNOSES   Final diagnoses:  Medication refill     Rx / DC Orders   ED Discharge Orders          Ordered    lacosamide (VIMPAT) 200 MG TABS tablet  2 times daily        01/21/23 1054             Note:  This document was prepared using Dragon voice recognition software and may include unintentional dictation errors.   Jene Every, MD 01/21/23 1055

## 2023-02-13 ENCOUNTER — Emergency Department
Admission: EM | Admit: 2023-02-13 | Discharge: 2023-02-13 | Disposition: A | Discharge status: Home / Self Care | Payer: Medicare Other | Attending: Emergency Medicine | Admitting: Emergency Medicine

## 2023-02-13 ENCOUNTER — Other Ambulatory Visit: Payer: Self-pay

## 2023-02-13 ENCOUNTER — Emergency Department: Payer: Medicare Other

## 2023-02-13 DIAGNOSIS — I10 Essential (primary) hypertension: Secondary | ICD-10-CM | POA: Diagnosis not present

## 2023-02-13 DIAGNOSIS — R000 Tachycardia, unspecified: Secondary | ICD-10-CM | POA: Insufficient documentation

## 2023-02-13 DIAGNOSIS — S0993XA Unspecified injury of face, initial encounter: Secondary | ICD-10-CM | POA: Diagnosis present

## 2023-02-13 DIAGNOSIS — W500XXA Accidental hit or strike by another person, initial encounter: Secondary | ICD-10-CM | POA: Diagnosis not present

## 2023-02-13 DIAGNOSIS — S0181XA Laceration without foreign body of other part of head, initial encounter: Secondary | ICD-10-CM | POA: Diagnosis not present

## 2023-02-13 MED ORDER — LIDOCAINE HCL (PF) 1 % IJ SOLN
5.0000 mL | Freq: Once | INTRAMUSCULAR | Status: AC
  Administered 2023-02-13: 5 mL via INTRADERMAL
  Filled 2023-02-13: qty 5

## 2023-02-13 NOTE — Discharge Instructions (Addendum)
Stitches will need to be removed in 5 days. This can be done by this ED, urgent care or your primary care doctor.   You can take 650 mg of Tylenol and 600 mg of ibuprofen every 6 hours as needed for pain.  Please keep the band aid on for 24 hours,  then you can remove it and wash the wound with soap and water. Please keep it covered with a bandage until the stitches are removed.
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
## 2023-02-18 ENCOUNTER — Other Ambulatory Visit: Payer: Self-pay

## 2023-02-18 ENCOUNTER — Emergency Department
Admission: EM | Admit: 2023-02-18 | Discharge: 2023-02-18 | Disposition: A | Discharge status: Home / Self Care | Payer: Medicare Other | Attending: Emergency Medicine | Admitting: Emergency Medicine

## 2023-02-18 DIAGNOSIS — Z5189 Encounter for other specified aftercare: Secondary | ICD-10-CM

## 2023-02-18 DIAGNOSIS — F17200 Nicotine dependence, unspecified, uncomplicated: Secondary | ICD-10-CM | POA: Insufficient documentation

## 2023-02-18 DIAGNOSIS — E039 Hypothyroidism, unspecified: Secondary | ICD-10-CM | POA: Diagnosis not present

## 2023-02-18 DIAGNOSIS — Z4802 Encounter for removal of sutures: Secondary | ICD-10-CM | POA: Diagnosis not present

## 2023-02-18 DIAGNOSIS — X58XXXD Exposure to other specified factors, subsequent encounter: Secondary | ICD-10-CM | POA: Insufficient documentation

## 2023-02-18 DIAGNOSIS — S0181XD Laceration without foreign body of other part of head, subsequent encounter: Secondary | ICD-10-CM | POA: Insufficient documentation

## 2023-02-18 NOTE — Discharge Instructions (Signed)
Return for any new, worsening, or change in symptoms or other concerns.  It was a pleasure caring for you today.

## 2023-02-18 NOTE — ED Provider Notes (Signed)
[facility] Provider Note    Event Date/Time   First MD Initiated Contact with Patient 02/18/23 1317     (approximate)   History   Suture / Staple Removal   HPI  Eddie Berry is a 37 y.o. male who presents today with request for suture removal.  Patient denies any concerns with these sutures.  He denies any pain at the site.  He has not had any skin changes or drainage.  No headache or facial pain.  Reports that he feels his normal self.  Patient Active Problem List   Diagnosis Date Noted   Tobacco use 05/15/2022   Hypoglycemia 05/15/2022   Medication management 02/06/2022   Lethargy 02/05/2022   Acute respiratory failure with hypoxia (HCC) 02/04/2022   CAP (community acquired pneumonia) 02/03/2022   Hypothyroidism 02/03/2022   Acute pulmonary embolism (HCC) 02/03/2022   Excessive daytime sleepiness 12/21/2021   Seizure (HCC) 06/16/2020   Mood disorder (HCC) 06/16/2020   TBI (traumatic brain injury) [facility]           Physical Exam   Triage Vital Signs: ED Triage Vitals [02/18/23 1315]  Encounter Vitals Group     BP (!) 128/91     Systolic BP Percentile      Diastolic BP Percentile      Pulse Rate 94     Resp 18     Temp 98 F (36.7 C)     Temp src      SpO2 95 %     Weight      Height      Head Circumference      Peak Flow      Pain Score 0     Pain Loc      Pain Education      Exclude from Growth Chart     Most recent vital signs: Vitals:   02/18/23 1315  BP: (!) 128/91  Pulse: 94  Resp: 18  Temp: 98 F (36.7 C)  SpO2: 95%    Physical Exam Vitals and nursing note reviewed.  Constitutional:      General: Awake and alert. No acute distress.    Appearance: Normal appearance. The patient is normal weight.  HENT:     Head: Normocephalic.  Healing 2 cm laceration to right cheek with overlying scabbing.  No surrounding erythema.  No drainage.  No dehiscence.  No tenderness to midface or nasal bone    Mouth: Mucous  membranes are moist.  Eyes:     General: PERRL. Normal EOMs        Right eye: No discharge.        Left eye: No discharge.     Conjunctiva/sclera: Conjunctivae normal.  Cardiovascular:     Rate and Rhythm: Normal rate and regular rhythm.     Pulses: Normal pulses.  Pulmonary:     Effort: Pulmonary effort is normal. No respiratory distress.     Breath sounds: Normal breath sounds.  Abdominal:     Abdomen is soft. There is no abdominal tenderness. No rebound or guarding. No distention. Musculoskeletal:        General: No swelling. Normal range of motion.     Cervical back: Normal range of motion and neck supple.  Skin:    General: Skin is warm and dry.     Capillary Refill: Capillary refill takes less than 2 seconds.     Findings: No rash.  Neurological:     Mental Status: The patient is  awake and alert. At baseline per caretaker     ED Results / Procedures / Treatments   Labs (all labs ordered are listed, but only abnormal results are displayed) Labs Reviewed - No data to display   EKG     RADIOLOGY     PROCEDURES:  Critical Care performed:   Procedures   MEDICATIONS ORDERED IN ED: Medications - No data to display   IMPRESSION / MDM / ASSESSMENT AND PLAN / ED COURSE  I reviewed the triage vital signs and the nursing notes.   Differential diagnosis includes, but is not limited to, suture removal, wound check, infection.  I reviewed the patient's chart.  Patient was seen 5 days ago at which time he had a CT face which revealed a potentially acute nasal bone fracture, however he did not have any tenderness at that area nor does he have tenderness at that area now.  Patient is awake and alert, hemodynamically stable and afebrile.  He is at his mental baseline per his caretaker who is at the bedside.  His sutures were removed without difficulty.  The wound appears to be healing well.  There is no evidence of infection.  There is no wound dehiscence.  Patient  tolerated the procedure well.  He reports that his tetanus is up-to-date.  Discussed return precautions and outpatient follow-up.  Patient understands and agrees with plan.  He was discharged in stable condition with his caretaker.   Patient's presentation is most consistent with acute complicated illness / injury requiring diagnostic workup.    FINAL CLINICAL IMPRESSION(S) / ED DIAGNOSES   Final diagnoses:  Visit for suture removal  Visit for wound check  Facial laceration, subsequent encounter     Rx / DC Orders   ED Discharge Orders     None        Note:  This document was prepared using Dragon voice recognition software and may include unintentional dictation errors.   Jackelyn Hoehn, PA-C 02/18/23 1332    Corena Herter, MD 02/18/23 1525
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
## 2023-02-18 NOTE — ED Triage Notes (Signed)
Pt to ED For suture removal to right eye, was seen on 10/10. Legal guardian aware

## 2023-03-06 IMAGING — CT CT HEAD W/O CM
3 series · 15 of 47 positions shown, 18 images · non-contrast
Comparison: Head CT 06/05/2020

CLINICAL DATA: Nonspecific dizziness.  Syncopal episode today.

EXAM:
CT HEAD WITHOUT CONTRAST
TECHNIQUE: Contiguous axial images were obtained from the base of the skull
through the vertex without intravenous contrast.

[Series 2: head wo · axial · 0.42mm/px · z∈[-162,-37]mm · 9 of 31 slices shown, 12 images]
[im 3/31  brain]
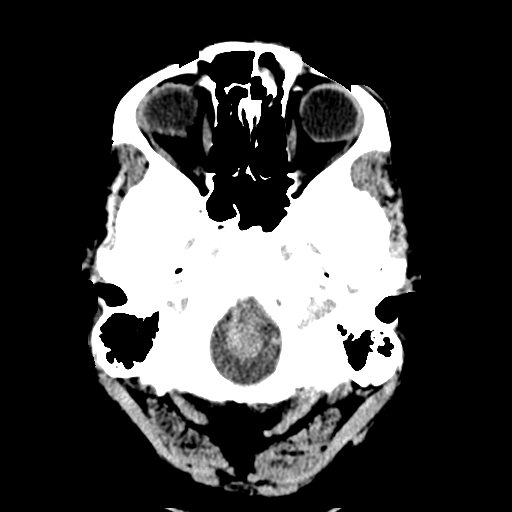
[im 3/31  bone]
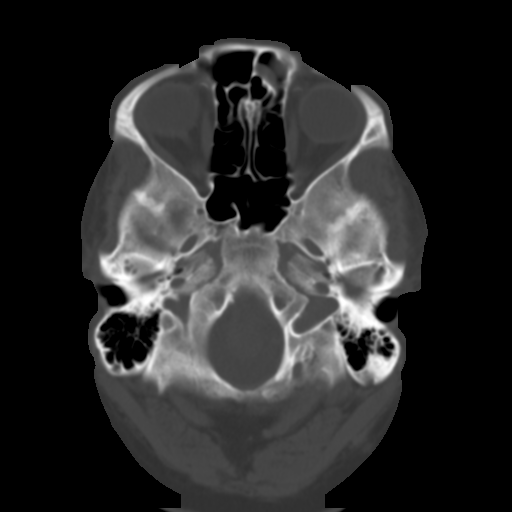
[im 6/31  brain]
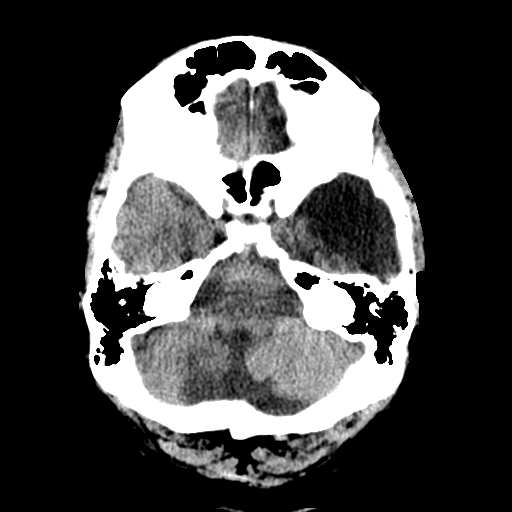
[im 9/31  brain]
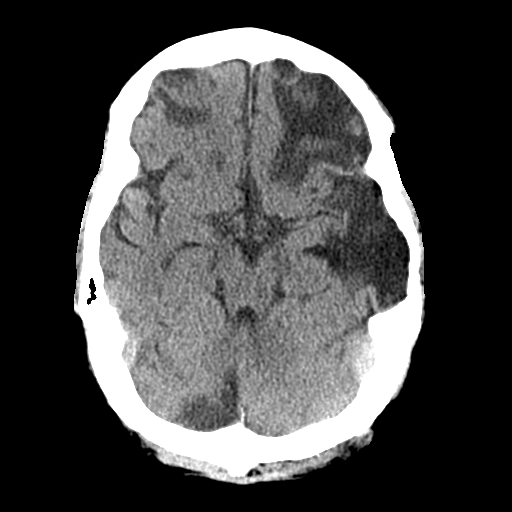
[im 12/31  brain]
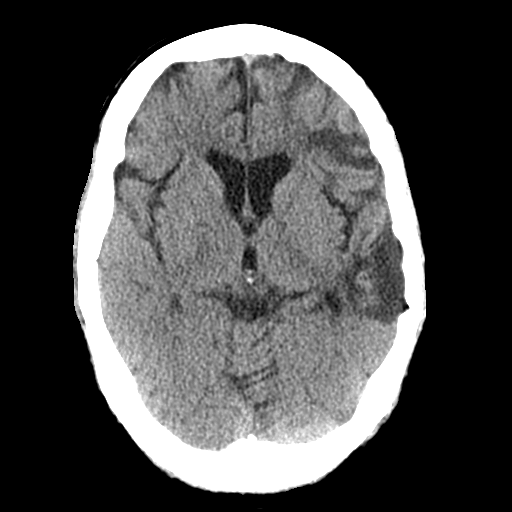
[im 16/31  brain]
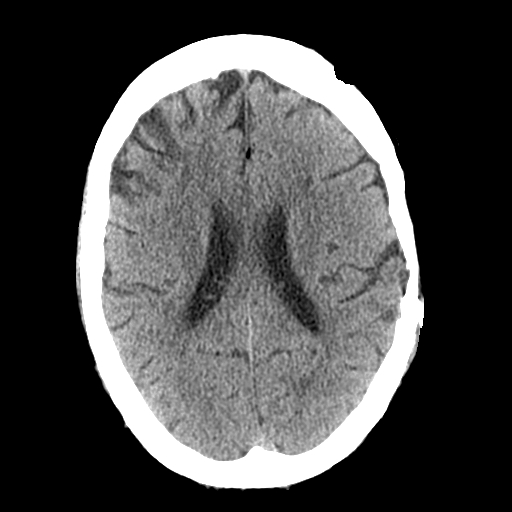
[im 16/31  bone]
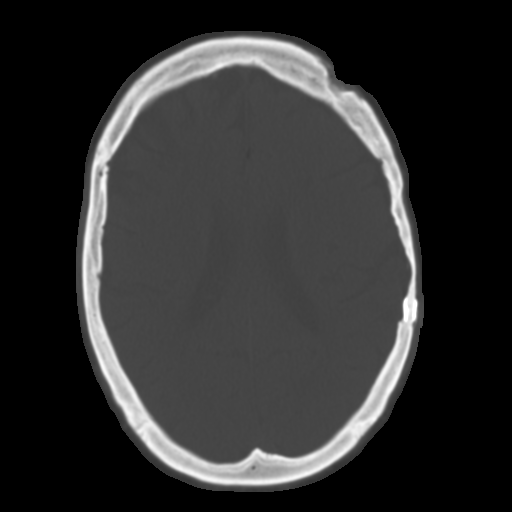
[im 19/31  brain]
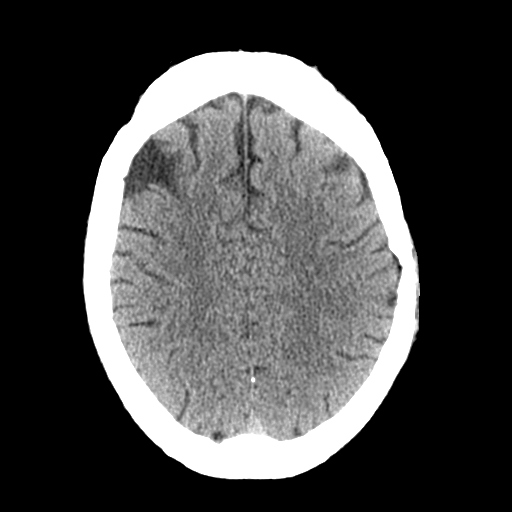
[im 22/31  brain]
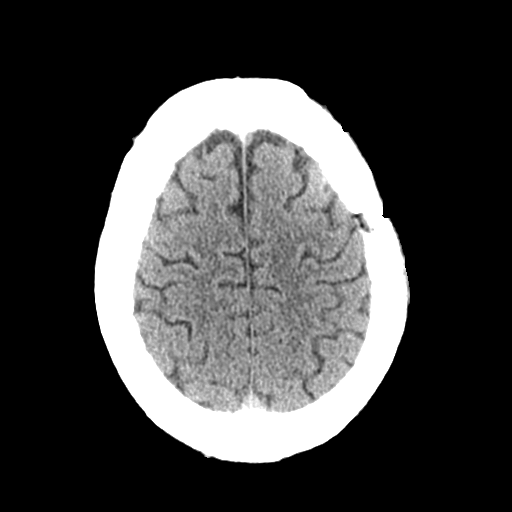
[im 25/31  brain]
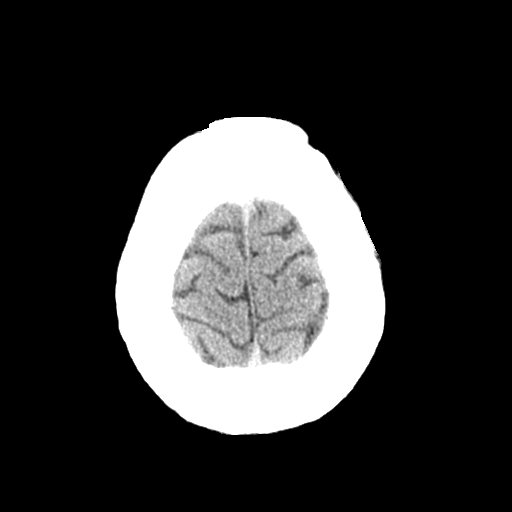
[im 28/31  brain]
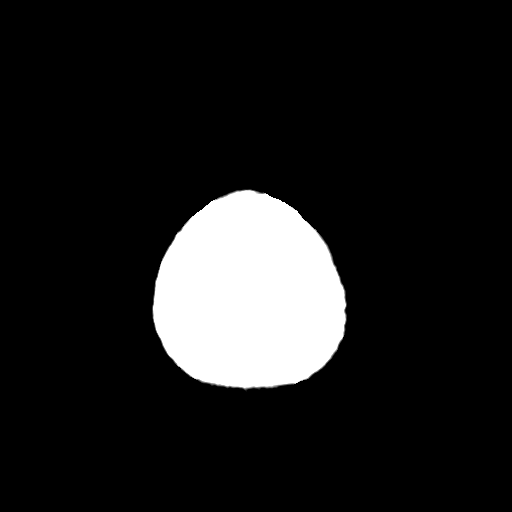
[im 28/31  bone]
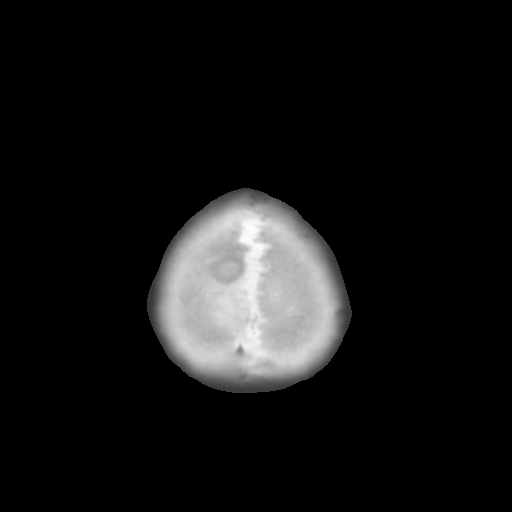

[Series 4: coronal soft tissue · coronal · 0.31mm/px · 3 of 67 slices shown]
[im 23/67  brain]
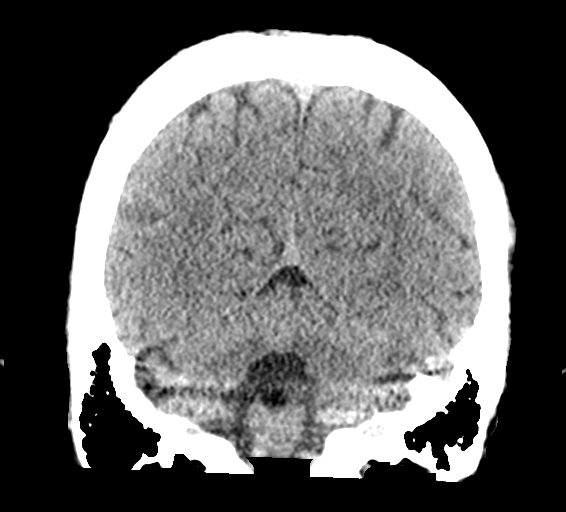
[im 30/67  brain]
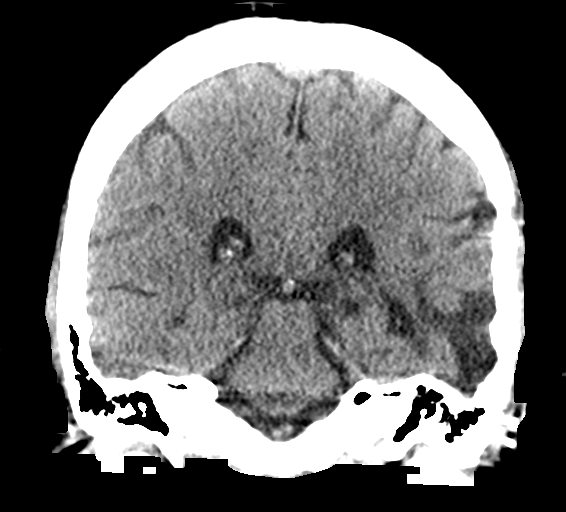
[im 37/67  brain]
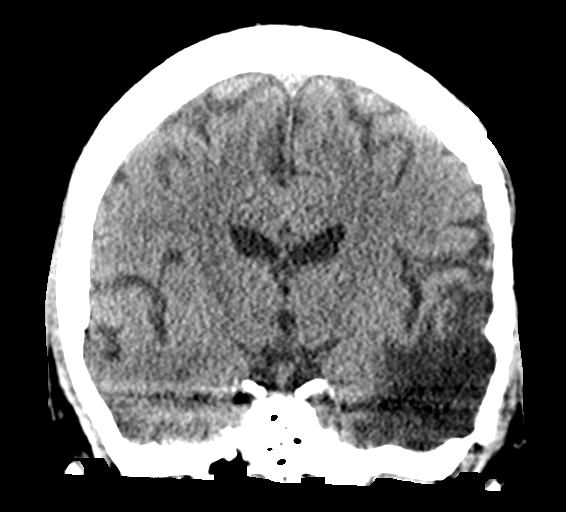

[Series 5: sagittal soft tissue · sagittal · 0.33mm/px · 3 of 55 slices shown]
[im 19/55  brain]
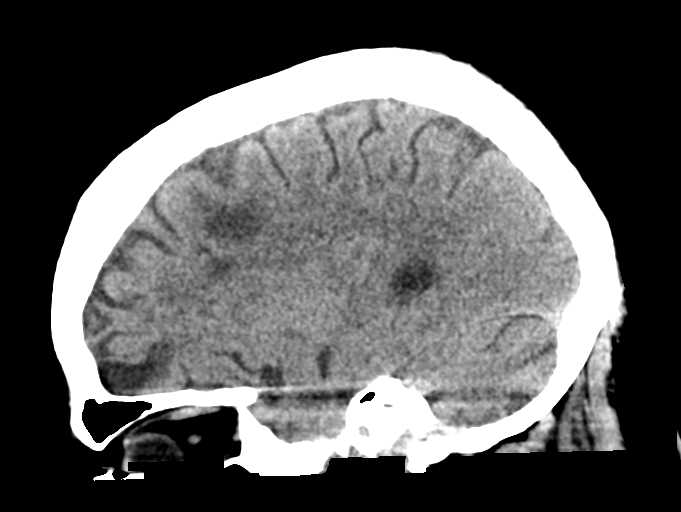
[im 28/55  brain]
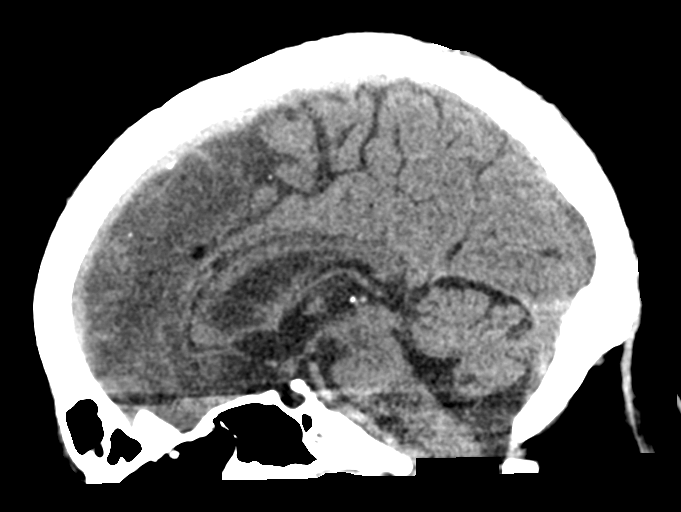
[im 37/55  brain]
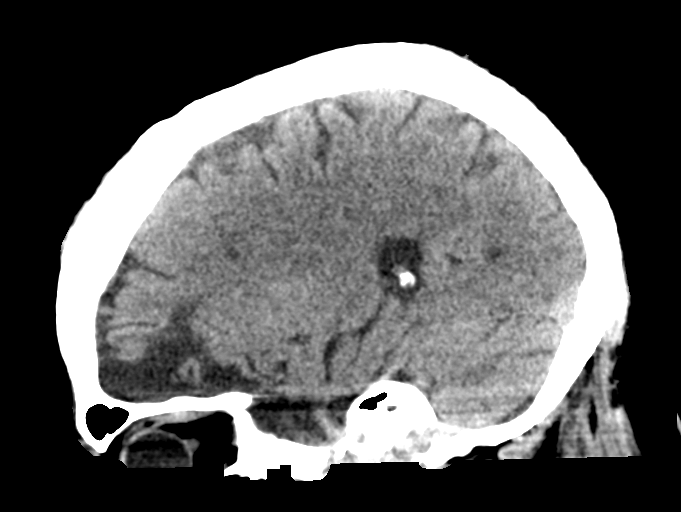

[15 of 47 positions shown; findings below may reference images not displayed]

FINDINGS: Brain: No hemorrhage. Stable multifocal areas of encephalomalacia
with involvement of the left inferior frontal lobe, left temporal
lobe, right frontal lobe, and right cerebellum. No significant
change from prior exam. No evidence of acute ischemia. There is no
subdural collection. No hydrocephalus or midline shift.

Vascular: No hyperdense vessel or unexpected calcification.

Skull: No skull fracture. Postsurgical change throughout the left
calvarium.

Sinuses/Orbits: No acute findings. No mastoid effusion. Metallic
density overlying the right globe is chronic, only partially
included in the field of view.

Other: None.
IMPRESSION: 1. No acute intracranial abnormality.
2. Stable multifocal areas of encephalomalacia.

## 2023-06-16 ENCOUNTER — Other Ambulatory Visit: Payer: Self-pay

## 2023-06-16 ENCOUNTER — Emergency Department
Admission: EM | Admit: 2023-06-16 | Discharge: 2023-06-16 | Disposition: A | Discharge status: Home / Self Care | Payer: Medicare Other | Attending: Emergency Medicine | Admitting: Emergency Medicine

## 2023-06-16 DIAGNOSIS — Z760 Encounter for issue of repeat prescription: Secondary | ICD-10-CM | POA: Insufficient documentation

## 2023-06-16 NOTE — ED Provider Triage Note (Signed)
 Emergency Medicine Provider Triage Evaluation Note  Eddie Berry , a 38 y.o. male  was evaluated in triage.  Pt complains of medication refill of clozapine . Has prescriptions for both 25 and 100 mg doses. Here with a representative from the group home [facility]. Normally has his meds refilled by [facility] but missed the appointment in January.  Has history of schizophrenia.   Review of Systems  Positive:  Negative:   Physical Exam  BP 113/77 (BP Location: Left Arm)   Pulse 83   Temp 97.8 F (36.6 C) (Oral)   Resp 18   SpO2 97%  Gen:   Awake, no distress   Resp:  Normal effort  MSK:   Moves extremities without difficulty  Other:    Medical Decision Making  Medically screening exam initiated at 1:23 PM.  Appropriate orders placed.  Eddie Berry was informed that the remainder of the evaluation will be completed by another provider, this initial triage assessment does not replace that evaluation, and the importance of remaining in the ED until their evaluation is complete.    Phyliss Breen, PA-C 06/16/23 1328

## 2023-06-16 NOTE — Discharge Instructions (Addendum)
 Clozapine  is a carefully monitored medication and needs to be filled by behavioral medicine

## 2023-06-16 NOTE — ED Triage Notes (Signed)
 Pt in via POV w/ caregiver from [employer], caregiver reports he missed his last virtual visit and is now out of his Clozapine .  Patient alert, cooperative in triage, NAD noted.

## 2023-06-16 NOTE — ED Provider Notes (Signed)
   [facility] Provider Note    Event Date/Time   First MD Initiated Contact with Patient 06/16/23 1346     (approximate)   History   Medication Refill   HPI  Eddie Berry is a 38 y.o. male is here for refill of clozapine  which he has been on for some time, has no other complaints     Physical Exam   Triage Vital Signs: ED Triage Vitals  Encounter Vitals Group     BP 06/16/23 1323 113/77     Systolic BP Percentile --      Diastolic BP Percentile --      Pulse Rate 06/16/23 1323 83     Resp 06/16/23 1323 18     Temp 06/16/23 1323 97.8 F (36.6 C)     Temp Source 06/16/23 1323 Oral     SpO2 06/16/23 1323 97 %     Weight 06/16/23 1330 104.8 kg (231 lb)     Height 06/16/23 1330 1.778 m (5\' 10" )     Head Circumference --      Peak Flow --      Pain Score 06/16/23 1326 0     Pain Loc --      Pain Education --      Exclude from Growth Chart --     Most recent vital signs: Vitals:   06/16/23 1323  BP: 113/77  Pulse: 83  Resp: 18  Temp: 97.8 F (36.6 C)  SpO2: 97%     General: Awake, no distress.  CV:  Good peripheral perfusion.  Resp:  Normal effort.  Abd:  No distention.  Other:     ED Results / Procedures / Treatments   Labs (all labs ordered are listed, but only abnormal results are displayed) Labs Reviewed - No data to display   EKG     RADIOLOGY     PROCEDURES:  Critical Care performed:   Procedures   MEDICATIONS ORDERED IN ED: Medications - No data to display   IMPRESSION / MDM / ASSESSMENT AND PLAN / ED COURSE  I reviewed the triage vital signs and the nursing notes. Patient's presentation is most consistent with acute, uncomplicated illness.  Patient with caregiver, has run out of closet pain, asymptomatic.  I am unable to refill clozapine , have referred to [facility]        FINAL CLINICAL IMPRESSION(S) / ED DIAGNOSES   Final diagnoses:  Medication refill     Rx / DC Orders   ED Discharge  Orders     None        Note:  This document was prepared using Dragon voice recognition software and may include unintentional dictation errors.   Bryson Carbine, MD 06/16/23 [phone]

## 2023-06-16 NOTE — ED Notes (Signed)
 This RN introduced herself to the patient. Patient and caregiver states patient is in ED for medication refill. Patient has been out of Clozapine  for 1-2 days. Patient in NAD at this time.

## 2023-07-31 ENCOUNTER — Emergency Department

## 2023-07-31 ENCOUNTER — Emergency Department
Admission: EM | Admit: 2023-07-31 | Discharge: 2023-07-31 | Disposition: A | Discharge status: Home / Self Care | Attending: Emergency Medicine | Admitting: Emergency Medicine

## 2023-07-31 ENCOUNTER — Encounter: Payer: Self-pay | Admitting: *Deleted

## 2023-07-31 ENCOUNTER — Other Ambulatory Visit: Payer: Self-pay

## 2023-07-31 DIAGNOSIS — J181 Lobar pneumonia, unspecified organism: Secondary | ICD-10-CM | POA: Insufficient documentation

## 2023-07-31 DIAGNOSIS — J189 Pneumonia, unspecified organism: Secondary | ICD-10-CM

## 2023-07-31 DIAGNOSIS — R509 Fever, unspecified: Secondary | ICD-10-CM | POA: Diagnosis present

## 2023-07-31 LAB — CBC WITH DIFFERENTIAL/PLATELET
Abs Immature Granulocytes: 0.05 10*3/uL (ref 0.00–0.07)
Basophils Absolute: 0 10*3/uL (ref 0.0–0.1)
Basophils Relative: 0 %
Eosinophils Absolute: 0.1 10*3/uL (ref 0.0–0.5)
Eosinophils Relative: 1 %
HCT: 39.3 % (ref 39.0–52.0)
Hemoglobin: 13.5 g/dL (ref 13.0–17.0)
Immature Granulocytes: 0 %
Lymphocytes Relative: 12 %
Lymphs Abs: 1.7 10*3/uL (ref 0.7–4.0)
MCH: 32.1 pg (ref 26.0–34.0)
MCHC: 34.4 g/dL (ref 30.0–36.0)
MCV: 93.6 fL (ref 80.0–100.0)
Monocytes Absolute: 1.7 10*3/uL — ABNORMAL HIGH (ref 0.1–1.0)
Monocytes Relative: 12 %
Neutro Abs: 10.6 10*3/uL — ABNORMAL HIGH (ref 1.7–7.7)
Neutrophils Relative %: 75 %
Platelets: 276 10*3/uL (ref 150–400)
RBC: 4.2 MIL/uL — ABNORMAL LOW (ref 4.22–5.81)
RDW: 12 % (ref 11.5–15.5)
WBC: 14.2 10*3/uL — ABNORMAL HIGH (ref 4.0–10.5)
nRBC: 0 % (ref 0.0–0.2)

## 2023-07-31 LAB — CBG MONITORING, ED: Glucose-Capillary: 132 mg/dL — ABNORMAL HIGH (ref 70–99)

## 2023-07-31 LAB — BASIC METABOLIC PANEL WITH GFR
Anion gap: 8 (ref 5–15)
BUN: 10 mg/dL (ref 6–20)
CO2: 28 mmol/L (ref 22–32)
Calcium: 9.5 mg/dL (ref 8.9–10.3)
Chloride: 101 mmol/L (ref 98–111)
Creatinine, Ser: 0.88 mg/dL (ref 0.61–1.24)
GFR, Estimated: >60 mL/min (ref >60)
Glucose, Bld: 102 mg/dL — ABNORMAL HIGH (ref 70–99)
Potassium: 3.7 mmol/L (ref 3.5–5.1)
Sodium: 137 mmol/L (ref 135–145)

## 2023-07-31 MED ORDER — AZITHROMYCIN 500 MG PO TABS
500.0000 mg | ORAL_TABLET | ORAL | Status: AC
  Administered 2023-07-31: 500 mg via ORAL
  Filled 2023-07-31: qty 1

## 2023-07-31 MED ORDER — METOCLOPRAMIDE HCL 5 MG/ML IJ SOLN
10.0000 mg | INTRAMUSCULAR | Status: AC
  Administered 2023-07-31: 10 mg via INTRAVENOUS
  Filled 2023-07-31: qty 2

## 2023-07-31 MED ORDER — AZITHROMYCIN 250 MG PO TABS: ORAL_TABLET | ORAL | 0 refills | Status: DC

## 2023-07-31 MED ORDER — SODIUM CHLORIDE 0.9 % IV BOLUS
1000.0000 mL | Freq: Once | INTRAVENOUS | Status: AC
  Administered 2023-07-31: 1000 mL via INTRAVENOUS

## 2023-07-31 MED ORDER — CEFDINIR 300 MG PO CAPS: 300.0000 mg | ORAL_CAPSULE | Freq: Two times a day (BID) | ORAL | 0 refills | Status: DC

## 2023-07-31 NOTE — ED Provider Notes (Signed)
 [facility] Provider Note    Event Date/Time   First MD Initiated Contact with Patient 07/31/23 1904     (approximate)   History   Chief Complaint: Fever   HPI  Jahmani Staup is a 38 y.o. male with a history of TBI, seizure disorder who was sent to the ED from group home due to fever chills and headache that started this morning.  Patient reports waking up with a migraine headache this morning.  No vomiting or diarrhea.  Denies pain other than headache.  Not thunderclap in onset.          Physical Exam   Triage Vital Signs: ED Triage Vitals  Encounter Vitals Group     BP 07/31/23 1907 125/77     Systolic BP Percentile --      Diastolic BP Percentile --      Pulse Rate 07/31/23 1907 88     Resp 07/31/23 1907 16     Temp 07/31/23 1907 98.7 F (37.1 C)     Temp Source 07/31/23 1907 Oral     SpO2 07/31/23 1907 98 %     Weight --      Height --      Head Circumference --      Peak Flow --      Pain Score 07/31/23 1909 10     Pain Loc --      Pain Education --      Exclude from Growth Chart --     Most recent vital signs: Vitals:   07/31/23 1907 07/31/23 1930  BP: 125/77 101/78  Pulse: 88 87  Resp: 16 (!) 24  Temp: 98.7 F (37.1 C)   SpO2: 98% 98%    General: Awake, no distress.  CV:  Good peripheral perfusion.  Regular rate and rhythm Resp:  Normal effort.  Clear to auscultation bilaterally Abd:  No distention.  Soft nontender Other:  Moist oral mucosa   ED Results / Procedures / Treatments   Labs (all labs ordered are listed, but only abnormal results are displayed) Labs Reviewed  BASIC METABOLIC PANEL WITH GFR - Abnormal; Notable for the following components:      Result Value   Glucose, Bld 102 (*)    All other components within normal limits  CBC WITH DIFFERENTIAL/PLATELET - Abnormal; Notable for the following components:   WBC 14.2 (*)    RBC 4.20 (*)    Neutro Abs 10.6 (*)    Monocytes Absolute 1.7 (*)    All  other components within normal limits  CBG MONITORING, ED - Abnormal; Notable for the following components:   Glucose-Capillary 132 (*)    All other components within normal limits     EKG Interpreted by me Sinus rhythm rate of 93.  Right axis, normal intervals.  Poor R wave progression.  Normal ST segments and T waves.   RADIOLOGY Chest x-ray read by me, shows right middle lobe pneumonia.  Radiology report reviewed   PROCEDURES:  Procedures   MEDICATIONS ORDERED IN ED: Medications  azithromycin (ZITHROMAX) tablet 500 mg (has no administration in time range)  sodium chloride 0.9 % bolus 1,000 mL (1,000 mLs Intravenous New Bag/Given 07/31/23 1953)  metoCLOPramide (REGLAN) injection 10 mg (10 mg Intravenous Given 07/31/23 1950)     IMPRESSION / MDM / ASSESSMENT AND PLAN / ED COURSE  I reviewed the triage vital signs and the nursing notes.  DDx: Viral illness, dehydration, AKI, electrolyte derangement, pneumonia  Patient's  presentation is most consistent with acute presentation with potential threat to life or bodily function.  Patient sent to the ED due to fevers chills and headache.  He is neurologically intact, reassuring exam.  Doubt ICH, aneurysm, stroke.  Vital signs here are normal, not septic.  Will give IV fluids, Reglan, check labs and chest x-ray.   ----------------------------------------- 8:57 PM on 07/31/2023 ----------------------------------------- Remains calm comfortable and stable.  Workup reveals uncomplicated commune acquired pneumonia.  Stable for discharge home, will start on antibiotics.      FINAL CLINICAL IMPRESSION(S) / ED DIAGNOSES   Final diagnoses:  Community acquired pneumonia of right middle lobe of lung     Rx / DC Orders   ED Discharge Orders          Ordered    cefdinir (OMNICEF) 300 MG capsule  2 times daily        07/31/23 2056    azithromycin (ZITHROMAX Z-PAK) 250 MG tablet        07/31/23 2056             Note:   This document was prepared using Dragon voice recognition software and may include unintentional dictation errors.   Sharman Cheek, MD 07/31/23 [phone]

## 2023-07-31 NOTE — Discharge Instructions (Signed)
 Your chest xray shows a small area of pneumonia in the right lung. Please start taking the antibiotics as prescribed (sent electronically to your pharmacy), and follow up with your doctor.

## 2023-07-31 NOTE — ED Notes (Signed)
 Diacharge discussed with group home leader [employer]

## 2023-07-31 NOTE — ED Notes (Signed)
 Pt tells me that he has had a headache today and a temperature of 102.  No fever at this time and fortunately no distress.

## 2023-07-31 NOTE — ED Triage Notes (Signed)
 Pt arrives from group home with paperwork that states that he has fever, chills and headache since this am.

## 2023-08-04 ENCOUNTER — Ambulatory Visit: Payer: Medicare Other | Admitting: Family Medicine

## 2023-08-04 ENCOUNTER — Encounter: Payer: Self-pay | Admitting: Family Medicine

## 2023-08-04 VITALS — BP 103/54 | HR 65 | Temp 98.4°F | Ht 72.0 in | Wt 224.2 lb

## 2023-08-04 DIAGNOSIS — E66811 Obesity, class 1: Secondary | ICD-10-CM

## 2023-08-04 DIAGNOSIS — Z79899 Other long term (current) drug therapy: Secondary | ICD-10-CM

## 2023-08-04 DIAGNOSIS — E039 Hypothyroidism, unspecified: Secondary | ICD-10-CM

## 2023-08-04 DIAGNOSIS — Z23 Encounter for immunization: Secondary | ICD-10-CM

## 2023-08-04 DIAGNOSIS — E559 Vitamin D deficiency, unspecified: Secondary | ICD-10-CM

## 2023-08-04 DIAGNOSIS — Z7689 Persons encountering health services in other specified circumstances: Secondary | ICD-10-CM

## 2023-08-04 DIAGNOSIS — S069X9S Unspecified intracranial injury with loss of consciousness of unspecified duration, sequela: Secondary | ICD-10-CM

## 2023-08-04 DIAGNOSIS — J189 Pneumonia, unspecified organism: Secondary | ICD-10-CM | POA: Diagnosis not present

## 2023-08-04 DIAGNOSIS — Z13228 Encounter for screening for other metabolic disorders: Secondary | ICD-10-CM

## 2023-08-04 DIAGNOSIS — K219 Gastro-esophageal reflux disease without esophagitis: Secondary | ICD-10-CM

## 2023-08-04 DIAGNOSIS — R569 Unspecified convulsions: Secondary | ICD-10-CM

## 2023-08-04 DIAGNOSIS — F39 Unspecified mood [affective] disorder: Secondary | ICD-10-CM

## 2023-08-04 DIAGNOSIS — Z13220 Encounter for screening for lipoid disorders: Secondary | ICD-10-CM

## 2023-08-04 DIAGNOSIS — K5909 Other constipation: Secondary | ICD-10-CM | POA: Insufficient documentation

## 2023-08-04 DIAGNOSIS — H04123 Dry eye syndrome of bilateral lacrimal glands: Secondary | ICD-10-CM

## 2023-08-04 DIAGNOSIS — Z130 Encounter for screening for diseases of the blood and blood-forming organs and certain disorders involving the immune mechanism: Secondary | ICD-10-CM

## 2023-08-04 MED ORDER — DOCUSATE SODIUM 100 MG PO CAPS: 100.0000 mg | ORAL_CAPSULE | Freq: Every day | ORAL | 11 refills | Status: AC

## 2023-08-04 MED ORDER — DOCUSATE SODIUM 100 MG PO CAPS: 100.0000 mg | ORAL_CAPSULE | Freq: Every day | ORAL | 11 refills | Status: DC | PRN

## 2023-08-04 MED ORDER — HYPROMELLOSE (GONIOSCOPIC) 2.5 % OP SOLN: 2.0000 [drp] | Freq: Two times a day (BID) | OPHTHALMIC | 4 refills | Status: AC

## 2023-08-04 MED ORDER — TETANUS-DIPHTH-ACELL PERTUSSIS 5-2.5-18.5 LF-MCG/0.5 IM SUSP: 0.5000 mL | Freq: Once | INTRAMUSCULAR | 0 refills | Status: AC

## 2023-08-04 NOTE — Progress Notes (Signed)
 New patient visit   Patient: Eddie Berry   DOB: 1985-07-28   38 y.o. Male  MRN: [id number] Visit Date: 08/04/2023  Today's healthcare provider: Sherlyn Hay, DO   Chief Complaint  Patient presents with   New Patient (Initial Visit)   Subjective    Eddie Berry is a 38 y.o. male who presents today as a new patient to establish care.   HPI  Caregiver with [employer] Sees behavioral health [facility] - Dr. Marily Lente   Eddie Berry is a 38 year old male with a history of TBI and seizures who presents for follow-up after an ER visit for pneumonia. He is accompanied by Aldean Ast, his caregiver.  He was recently seen in the emergency room for pneumonia and is here for a follow-up to ensure the condition is resolving. He was treated with azithromycin and cefdinir and is currently on the last day of azithromycin. He recalls a previous episode of pneumonia two to three years ago, during which he blacked out. Recently, he experienced unresponsiveness and was taken to the ER, where he regained consciousness in the ambulance after hitting a bump.  He has a history of traumatic brain injury (TBI) which he states is 'basically healed' and has been present for two to five years. He also has a history of seizures, which he attributes to the TBI. He takes lacosamide and Depakote (500 mg in the morning and 250 mg at night) for seizure management. He has not had a seizure in ten years.  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 He takes fenofibrate for cholesterol management and omeprazole for reflux, which causes heartburn if not taken. He uses IsoptoTears for eye dryness, which he feels is excessive at four times a day, and prefers to use it morning and night. He also takes a stool softener, which causes loose stools and prefers to take it once daily as needed for constipation. He takes propranolol, which may be for anxiety, and  paliperidone for mood disorders. He also takes melatonin for sleep, a multivitamin, and vitamin D. He has a low dose of levothyroxine for thyroid disease.  He reports a family history of diabetes in his grandparents. He resides in a group home in [place]. He has been screened for hepatitis C and HIV in the past and reports negative results. He has declined the flu and COVID vaccines historically.  No chest pain, tremors, or hyperthyroidism. Reports loose stools with current stool softener regimen.    Past Medical History:  Diagnosis Date   Allergy    Asthma    Blood transfusion without reported diagnosis    Seizures (HCC)    TBI (traumatic brain injury) (HCC)    Past Surgical History:  Procedure Laterality Date   ANKLE SURGERY     APPENDECTOMY     BRAIN SURGERY     LUNG SURGERY     No family status information on file.   History reviewed. No pertinent family history. Social History   Socioeconomic History   Marital status: Single    Spouse name: Not on file   Number of children: Not on file   Years of education: Not on file   Highest education level: Not on file  Occupational History   Not on file  Tobacco Use   Smoking status: Former    Current packs/day: 0.00    Average packs/day: 1 pack/day for  22.0 years (22.0 ttl pk-yrs)    Types: Cigarettes    Start date: 2000    Quit date: 2022    Years since quitting: 3.2   Smokeless tobacco: Never  Vaping Use   Vaping status: Never Used  Substance and Sexual Activity   Alcohol use: Not Currently   Drug use: Not Currently   Sexual activity: Not on file  Other Topics Concern   Not on file  Social History Narrative   Not on file   Social Drivers of Health   Financial Resource Strain: Not on file  Food Insecurity: No Food Insecurity (02/03/2022)   Hunger Vital Sign    Worried About Running Out of Food in the Last Year: Never true    Ran Out of Food in the Last Year: Never true  Transportation Needs: No  Transportation Needs (02/03/2022)   PRAPARE - Administrator, Civil Service (Medical): No    Lack of Transportation (Non-Medical): No  Physical Activity: Not on file  Stress: Not on file  Social Connections: Not on file   Outpatient Medications Prior to Visit  Medication Sig   acetaminophen (TYLENOL) 500 MG tablet Take 500 mg by mouth every 6 (six) hours as needed.   albuterol (VENTOLIN HFA) 108 (90 Base) MCG/ACT inhaler Inhale 2 puffs into the lungs every 6 (six) hours as needed for wheezing or shortness of breath.   azelastine (ASTELIN) 0.1 % nasal spray Place 2 sprays into both nostrils as needed for rhinitis. Use in each nostril as directed   azithromycin (ZITHROMAX Z-PAK) 250 MG tablet Take 2 tablets (500 mg) on  Day 1,  followed by 1 tablet (250 mg) once daily on Days 2 through 5.   cefdinir (OMNICEF) 300 MG capsule Take 1 capsule (300 mg total) by mouth 2 (two) times daily.   cetirizine (ZYRTEC) 10 MG tablet Take 10 mg by mouth daily.   Cholecalciferol (VITAMIN D3) 50 MCG (2000 UT) TABS Take 1 tablet by mouth daily.   divalproex (DEPAKOTE) 250 MG DR tablet Take 250 mg by mouth at bedtime.   divalproex (DEPAKOTE) 500 MG DR tablet Take 500 mg by mouth in the morning.   fenofibrate (TRICOR) 48 MG tablet Take 48 mg by mouth daily.   fluticasone (FLONASE) 50 MCG/ACT nasal spray Place 2 sprays into both nostrils daily.   lacosamide (VIMPAT) 200 MG TABS tablet Take 1 tablet (200 mg total) by mouth 2 (two) times daily.   lamoTRIgine (LAMICTAL) 100 MG tablet Take 100 mg by mouth 2 (two) times daily.   levothyroxine (SYNTHROID) 25 MCG tablet Take 25 mcg by mouth daily before breakfast.   Melatonin 10 MG TABS Take 1 tablet by mouth at bedtime.   mirabegron ER (MYRBETRIQ) 50 MG TB24 tablet Take 50 mg by mouth daily.   mirtazapine (REMERON) 15 MG tablet Take 15 mg by mouth at bedtime.   Multiple Vitamin (THEREMS PO) Take 1 tablet by mouth daily.   omeprazole (PRILOSEC) 20 MG capsule  Take 20 mg by mouth daily.   paliperidone (INVEGA) 6 MG 24 hr tablet Take 9 mg by mouth daily.   propranolol (INDERAL) 10 MG tablet Take 10 mg by mouth daily.   sertraline (ZOLOFT) 100 MG tablet Take 100 mg by mouth daily.   sertraline (ZOLOFT) 50 MG tablet Take 50 mg by mouth at bedtime.   traZODone (DESYREL) 50 MG tablet Take 50 mg by mouth at bedtime.   [DISCONTINUED] docusate sodium (COLACE) 100 MG capsule  Take 100 mg by mouth 2 (two) times daily.   [DISCONTINUED] hydroxypropyl methylcellulose / hypromellose (ISOPTO TEARS / GONIOVISC) 2.5 % ophthalmic solution Place 2 drops into the right eye 4 (four) times daily.   [DISCONTINUED] clozapine (CLOZARIL) 200 MG tablet Take 75-100 mg by mouth daily. 75mg  qam and 100mg  qpm (Patient not taking: Reported on 08/04/2023)   No facility-administered medications prior to visit.   Allergies  Allergen Reactions   Antihistamines, Diphenhydramine-Type Other (See Comments)    UNK reaction   Nsaids Other (See Comments)    Unk reaction   Prednisone Other (See Comments)    UNK reaction   Aspirin Rash   Penicillins Rash    There is no immunization history for the selected administration types on file for this patient.  Health Maintenance  Topic Date Due   Medicare Annual Wellness (AWV)  Never done   DTaP/Tdap/Td (1 - Tdap) Never done   INFLUENZA VACCINE  08/04/2023 (Originally 12/05/2022)   COVID-19 Vaccine (1) 08/20/2023 (Originally 08/07/1988)   Hepatitis C Screening  08/03/2024 (Originally 08/07/2001)   HIV Screening  Completed   HPV VACCINES  Aged Out    Patient Care Team: Benaiah Behan, Monico Blitz, DO as PCP - General (Family Medicine)  Review of Systems  Constitutional:  Negative for chills and fever.  Respiratory:  Positive for cough. Negative for shortness of breath and wheezing.   Cardiovascular:  Negative for chest pain, palpitations and leg swelling.  Neurological:  Negative for seizures (no recent), weakness and headaches.         Objective    BP (!) 103/54   Pulse 65   Temp 98.4 F (36.9 C) (Oral)   Ht 6' (1.829 m)   Wt 224 lb 3.2 oz (101.7 kg)   SpO2 97%   BMI 30.41 kg/m     Physical Exam Vitals reviewed.  Constitutional:      General: He is not in acute distress.    Appearance: Normal appearance. He is not diaphoretic.  HENT:     Head: Normocephalic and atraumatic.  Eyes:     General: No scleral icterus.    Conjunctiva/sclera: Conjunctivae normal.  Cardiovascular:     Rate and Rhythm: Normal rate and regular rhythm.     Pulses: Normal pulses.     Heart sounds: Normal heart sounds. No murmur heard. Pulmonary:     Effort: Pulmonary effort is normal. No respiratory distress.     Breath sounds: Normal breath sounds. No wheezing or rhonchi.  Musculoskeletal:     Cervical back: Neck supple.     Right lower leg: No edema.     Left lower leg: No edema.  Lymphadenopathy:     Cervical: No cervical adenopathy.  Skin:    General: Skin is warm and dry.     Findings: No rash.  Neurological:     Mental Status: He is alert and oriented to person, place, and time. Mental status is at baseline.  Psychiatric:        Mood and Affect: Mood normal.        Behavior: Behavior normal.     Depression Screen    08/04/2023   11:19 AM  PHQ 2/9 Scores  PHQ - 2 Score 0  PHQ- 9 Score 1   No results found for any visits on 08/04/23.  Assessment & Plan     Establishing care with new doctor, encounter for  Community acquired pneumonia of right lower lobe of lung  Seizure (HCC) -  Lamotrigine level -     Valproic acid level  Traumatic brain injury with loss of consciousness, sequela (HCC)  Hypothyroidism, unspecified type -     TSH Rfx on Abnormal to Free T4  Gastroesophageal reflux disease, unspecified whether esophagitis present  Mood disorder (HCC)  Chronic dryness of both eyes -     Hypromellose (Gonioscopic); Place 2 drops into both eyes 2 (two) times daily.  Dispense: 15 mL; Refill:  4  Chronic constipation -     Docusate Sodium; Take 1 capsule (100 mg total) by mouth daily.  Dispense: 30 capsule; Refill: 11 -     Docusate Sodium; Take 1 capsule (100 mg total) by mouth daily as needed for mild constipation.  Dispense: 30 capsule; Refill: 11  Vitamin D deficiency -     VITAMIN D 25 Hydroxy (Vit-D Deficiency, Fractures)  Obesity (BMI 30.0-34.9)  High risk medication use -     Lamotrigine level -     Valproic acid level  Screening for endocrine, metabolic and immunity disorder -     Comprehensive metabolic panel with GFR  Screening for lipid disorders -     Lipid panel  Need for Tdap vaccination -     Tetanus-Diphth-Acell Pertussis; Inject 0.5 mLs into the muscle once for 1 dose.  Dispense: 0.5 mL; Refill: 0    Establishing care with a new doctor, encounter for  Pneumonia Recently treated in the ER with azithromycin and cefdinir. Following-up today to ensure resolution. Experienced unresponsiveness before he was diagnosed, likely related to the infection. - Ensure completion of azithromycin and cefdinir course  Seizure Disorder Managed with lacosamide and Depakote. No recent seizures reported. Potentially related to TBI. - Continue lacosamide, lamotrigine and Depakote - Check Depakote and lamotrigine levels  Traumatic Brain Injury (TBI) Follows with neurology, Dr. Malvin Johns, but is overdue for follow-up. Seizures may be related.  Hypothyroidism Managed with levothyroxine. No current issues reported. - Continue levothyroxine  Gastroesophageal Reflux Disease (GERD) Managed with omeprazole. Experiences heartburn if not taken. - Continue omeprazole  Mood Disorder Managed with paliperidone and lamotrigine. Follow-up with behavioral health provider, Dr. Marily Lente. - Continue paliperidone and lamotrigine - Follow-up with Dr. Marily Lente  Dry Eye Syndrome Using IsoptoTears for dryness. Reports excessive dryness with current dosing schedule. - Adjust  IsoptoTears to twice daily (morning and night)  Constipation Using stool softener, but reports loose stools with current dosing. Adjustments needed. - Adjust stool softener to once daily in the morning, with additional dose as needed  Obesity (BMI 30.0-34.9) Counseled on diet and exercise.  General Health Maintenance Tdap vaccine recommended but not administered due to Medicare restrictions. Declined flu and COVID vaccines. - Recommend Tdap vaccine at pharmacy or group home - Flu vaccine and COVID booster declined   Follow-up Follow-up plans discussed for various conditions and specialists. - Schedule follow-up with neurologist - Schedule Medicare annual wellness visit - Follow-up with Dr. Marily Lente for mood disorder management   Return in about 3 months (around 11/03/2023) for mAWV with AWV nurse if available, then annually for chronic f/u.     I discussed the assessment and treatment plan with the patient  The patient was provided an opportunity to ask questions and all were answered. The patient agreed with the plan and demonstrated an understanding of the instructions.   The patient was advised to call back or seek an in-person evaluation if the symptoms worsen or if the condition fails to improve as anticipated.    Bemnet Trovato N  Payton Mccallum, DO  [facility] [phone] (phone) [phone] (fax)  [facility]

## 2023-08-05 ENCOUNTER — Telehealth: Payer: Self-pay | Admitting: Family Medicine

## 2023-08-05 ENCOUNTER — Telehealth: Payer: Self-pay

## 2023-08-05 NOTE — Telephone Encounter (Signed)
 Copied from CRM [id number]. Topic: Clinical - Prescription Issue >> Aug 05, 2023  8:14 AM Geroge Baseman wrote: Reason for CRM: Patients prescription needs to show that he is discontinuing at night, artificial tears needs to be discontinued from the pharmacy as well. Tetanus shot was delivered to the patient, patient has it at home and they said the nurse cannot give it to him so they need to bring it up to have it administered. Call back [phone] - Aldean Ast

## 2023-08-05 NOTE — Telephone Encounter (Signed)
 Eula Listen, caregiver, called and stated that Dr. Payton Mccallum needs to contact [facility] to discontinue the 4 times a day eye drop and discontinue the stool softner order at 8:00 p.m.   This is causing her to be in a "medication error" due to Dr. Payton Mccallum changing his meds to similar items as these that need to be d/c.  Caryl Asp stated this needs to be done today.

## 2023-08-06 ENCOUNTER — Telehealth: Payer: Self-pay | Admitting: Family Medicine

## 2023-08-06 NOTE — Telephone Encounter (Signed)
 Opened in error

## 2023-08-06 NOTE — Telephone Encounter (Signed)
 Copied from CRM [id number]. Topic: Clinical - Prescription Issue >> Aug 06, 2023  9:19 AM Marlow Baars wrote: The caregiver called back once again to make sure message was received to DC meds mentioned for eye drops so he can get other ones. She says this is very time sensitive. Please assist as soon as possible. Please call [phone]

## 2023-08-06 NOTE — Telephone Encounter (Signed)
 Copied from CRM [id number]. Topic: General - Call Back - No Documentation >> Aug 05, 2023  2:21 PM Higinio Roger wrote: Reason for CRM: Patient's caregiver, Aldean Ast is requesting a callback to speak with Jacquenette Shone about patient's tetanus shot being administered.  Callback #: [phone]

## 2023-08-06 NOTE — Telephone Encounter (Signed)
 E2C2 called ---- 4th Request from patient's caregiver to discuss medications. Refer to previous messages from 08/05/2023. Please follow up as soon as possible, please.

## 2023-08-11 ENCOUNTER — Ambulatory Visit: Admitting: Family Medicine

## 2023-08-11 NOTE — Telephone Encounter (Signed)
This was sent in to the pharmacy 

## 2023-08-20 ENCOUNTER — Other Ambulatory Visit: Payer: Self-pay

## 2023-08-20 ENCOUNTER — Encounter: Payer: Self-pay | Admitting: *Deleted

## 2023-08-20 ENCOUNTER — Emergency Department
Admission: EM | Admit: 2023-08-20 | Discharge: 2023-08-21 | Disposition: A | Discharge status: Home / Self Care | Attending: Emergency Medicine | Admitting: Emergency Medicine

## 2023-08-20 DIAGNOSIS — F32A Depression, unspecified: Secondary | ICD-10-CM | POA: Diagnosis present

## 2023-08-20 DIAGNOSIS — Z6222 Institutional upbringing: Secondary | ICD-10-CM | POA: Insufficient documentation

## 2023-08-20 DIAGNOSIS — J45909 Unspecified asthma, uncomplicated: Secondary | ICD-10-CM | POA: Diagnosis not present

## 2023-08-20 DIAGNOSIS — F39 Unspecified mood [affective] disorder: Secondary | ICD-10-CM | POA: Diagnosis not present

## 2023-08-20 DIAGNOSIS — F02818 Dementia in other diseases classified elsewhere, unspecified severity, with other behavioral disturbance: Secondary | ICD-10-CM

## 2023-08-20 DIAGNOSIS — Z87891 Personal history of nicotine dependence: Secondary | ICD-10-CM | POA: Diagnosis not present

## 2023-08-20 DIAGNOSIS — Z593 Problems related to living in residential institution: Secondary | ICD-10-CM | POA: Diagnosis not present

## 2023-08-20 DIAGNOSIS — G40909 Epilepsy, unspecified, not intractable, without status epilepticus: Secondary | ICD-10-CM | POA: Insufficient documentation

## 2023-08-20 DIAGNOSIS — S069X9S Unspecified intracranial injury with loss of consciousness of unspecified duration, sequela: Secondary | ICD-10-CM

## 2023-08-20 DIAGNOSIS — R45851 Suicidal ideations: Secondary | ICD-10-CM | POA: Insufficient documentation

## 2023-08-20 DIAGNOSIS — Z789 Other specified health status: Secondary | ICD-10-CM

## 2023-08-20 DIAGNOSIS — Z87820 Personal history of traumatic brain injury: Secondary | ICD-10-CM | POA: Diagnosis not present

## 2023-08-20 LAB — COMPREHENSIVE METABOLIC PANEL WITH GFR
ALT: 19 U/L (ref 0–44)
AST: 23 U/L (ref 15–41)
Albumin: 4.2 g/dL (ref 3.5–5.0)
Alkaline Phosphatase: 42 U/L (ref 38–126)
Anion gap: 9 (ref 5–15)
BUN: 14 mg/dL (ref 6–20)
CO2: 23 mmol/L (ref 22–32)
Calcium: 9.1 mg/dL (ref 8.9–10.3)
Chloride: 103 mmol/L (ref 98–111)
Creatinine, Ser: 0.91 mg/dL (ref 0.61–1.24)
GFR, Estimated: >60 mL/min (ref >60)
Glucose, Bld: 133 mg/dL — ABNORMAL HIGH (ref 70–99)
Potassium: 3.5 mmol/L (ref 3.5–5.1)
Sodium: 135 mmol/L (ref 135–145)
Total Bilirubin: 0.5 mg/dL (ref 0.0–1.2)
Total Protein: 7.5 g/dL (ref 6.5–8.1)

## 2023-08-20 LAB — URINE DRUG SCREEN, QUALITATIVE (ARMC ONLY)
Amphetamines, Ur Screen: NOT DETECTED
Barbiturates, Ur Screen: NOT DETECTED
Benzodiazepine, Ur Scrn: NOT DETECTED
Cannabinoid 50 Ng, Ur ~~LOC~~: NOT DETECTED
Cocaine Metabolite,Ur ~~LOC~~: NOT DETECTED
MDMA (Ecstasy)Ur Screen: NOT DETECTED
Methadone Scn, Ur: NOT DETECTED
Opiate, Ur Screen: NOT DETECTED
Phencyclidine (PCP) Ur S: NOT DETECTED
Tricyclic, Ur Screen: NOT DETECTED

## 2023-08-20 LAB — ACETAMINOPHEN LEVEL: Acetaminophen (Tylenol), Serum: <10 ug/mL — ABNORMAL LOW (ref 10–30)

## 2023-08-20 LAB — CBC
HCT: 37.5 % — ABNORMAL LOW (ref 39.0–52.0)
Hemoglobin: 12.4 g/dL — ABNORMAL LOW (ref 13.0–17.0)
MCH: 31 pg (ref 26.0–34.0)
MCHC: 33.1 g/dL (ref 30.0–36.0)
MCV: 93.8 fL (ref 80.0–100.0)
Platelets: 280 10*3/uL (ref 150–400)
RBC: 4 MIL/uL — ABNORMAL LOW (ref 4.22–5.81)
RDW: 12 % (ref 11.5–15.5)
WBC: 7 10*3/uL (ref 4.0–10.5)
nRBC: 0 % (ref 0.0–0.2)

## 2023-08-20 LAB — SALICYLATE LEVEL: Salicylate Lvl: <7 mg/dL — ABNORMAL LOW (ref 7.0–30.0)

## 2023-08-20 LAB — ETHANOL: Alcohol, Ethyl (B): <10 mg/dL (ref <10)

## 2023-08-20 NOTE — ED Notes (Signed)
 White sneakers Beaded necklace 2 beaded [employer] fast streamer Black watch White socks Gray pants [employer]
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
## 2023-08-20 NOTE — ED Provider Notes (Signed)
 [facility] Provider Note    Event Date/Time   First MD Initiated Contact with Patient 08/20/23 2039     (approximate)   History   Psychiatric Evaluation   HPI  Eddie Berry is a 38 y.o. male who has a history of TBI who resides in a group home who comes in voluntary due to SI.  Patient reportedly asked the officer for his gun.  He states that he could not do it anymore.  Patient to me does report SI but does not really have a plan.  He states that he mostly just wants to get out of his group home.  He states he would never hrut himself. Denies any HI.    Physical Exam   Triage Vital Signs: ED Triage Vitals  Encounter Vitals Group     BP 08/20/23 2020 (!) 127/91     Systolic BP Percentile --      Diastolic BP Percentile --      Pulse Rate 08/20/23 2020 93     Resp 08/20/23 2020 18     Temp 08/20/23 2020 98.5 F (36.9 C)     Temp Source 08/20/23 2020 Oral     SpO2 08/20/23 2020 98 %     Weight 08/20/23 2013 224 lb (101.6 kg)     Height 08/20/23 2013 6' (1.829 m)     Head Circumference --      Peak Flow --      Pain Score 08/20/23 2013 0     Pain Loc --      Pain Education --      Exclude from Growth Chart --     Most recent vital signs: Vitals:   08/20/23 2020  BP: (!) 127/91  Pulse: 93  Resp: 18  Temp: 98.5 F (36.9 C)  SpO2: 98%     General: Awake, no distress.  CV:  Good peripheral perfusion.  Resp:  Normal effort.  Abd:  No distention.  Other:  Patient reports SI but no HI.   ED Results / Procedures / Treatments   Labs (all labs ordered are listed, but only abnormal results are displayed) Labs Reviewed  CBC - Abnormal; Notable for the following components:      Result Value   RBC 4.00 (*)    Hemoglobin 12.4 (*)    HCT 37.5 (*)    All other components within normal limits  COMPREHENSIVE METABOLIC PANEL WITH GFR  ETHANOL  SALICYLATE LEVEL  ACETAMINOPHEN LEVEL  URINE DRUG SCREEN, QUALITATIVE ([facility] ONLY)    PROCEDURES:  Critical Care performed: No  Procedures   MEDICATIONS ORDERED IN ED: Medications - No data to display   IMPRESSION / MDM / ASSESSMENT AND PLAN / ED COURSE  I reviewed the triage vital signs and the nursing notes.   Patient's presentation is most consistent with acute presentation with potential threat to life or bodily function.   Patient comes in for SI related to being at his group home.  Do not see that he is actually been here before for SI, depression.  However I do feel that this is more likely situational in nature he has no active plan.  We discussed allowing him to stay for a few hours to be seen by psychiatry but once they cleared him that he would have to return to the group home as we do not have a way to have him go elsewhere.  He expressed understanding.  9:36 PM patient now requesting to  be discharged home to group home.  He is adamant that he is not having any SI right now and denies any  10:09 PM, nurse discussed with group home owner and they had no concerns about patient going back to the group home that they have no way to transport patient until tomorrow morning therefore patient can stay here for evaluation by the psychiatry team prior to going back.  Pt is without any acute medical complaints. No exam findings to suggest medical cause of current presentation. Will order psychiatric screening labs and discuss further w/ psychiatric service.  D/d includes but is not limited to psychiatric disease, behavioral/personality disorder, inadequate socioeconomic support, medical.  Based on HPI, exam, unremarkable labs, no concern for acute medical problem at this time. No rigidity, clonus, hyperthermia, focal neurologic deficit, diaphoresis, tachycardia, meningismus, ataxia, gait abnormality or other finding to suggest this visit represents a non-psychiatric problem. Screening labs reviewed.    Given this, pt medically cleared, to be dispositioned per  Psych.    The patient has been placed in psychiatric observation due to the need to provide a safe environment for the patient while obtaining psychiatric consultation and evaluation, as well as ongoing medical and medication management to treat the patient's condition.  The patient has not been placed under full IVC at this time.     FINAL CLINICAL IMPRESSION(S) / ED DIAGNOSES   Final diagnoses:  Lives in group home  Depression, unspecified depression type     Rx / DC Orders   ED Discharge Orders     None        Note:  This document was prepared using Dragon voice recognition software and may include unintentional dictation errors.   Lubertha Rush, MD 08/20/23 [phone]

## 2023-08-20 NOTE — ED Notes (Signed)
 Pt ate food tray and had drink. Now repeatedly stating that he is ready and wants to go back home.

## 2023-08-20 NOTE — BH Assessment (Signed)
 Writer attempted to contact pt's legal guardian [name] [phone]); however, there was no answer. A HIPAA compliant voicemail was left requesting a call back.

## 2023-08-20 NOTE — ED Notes (Signed)
 Attempted to contact [facility] ([phone]) at this time. No answer.

## 2023-08-20 NOTE — ED Triage Notes (Addendum)
 Pt brought in by [employer] officer from  [facility].  Pt reports SI.  [employer] officer states pt was asking officer for his gun.  Pt states I can't do it any more.  Pt reports he doesn't want to go back to the group home  pt is Vol.   Hx TBI

## 2023-08-20 NOTE — ED Notes (Signed)
 Spoke to owner of [facility] [phone]). He reported pt may return to the home in the morning when staff is available. Provider aware.

## 2023-08-21 ENCOUNTER — Other Ambulatory Visit: Payer: Self-pay

## 2023-08-21 ENCOUNTER — Observation Stay
Admission: EM | Admit: 2023-08-21 | Discharge: 2023-08-26 | Disposition: A | Discharge status: Home / Self Care | Attending: Emergency Medicine | Admitting: Emergency Medicine

## 2023-08-21 DIAGNOSIS — F17210 Nicotine dependence, cigarettes, uncomplicated: Secondary | ICD-10-CM | POA: Insufficient documentation

## 2023-08-21 DIAGNOSIS — F39 Unspecified mood [affective] disorder: Secondary | ICD-10-CM | POA: Diagnosis not present

## 2023-08-21 DIAGNOSIS — R4182 Altered mental status, unspecified: Secondary | ICD-10-CM | POA: Diagnosis not present

## 2023-08-21 DIAGNOSIS — J45901 Unspecified asthma with (acute) exacerbation: Secondary | ICD-10-CM | POA: Insufficient documentation

## 2023-08-21 DIAGNOSIS — R29818 Other symptoms and signs involving the nervous system: Principal | ICD-10-CM | POA: Insufficient documentation

## 2023-08-21 DIAGNOSIS — Z79899 Other long term (current) drug therapy: Secondary | ICD-10-CM | POA: Insufficient documentation

## 2023-08-21 DIAGNOSIS — R569 Unspecified convulsions: Secondary | ICD-10-CM

## 2023-08-21 DIAGNOSIS — S069X9S Unspecified intracranial injury with loss of consciousness of unspecified duration, sequela: Secondary | ICD-10-CM | POA: Diagnosis not present

## 2023-08-21 DIAGNOSIS — Z87891 Personal history of nicotine dependence: Secondary | ICD-10-CM | POA: Diagnosis not present

## 2023-08-21 DIAGNOSIS — R4689 Other symptoms and signs involving appearance and behavior: Principal | ICD-10-CM

## 2023-08-21 DIAGNOSIS — F02818 Dementia in other diseases classified elsewhere, unspecified severity, with other behavioral disturbance: Secondary | ICD-10-CM | POA: Diagnosis not present

## 2023-08-21 DIAGNOSIS — R456 Violent behavior: Secondary | ICD-10-CM | POA: Diagnosis present

## 2023-08-21 LAB — CBC
HCT: 38.9 % — ABNORMAL LOW (ref 39.0–52.0)
Hemoglobin: 13.1 g/dL (ref 13.0–17.0)
MCH: 32 pg (ref 26.0–34.0)
MCHC: 33.7 g/dL (ref 30.0–36.0)
MCV: 94.9 fL (ref 80.0–100.0)
Platelets: 294 10*3/uL (ref 150–400)
RBC: 4.1 MIL/uL — ABNORMAL LOW (ref 4.22–5.81)
RDW: 12 % (ref 11.5–15.5)
WBC: 8.2 10*3/uL (ref 4.0–10.5)
nRBC: 0 % (ref 0.0–0.2)

## 2023-08-21 LAB — CBG MONITORING, ED: Glucose-Capillary: 92 mg/dL (ref 70–99)

## 2023-08-21 LAB — ETHANOL: Alcohol, Ethyl (B): <10 mg/dL (ref <10)

## 2023-08-21 LAB — COMPREHENSIVE METABOLIC PANEL WITH GFR
ALT: 21 U/L (ref 0–44)
AST: 26 U/L (ref 15–41)
Albumin: 4.5 g/dL (ref 3.5–5.0)
Alkaline Phosphatase: 43 U/L (ref 38–126)
Anion gap: 9 (ref 5–15)
BUN: 14 mg/dL (ref 6–20)
CO2: 24 mmol/L (ref 22–32)
Calcium: 9.5 mg/dL (ref 8.9–10.3)
Chloride: 105 mmol/L (ref 98–111)
Creatinine, Ser: 1.02 mg/dL (ref 0.61–1.24)
GFR, Estimated: >60 mL/min (ref >60)
Glucose, Bld: 138 mg/dL — ABNORMAL HIGH (ref 70–99)
Potassium: 3.7 mmol/L (ref 3.5–5.1)
Sodium: 138 mmol/L (ref 135–145)
Total Bilirubin: 0.9 mg/dL (ref 0.0–1.2)
Total Protein: 7.6 g/dL (ref 6.5–8.1)

## 2023-08-21 LAB — URINE DRUG SCREEN, QUALITATIVE (ARMC ONLY)
Amphetamines, Ur Screen: NOT DETECTED
Barbiturates, Ur Screen: NOT DETECTED
Benzodiazepine, Ur Scrn: NOT DETECTED
Cannabinoid 50 Ng, Ur ~~LOC~~: NOT DETECTED
Cocaine Metabolite,Ur ~~LOC~~: NOT DETECTED
MDMA (Ecstasy)Ur Screen: NOT DETECTED
Methadone Scn, Ur: NOT DETECTED
Opiate, Ur Screen: NOT DETECTED
Phencyclidine (PCP) Ur S: NOT DETECTED
Tricyclic, Ur Screen: NOT DETECTED

## 2023-08-21 LAB — SALICYLATE LEVEL: Salicylate Lvl: <7 mg/dL — ABNORMAL LOW (ref 7.0–30.0)

## 2023-08-21 LAB — ACETAMINOPHEN LEVEL: Acetaminophen (Tylenol), Serum: <10 ug/mL — ABNORMAL LOW (ref 10–30)

## 2023-08-21 MED ORDER — LORAZEPAM 2 MG/ML IJ SOLN
2.0000 mg | Freq: Once | INTRAMUSCULAR | Status: AC
  Administered 2023-08-21: 2 mg via INTRAVENOUS

## 2023-08-21 MED ORDER — DIVALPROEX SODIUM 500 MG PO DR TAB
500.0000 mg | DELAYED_RELEASE_TABLET | Freq: Every day | ORAL | Status: DC
  Administered 2023-08-22 – 2023-08-26 (×5): 500 mg via ORAL
  Filled 2023-08-21 (×5): qty 1

## 2023-08-21 MED ORDER — DIVALPROEX SODIUM 250 MG PO DR TAB
250.0000 mg | DELAYED_RELEASE_TABLET | Freq: Every day | ORAL | Status: DC
  Administered 2023-08-22 – 2023-08-25 (×4): 250 mg via ORAL
  Filled 2023-08-21 (×5): qty 1

## 2023-08-21 MED ORDER — PANTOPRAZOLE SODIUM 40 MG PO TBEC
40.0000 mg | DELAYED_RELEASE_TABLET | Freq: Every day | ORAL | Status: DC
  Administered 2023-08-22 – 2023-08-26 (×5): 40 mg via ORAL
  Filled 2023-08-21 (×5): qty 1

## 2023-08-21 MED ORDER — LACOSAMIDE 50 MG PO TABS
200.0000 mg | ORAL_TABLET | Freq: Two times a day (BID) | ORAL | Status: DC
  Administered 2023-08-22 – 2023-08-26 (×9): 200 mg via ORAL
  Filled 2023-08-21 (×10): qty 4

## 2023-08-21 MED ORDER — LORAZEPAM 2 MG/ML IJ SOLN
2.0000 mg | Freq: Once | INTRAMUSCULAR | Status: DC
  Filled 2023-08-21: qty 1

## 2023-08-21 MED ORDER — LORAZEPAM 2 MG/ML IJ SOLN
2.0000 mg | Freq: Once | INTRAMUSCULAR | Status: AC
  Administered 2023-08-21: 2 mg via INTRAVENOUS
  Filled 2023-08-21: qty 1

## 2023-08-21 MED ORDER — MIRABEGRON ER 50 MG PO TB24
50.0000 mg | ORAL_TABLET | Freq: Every day | ORAL | Status: DC
  Administered 2023-08-22 – 2023-08-26 (×5): 50 mg via ORAL
  Filled 2023-08-21 (×5): qty 1

## 2023-08-21 MED ORDER — FENOFIBRATE 54 MG PO TABS
54.0000 mg | ORAL_TABLET | Freq: Every day | ORAL | Status: DC
  Administered 2023-08-22 – 2023-08-26 (×5): 54 mg via ORAL
  Filled 2023-08-21 (×5): qty 1

## 2023-08-21 MED ORDER — PALIPERIDONE ER 3 MG PO TB24
9.0000 mg | ORAL_TABLET | Freq: Every day | ORAL | Status: DC
  Administered 2023-08-22 – 2023-08-26 (×5): 9 mg via ORAL
  Filled 2023-08-21 (×5): qty 3

## 2023-08-21 MED ORDER — SERTRALINE HCL 50 MG PO TABS
100.0000 mg | ORAL_TABLET | Freq: Every day | ORAL | Status: DC
  Administered 2023-08-22 – 2023-08-26 (×5): 100 mg via ORAL
  Filled 2023-08-21 (×6): qty 2

## 2023-08-21 NOTE — ED Notes (Signed)
 Pt provided with dinner tray. Pt didn't want what they gave him.

## 2023-08-21 NOTE — ED Triage Notes (Signed)
 Pt here from group home for aggressive behavior. Pt has schizophrenia and staff says he is not taking his medications as prescribed. He has had incidents where he has been aggressive and thrown objects and kicked doors. Per staff, he has made threats to harm staff and other residents.

## 2023-08-21 NOTE — BH Assessment (Signed)
 Comprehensive Clinical Assessment (CCA) Note  08/21/2023 Eddie Berry [id number]  Chief Complaint: Patient is a 38 year old male presenting to [facility] ED under IVC. Per triage note Pt here from group home for aggressive behavior. Pt has schizophrenia and staff says he is not taking his medications as prescribed. He has had incidents where he has been aggressive and thrown objects and kicked doors. Per staff, he has made threats to harm staff and other residents. During assessment patient appears alert and oriented x4, calm and cooperative. Patient reports "I got kicked out of the group home, they said that I can't come back, I want to go to a [facility] home." Patient recently presented to this ED on 08/20/23 and was discharged this morning. At that time patient was presenting with SI as a result of not wanting to return back to his group home, he was assessed and psyc cleared last night and was then discharged this morning. Patient reports that he has communicated his need to want a new group home to his legal guardian. Patient's group home director reports that the patient has been off of his Klonopin for 1 month now and has had more aggressive behavior for the past week. Patient denies current SI/HI, when asked about AH he reports hearing voices from his tablet. Patient denies VH. It is difficult to understand the patient at times as he has a speech impediment and some cognitive delay. Chief Complaint  Patient presents with   Aggressive Behavior   Visit Diagnosis: Mood Disorder, Cognitive delay    CCA Screening, Triage and Referral (STR)  Patient Reported Information How did you hear about [place]? Legal System  Referral name: No data recorded Referral phone number: No data recorded  Whom do you see for routine medical problems? No data recorded Practice/Facility Name: No data recorded Practice/Facility Phone Number: No data recorded Name of Contact: No data recorded Contact Number: No data  recorded Contact Fax Number: No data recorded Prescriber Name: No data recorded Prescriber Address (if known): No data recorded  What Is the Reason for Your Visit/Call Today? Pt here from group home for aggressive behavior. Pt has schizophrenia and staff says he is not taking his medications as prescribed. He has had incidents where he has been aggressive and thrown objects and kicked doors. Per staff, he has made threats to harm staff and other residents.  How Long Has This Been Causing You Problems? > than 6 months  What Do You Feel Would Help You the Most Today? Stress Management; Housing Assistance   Have You Recently Been in Any Inpatient Treatment (Hospital/Detox/Crisis Center/28-Day Program)? No data recorded Name/Location of Program/Hospital:No data recorded How Long Were You There? No data recorded When Were You Discharged? No data recorded  Have You Ever Received Services From [facility] Before? No data recorded Who Do You See at [facility]? No data recorded  Have You Recently Had Any Thoughts About Hurting Yourself? No  Are You Planning to Commit Suicide/Harm Yourself At This time? No   Have you Recently Had Thoughts About Hurting Someone Karolee Ohs? No  Explanation: Pt endorsed having passive SI, explaining that he feels that he needs to sacrifice himself as Christ did. Pt alluded to trying to starve himself; but later denied intent.   Have You Used Any Alcohol or Drugs in the Past 24 Hours? No  How Long Ago Did You Use Drugs or Alcohol? No data recorded What Did You Use and How Much? No data recorded  Do  You Currently Have a Therapist/Psychiatrist? Yes  Name of Therapist/Psychiatrist: No data recorded  Have You Been Recently Discharged From Any Office Practice or Programs? No  Explanation of Discharge From Practice/Program: No data recorded    CCA Screening Triage Referral Assessment Type of Contact: Face-to-Face  Is this Initial or Reassessment? No data  recorded Date Telepsych consult ordered in [facility]:  No data recorded Time Telepsych consult ordered in [facility]:  No data recorded  Patient Reported Information Reviewed? No data recorded Patient Left Without Being Seen? No data recorded Reason for Not Completing Assessment: No data recorded  Collateral Involvement: None   Does Patient Have a Court Appointed Legal Guardian? No data recorded Name and Contact of Legal Guardian: No data recorded If Minor and Not Living with Parent(s), Who has Custody? n/a  Is CPS involved or ever been involved? Never  Is APS involved or ever been involved? Never   Patient Determined To Be At Risk for Harm To Self or Others Based on Review of Patient Reported Information or Presenting Complaint? No  Method: Plan without intent (Pt alluded to trying to starve himself; but later denied intent.)  Availability of Means: No access or NA  Intent: Vague intent or NA  Notification Required: No need or identified person  Additional Information for Danger to Others Potential: -- (n/a)  Additional Comments for Danger to Others Potential: n/a  Are There Guns or Other Weapons in Your Home? No  Types of Guns/Weapons: n/a  Are These Weapons Safely Secured?                            Yes  Who Could Verify You Are Able To Have These Secured: Pt lives in a group home setting.  Do You Have any Outstanding Charges, Pending Court Dates, Parole/Probation? none reported  Contacted To Inform of Risk of Harm To Self or Others: Other: Comment   Location of Assessment: [facility] ED   Does Patient Present under Involuntary Commitment? Yes  IVC Papers Initial File Date: No data recorded  [place] of Residence: [place]   Patient Currently Receiving the Following Services: Group Home; Medication Management   Determination of Need: Emergent (2 hours)   Options For Referral: ED Visit     CCA Biopsychosocial Intake/Chief Complaint:  No data recorded Current  Symptoms/Problems: No data recorded  Patient Reported Schizophrenia/Schizoaffective Diagnosis in Past: Yes   Strengths: Patient is able to communicate his needs  Preferences: No data recorded Abilities: No data recorded  Type of Services Patient Feels are Needed: No data recorded  Initial Clinical Notes/Concerns: No data recorded  Mental Health Symptoms Depression:  None   Duration of Depressive symptoms: No data recorded  Mania:  None   Anxiety:   Difficulty concentrating; Irritability; Restlessness; Worrying   Psychosis:  Delusions; Hallucinations   Duration of Psychotic symptoms: Greater than six months   Trauma:  None   Obsessions:  None   Compulsions:  Absent insight/delusional; Poor Insight; Repeated behaviors/mental acts; "Driven" to perform behaviors/acts   Inattention:  None   Hyperactivity/Impulsivity:  None   Oppositional/Defiant Behaviors:  Argumentative; Easily annoyed; Spiteful; Temper   Emotional Irregularity:  Transient, stress-related paranoia/disassociation; Intense/inappropriate anger; Mood lability   Other Mood/Personality Symptoms:  No data recorded   Mental Status Exam Appearance and self-care  Stature:  Average   Weight:  Overweight   Clothing:  Casual   Grooming:  Normal   Cosmetic use:  None  Posture/gait:  Normal   Motor activity:  Not Remarkable   Sensorium  Attention:  Normal   Concentration:  Anxiety interferes; Focuses on irrelevancies   Orientation:  X5   Recall/memory:  Normal   Affect and Mood  Affect:  Appropriate   Mood:  Anxious   Relating  Eye contact:  Normal   Facial expression:  Responsive   Attitude toward examiner:  Cooperative   Thought and Language  Speech flow: Slurred   Thought content:  Appropriate to Mood and Circumstances   Preoccupation:  None   Hallucinations:  Auditory   Organization:  No data recorded  [employer] of Knowledge:  Fair   Intelligence:   Average   Abstraction:  Functional   Judgement:  Poor   Reality Testing:  Realistic   Insight:  Lacking; Poor   Decision Making:  Impulsive   Social Functioning  Social Maturity:  Impulsive   Social Judgement:  Heedless   Stress  Stressors:  Housing   Coping Ability:  Exhausted   Skill Deficits:  Intellect/education; Scientist, physiological; Communication   Supports:  Friends/Service system     Religion: Religion/Spirituality Are You A Religious Person?: No  Leisure/Recreation: Leisure / Recreation Do You Have Hobbies?: No  Exercise/Diet: Exercise/Diet Do You Exercise?: No Have You Gained or Lost A Significant Amount of Weight in the Past Six Months?: No Do You Follow a Special Diet?: No Do You Have Any Trouble Sleeping?: No   CCA Employment/Education Employment/Work Situation: Employment / Work Systems developer: On disability Why is Patient on Disability: Mental Health How Long has Patient Been on Disability: Unknown Has Patient ever Been in the [employer]?: No  Education: Education Is Patient Currently Attending School?: No Did You Have An Individualized Education Program (IIEP): No Did You Have Any Difficulty At School?: No Patient's Education Has Been Impacted by Current Illness: No   CCA Family/Childhood History Family and Relationship History: Family history Marital status: Single Does patient have children?: No  Childhood History:  Childhood History Did patient suffer any verbal/emotional/physical/sexual abuse as a child?: No Did patient suffer from severe childhood neglect?: No Has patient ever been sexually abused/assaulted/raped as an adolescent or adult?: No Was the patient ever a victim of a crime or a disaster?: No Witnessed domestic violence?: No Has patient been affected by domestic violence as an adult?: No  Child/Adolescent Assessment:     CCA Substance Use Alcohol/Drug Use: Alcohol / Drug Use Pain Medications: see  mar Prescriptions: see mar Over the Counter: see mar History of alcohol / drug use?: No history of alcohol / drug abuse                         ASAM's:  Six Dimensions of Multidimensional Assessment  Dimension 1:  Acute Intoxication and/or Withdrawal Potential:      Dimension 2:  Biomedical Conditions and Complications:      Dimension 3:  Emotional, Behavioral, or Cognitive Conditions and Complications:     Dimension 4:  Readiness to Change:     Dimension 5:  Relapse, Continued use, or Continued Problem Potential:     Dimension 6:  Recovery/Living Environment:     ASAM Severity Score:    ASAM Recommended Level of Treatment:     Substance use Disorder (SUD)    Recommendations for Services/Supports/Treatments:    DSM5 Diagnoses: Patient Active Problem List   Diagnosis Date Noted   Obesity (BMI 30.0-34.9) 08/04/2023  Chronic constipation 08/04/2023   Vitamin D deficiency 08/04/2023   Chronic dryness of both eyes 08/04/2023   Gastroesophageal reflux disease 08/04/2023   Tobacco use 05/15/2022   Hypoglycemia 05/15/2022   Medication management 02/06/2022   Lethargy 02/05/2022   Acute respiratory failure with hypoxia (HCC) 02/04/2022   CAP (community acquired pneumonia) 02/03/2022   Hypothyroidism 02/03/2022   Acute pulmonary embolism (HCC) 02/03/2022   Excessive daytime sleepiness 12/21/2021   Seizure (HCC) 06/16/2020   Mood disorder (HCC) 06/16/2020   TBI (traumatic brain injury) [facility])     Patient Centered Plan: Patient is on the following Treatment Plan(s):  Impulse Control   Referrals to Alternative Service(s): Referred to Alternative Service(s):   Place:   Date:   Time:    Referred to Alternative Service(s):   Place:   Date:   Time:    Referred to Alternative Service(s):   Place:   Date:   Time:    Referred to Alternative Service(s):   Place:   Date:   Time:      @BHCOLLABOFCARE @  [employer], LCAS-A

## 2023-08-21 NOTE — ED Notes (Signed)
 Security checked locked valuables area, no wallet present there.

## 2023-08-21 NOTE — ED Provider Notes (Signed)
 Emergency Medicine Observation Re-evaluation Note  Eddie Berry is a 38 y.o. male, seen on rounds today.  Pt initially presented to the ED for complaints of Psychiatric Evaluation Currently, the patient is resting, voices no medical complaints.  Physical Exam  BP 127/78   Pulse 93   Temp 98.6 F (37 C) (Oral)   Resp 18   Ht 6' (1.829 m)   Wt 101.6 kg   SpO2 100%   BMI 30.38 kg/m  Physical Exam General: Resting in no acute distress Cardiac: No cyanosis Lungs: Equal rise and fall Psych: Not agitated  ED Course / MDM  EKG:   I have reviewed the labs performed to date as well as medications administered while in observation.  Recent changes in the last 24 hours include no events overnight.  Plan  Current plan is for psychiatric disposition.    Omer Puccinelli J, MD 08/21/23 [phone]

## 2023-08-21 NOTE — ED Notes (Signed)
 In an attempt to complete med list, pt's listed contacts: legal guardian and "other" were contacted without success. Pt takes several seizure meds and he states last doses were yesterday morning. Pt also gave contact name of "Malva Search" who he states is his 1:1 at the group home, he is unsure of her number. States her contact info is in his wallet, no wallet found in labeled bag of belongings, no note of a wallet on admission.

## 2023-08-21 NOTE — ED Notes (Addendum)
 Eddie Berry (Special educational needs teacher of [facility] [phone]) returned call and Moira Andrews provided updates for meds that were being actively taken, now updated in the system.   Eddie Berry also adds that pt has been off Klonopin x 31mo now and he has had a more aggressive change to behavior x 1wk now.   He also provided office # for the [facility] - [phone].

## 2023-08-21 NOTE — ED Provider Notes (Addendum)
 Emergency department handoff note  Care of this patient was signed out to me at the end of the previous provider shift.  All pertinent patient information was conveyed and all questions were answered.  Patient pending psychiatric evaluation who have deemed patient stable for discharge at this time back to his group home.  There was reportedly difficulty overnight with transportation back to his group home however the group home has called this morning and will be picking him up shortly. The patient has been reexamined and is ready to be discharged.  All diagnostic results have been reviewed and discussed with the patient/family.  Care plan has been outlined and the patient/family understands all current diagnoses, results, and treatment plans.  There are no new complaints, changes, or physical findings at this time.  All questions have been addressed and answered.  Patient was instructed to, and agrees to follow-up with their primary care physician as well as return to the emergency department if any new or worsening symptoms develop.   Josiyah Tozzi K, MD 08/21/23 1191    Pallavi Clifton K, MD 08/21/23 [phone]

## 2023-08-21 NOTE — BH Assessment (Signed)
 This Clinical research associate contacted [facility] via phone to request a psyc assessment for this patient; assessment is currently pending.

## 2023-08-21 NOTE — ED Notes (Signed)
 VM left for [name], pt's legal guardian

## 2023-08-21 NOTE — ED Notes (Signed)
 Pt went home with Ninette Basque, from [facility].

## 2023-08-21 NOTE — ED Notes (Signed)
 Pt resting, eyes closed, respirations even non-labored.

## 2023-08-21 NOTE — ED Notes (Signed)
 IVC PENDING  CONSULT ?

## 2023-08-21 NOTE — ED Notes (Signed)
 This RN went to give pt meds, and pt unresponsive and jaw clenched. No response with sternal rub, VS and CBG checked and Dr. Karlynn Oyster notified. Dr. Hendrick Locke came to assess at bedside. Ativan and lab orders placed. Pt was supposed to be TTS'd at 11pm, psych services notified of delay due to status change.

## 2023-08-21 NOTE — ED Provider Notes (Addendum)
 [facility] Provider Note    Event Date/Time   First MD Initiated Contact with Patient 08/21/23 1752     (approximate)   History   Aggressive Behavior   HPI Clemens Lachman is a 38 y.o. male with history of schizophrenia presenting today for aggressive behavior.  Was seen by his staff at his group home and stated he has not been taking his medications.  He has been throwing objects and kicking doors.  He has made threats to harm staff and other residents.  Sent in for further evaluation.  Patient denies SI or HI at this time.  Came in under IVC.  Chart review: Patient was just seen in the ED earlier this morning when he was discharged.  Was evaluated by psychiatry at that time.     Physical Exam   Triage Vital Signs: ED Triage Vitals  Encounter Vitals Group     BP 08/21/23 1732 (!) 124/102     Systolic BP Percentile --      Diastolic BP Percentile --      Pulse Rate 08/21/23 1732 78     Resp 08/21/23 1732 18     Temp 08/21/23 1732 98.2 F (36.8 C)     Temp Source 08/21/23 1732 Oral     SpO2 08/21/23 1732 96 %     Weight 08/21/23 1733 223 lb 15.8 oz (101.6 kg)     Height 08/21/23 1733 6' (1.829 m)     Head Circumference --      Peak Flow --      Pain Score --      Pain Loc --      Pain Education --      Exclude from Growth Chart --     Most recent vital signs: Vitals:   08/21/23 2330 08/21/23 2345  BP: 115/84 112/75  Pulse: 75 80  Resp: (!) 21 18  Temp:    SpO2: 95% 95%   I have reviewed the vital signs. General:  Awake, alert, no acute distress. Head:  Normocephalic, Atraumatic. EENT:  PERRL, EOMI, Oral mucosa pink and moist, Neck is supple. Cardiovascular: Regular rate, 2+ distal pulses. Respiratory:  Normal respiratory effort, symmetrical expansion, no distress.   Extremities:  Moving all four extremities through full ROM without pain.   Neuro:  Alert and oriented.  Interacting appropriately.   Skin:  Warm, dry, no rash.    Psych: Appropriate affect.    ED Results / Procedures / Treatments   Labs (all labs ordered are listed, but only abnormal results are displayed) Labs Reviewed  COMPREHENSIVE METABOLIC PANEL WITH GFR - Abnormal; Notable for the following components:      Result Value   Glucose, Bld 138 (*)    All other components within normal limits  SALICYLATE LEVEL - Abnormal; Notable for the following components:   Salicylate Lvl <7.0 (*)    All other components within normal limits  ACETAMINOPHEN LEVEL - Abnormal; Notable for the following components:   Acetaminophen (Tylenol), Serum <10 (*)    All other components within normal limits  CBC - Abnormal; Notable for the following components:   RBC 4.10 (*)    HCT 38.9 (*)    All other components within normal limits  ETHANOL  URINE DRUG SCREEN, QUALITATIVE ([facility] ONLY)  VALPROIC ACID LEVEL  CBG MONITORING, ED     EKG    RADIOLOGY    PROCEDURES:  Critical Care performed: No  Procedures   MEDICATIONS ORDERED  IN ED: Medications  divalproex (DEPAKOTE) DR tablet 250 mg (has no administration in time range)  divalproex (DEPAKOTE) DR tablet 500 mg (has no administration in time range)  fenofibrate tablet 54 mg (has no administration in time range)  lacosamide (VIMPAT) tablet 200 mg (has no administration in time range)  mirabegron ER (MYRBETRIQ) tablet 50 mg (has no administration in time range)  pantoprazole (PROTONIX) EC tablet 40 mg (has no administration in time range)  paliperidone (INVEGA) 24 hr tablet 9 mg (has no administration in time range)  sertraline (ZOLOFT) tablet 100 mg (has no administration in time range)  LORazepam (ATIVAN) injection 2 mg (has no administration in time range)  LORazepam (ATIVAN) injection 2 mg (2 mg Intravenous Given 08/21/23 2324)  LORazepam (ATIVAN) injection 2 mg (2 mg Intravenous Given 08/21/23 2342)     IMPRESSION / MDM / ASSESSMENT AND PLAN / ED COURSE  I reviewed the triage vital signs  and the nursing notes.                              Differential diagnosis includes, but is not limited to, aggressive behavior, worsening schizophrenia  Patient's presentation is most consistent with acute presentation with potential threat to life or bodily function.  Patient is a 38 year old male coming in under IVC from his group home for aggressive behavior and threats to others.  At this time he is calm and denies any SI or HI.  Medically cleared.  Will get psychiatry to evaluate given brought under IVC with aggressive behavior.  Around time of signout, patient started having episodes of unresponsiveness with eye twitching that appeared like seizure activity.  Was seen by oncoming provider who gave Ativan.  He was now starting to track eyes around the room.  Not currently speaking.  Vital signs otherwise stable.  Oncoming provider will continue to monitor for possible seizure behavior.  The patient has been placed in psychiatric observation due to the need to provide a safe environment for the patient while obtaining psychiatric consultation and evaluation, as well as ongoing medical and medication management to treat the patient's condition.  The patient has been placed under full IVC at this time.     FINAL CLINICAL IMPRESSION(S) / ED DIAGNOSES   Final diagnoses:  Aggressive behavior     Rx / DC Orders   ED Discharge Orders     None        Note:  This document was prepared using Dragon voice recognition software and may include unintentional dictation errors.   Kandee Orion, MD 08/21/23 2244    Kandee Orion, MD 08/21/23 2351

## 2023-08-21 NOTE — ED Notes (Addendum)
 Pt now awakes to voice after 2nd Ativan dose, is able to track me with his eyes. Nonverbal at this time. Remains on monitoring, VSS

## 2023-08-21 NOTE — ED Notes (Signed)
 No response with Ativan. Dr. Hendrick Locke to bedside, repeated dose of Ativan ordered at this time.

## 2023-08-21 NOTE — ED Notes (Signed)
 Pt refused dinner tray, offered a sandwich instead, which he also declined. Pt states "someone is bringing me [employer]". Explained to pt that outside food is not an option while in the hospital. Provided snacks as alternative for now

## 2023-08-22 ENCOUNTER — Observation Stay

## 2023-08-22 ENCOUNTER — Emergency Department

## 2023-08-22 ENCOUNTER — Encounter: Payer: Self-pay | Admitting: Internal Medicine

## 2023-08-22 DIAGNOSIS — R569 Unspecified convulsions: Secondary | ICD-10-CM

## 2023-08-22 DIAGNOSIS — R29818 Other symptoms and signs involving the nervous system: Secondary | ICD-10-CM | POA: Diagnosis not present

## 2023-08-22 LAB — VALPROIC ACID LEVEL: Valproic Acid Lvl: <10 ug/mL — ABNORMAL LOW (ref 50.0–100.0)

## 2023-08-22 MED ORDER — LEVETIRACETAM IN NACL 1500 MG/100ML IV SOLN
1500.0000 mg | Freq: Once | INTRAVENOUS | Status: AC
  Administered 2023-08-22: 1500 mg via INTRAVENOUS
  Filled 2023-08-22: qty 100

## 2023-08-22 MED ORDER — IPRATROPIUM-ALBUTEROL 0.5-2.5 (3) MG/3ML IN SOLN: 3.0000 mL | Freq: Four times a day (QID) | RESPIRATORY_TRACT | Status: DC | PRN

## 2023-08-22 MED ORDER — ENOXAPARIN SODIUM 60 MG/0.6ML IJ SOSY
0.5000 mg/kg | PREFILLED_SYRINGE | INTRAMUSCULAR | Status: DC
  Administered 2023-08-22 – 2023-08-26 (×5): 50 mg via SUBCUTANEOUS
  Filled 2023-08-22 (×5): qty 0.6

## 2023-08-22 MED ORDER — IPRATROPIUM-ALBUTEROL 0.5-2.5 (3) MG/3ML IN SOLN
3.0000 mL | Freq: Four times a day (QID) | RESPIRATORY_TRACT | Status: DC
  Administered 2023-08-22 (×3): 3 mL via RESPIRATORY_TRACT
  Filled 2023-08-22 (×2): qty 3

## 2023-08-22 MED ORDER — ACETAMINOPHEN 325 MG PO TABS: 650.0000 mg | ORAL_TABLET | Freq: Four times a day (QID) | ORAL | Status: DC | PRN

## 2023-08-22 MED ORDER — ONDANSETRON HCL 4 MG/2ML IJ SOLN
4.0000 mg | Freq: Four times a day (QID) | INTRAMUSCULAR | Status: DC | PRN
Start: 2023-08-22 — End: 2023-08-26

## 2023-08-22 MED ORDER — GUAIFENESIN ER 600 MG PO TB12
1200.0000 mg | ORAL_TABLET | Freq: Two times a day (BID) | ORAL | Status: DC
  Administered 2023-08-22 – 2023-08-26 (×9): 1200 mg via ORAL
  Filled 2023-08-22 (×9): qty 2

## 2023-08-22 MED ORDER — ENOXAPARIN SODIUM 40 MG/0.4ML IJ SOSY
40.0000 mg | PREFILLED_SYRINGE | INTRAMUSCULAR | Status: DC
Start: 2023-08-22 — End: 2023-08-22

## 2023-08-22 NOTE — ED Notes (Addendum)
 Pt assisted with call [name]) per request at this time.

## 2023-08-22 NOTE — Procedures (Signed)
 Patient Name: Eddie Berry  MRN: [id number]  Epilepsy Attending: Arleene Lack  Referring Physician/Provider: [place], MD  Date: 08/22/2023 Duration: 26.14 mins  Patient history: 38yo M with ams. EEG to evaluate for seizure  Level of alertness: Awake  AEDs during EEG study: LEV, VPA, LCM, Ativan   Technical aspects: This EEG study was done with scalp electrodes positioned according to the 10-20 International system of electrode placement. Electrical activity was reviewed with band pass filter of 1-70Hz , sensitivity of 7 uV/mm, display speed of 49mm/sec with a 60Hz  notched filter applied as appropriate. EEG data were recorded continuously and digitally stored.  Video monitoring was available and reviewed as appropriate.  Description: The posterior dominant rhythm consists of 8-9 Hz activity of moderate voltage (25-35 uV) seen predominantly in posterior head regions, symmetric and reactive to eye opening and eye closing. Drowsiness was characterized by attenuation of posterior dominant rhythm. There is an excessive amount of 15 to 18 Hz beta activity distributed symmetrically and diffusely. Hyperventilation and photic stimulation were not performed.     ABNORMALITY - Excessive beta, generalized  IMPRESSION: This study is within normal limits. The excessive beta activity seen in the background is most likely due to the effect of benzodiazepine and is a benign EEG pattern. No seizures or epileptiform discharges were seen throughout the recording.  A normal interictal EEG does not exclude the diagnosis of epilepsy.   [name] O [name]

## 2023-08-22 NOTE — ED Notes (Signed)
 Pt now unresponsive to pain or voice. No longer opening eyes. Dr. Hendrick Locke to bedside to assess again, Keppra  bolus ordered

## 2023-08-22 NOTE — ED Notes (Signed)
 No hospital beds available per unit secretary.

## 2023-08-22 NOTE — ED Notes (Signed)
 TTS messaged to request re-eval of pt's IVC order.

## 2023-08-22 NOTE — ED Notes (Signed)
 Pt moved to hospital bed, bed alarm placed on.

## 2023-08-22 NOTE — ED Notes (Signed)
 Spoke with tele-psych at this time- consult slotted for 6:15 pm today.

## 2023-08-22 NOTE — Progress Notes (Signed)
 Eeg done

## 2023-08-22 NOTE — ED Notes (Signed)
EEG in process

## 2023-08-22 NOTE — Progress Notes (Signed)
 PHARMACIST - PHYSICIAN COMMUNICATION  CONCERNING:  Enoxaparin  (Lovenox ) for DVT Prophylaxis    RECOMMENDATION: Patient was prescribed enoxaprin 40mg  q24 hours for VTE prophylaxis.   Filed Weights   08/21/23 1733  Weight: 101.6 kg (223 lb 15.8 oz)    Body mass index is 30.38 kg/m.  Estimated Creatinine Clearance: 118.7 mL/min (by C-G formula based on SCr of 1.02 mg/dL).   Based on [facility] policy patient is candidate for enoxaparin  0.5mg /kg TBW SQ every 24 hours based on BMI being >30.   DESCRIPTION: Pharmacy has adjusted enoxaparin  dose per [facility] policy.  Patient is now receiving enoxaparin  50 mg every 24 hours   Malone Sear, PharmD, BCPS Clinical Pharmacist 08/22/2023 8:03 AM

## 2023-08-22 NOTE — ED Notes (Signed)
ivc/pending consult.. 

## 2023-08-22 NOTE — ED Notes (Addendum)
 ED tech transporting pt on cardiac monitor, security escorting. Notified tele-psych of transfer to inpatient room ([phone]) at this time.

## 2023-08-22 NOTE — ED Notes (Signed)
 Pt heard falling in room at this time, found laying on floor upon entry. Pt alert and oriented x3, denies pain or injury, denies hitting head. Pt states he had to urinate, denies awareness of call bell. Pt assisted back to bed and reoriented to use of call bell. No bleeding or obvious deformities, or injury noted. Pt given urinal and instructed on use for assistance. Bed alarm placed. Consulting civil engineer notified.

## 2023-08-22 NOTE — ED Provider Notes (Signed)
 Shortly after arrival to shift I was alerted by nursing staff the patient was unresponsive.  I evaluated the patient, he would not respond to painful stimuli.  No generalized tonic-clonic symptoms however on ocular exam he did have rapid eye movement.  I did have concerns for possible seizure.  He then got multiple Ativan  doses as well as a Keppra  load.  He then became responsive to painful stimuli when track with his eyes.  I ordered a head CT which did not show any acute abnormality.  On recheck patient continued to be somnolent but would wake up to painful stimuli.  I did do not think patient continues to have seizures, however I do think he is likely postictal and having some sedation second of medication.  I do think patient would benefit from further workup and management.  Discussed with Dr. Vallarie Gauze with the hospitalist service to evaluate for admission.   Marylynn Soho, MD 08/22/23 [phone]

## 2023-08-22 NOTE — ED Notes (Signed)
 Pt switched over to stretcher and taken to CT by this RN and Shelagh Derrick, NT. At this time, pt remains hard to arouse but awakens to sternal rub then was able to shift himself over to CT table.

## 2023-08-22 NOTE — BH Assessment (Signed)
 Attempted to assist with patient's psyc assessment via IRIS, patient was schedule for his assessment at 11pm but patient is currently unresponsive. Will follow up with patient's nurse and EDP regarding patient's medical status

## 2023-08-22 NOTE — ED Notes (Signed)
 Hospital bed requested.

## 2023-08-22 NOTE — ED Notes (Signed)
IVC  MEDICAL  ADMIT 

## 2023-08-22 NOTE — H&P (Signed)
 History and Physical    Eddie Berry:[id number] DOB: [birth year] DOA: 08/21/2023  PCP: Eddie Charter, DO (Confirm with patient/family/NH records and if not entered, this has to be entered at [facility] point of entry) Patient coming from: Home  I have personally briefly reviewed patient's old medical records in [facility]  Chief Complaint: Patient remains unresponsive  HPI: Eddie Berry is a 38 y.o. male with medical history significant of TBI, seizure disorder, schizophrenia, cigarette smoking, asthma, presented with suicidal ideation.  Patient came to ED yesterday evening for  aggressive behaviors of throwing things away and kicking furniture as well as threatening ED staff.  In addition, he also claimed that he has not been taking his seizure medications.  During ED stay, patient was [facility] but patient suddenly became unresponsive.  ED physician went to check him and found patient eyes closed but responding to physical stimuli and having some rapid eye movement.  ED physician suspect patient was having seizure.  Patient was given Ativan  and loaded with Keppra .  Blood work showed Depakote  level undetectable.   Review of Systems: Unable to perform, patient remains altered.  Past Medical History:  Diagnosis Date   Allergy    Asthma    Blood transfusion without reported diagnosis    Seizures (HCC)    TBI (traumatic brain injury) (HCC)     Past Surgical History:  Procedure Laterality Date   ANKLE SURGERY     APPENDECTOMY     BRAIN SURGERY     LUNG SURGERY       reports that he quit smoking about 3 years ago. His smoking use included cigarettes. He started smoking about 25 years ago. He has a 22 pack-year smoking history. He has never used smokeless tobacco. He reports that he does not currently use alcohol . He reports that he does not currently use drugs.  Allergies  Allergen Reactions   Antihistamines, Diphenhydramine-Type Other (See Comments)    UNK reaction   Nsaids  Other (See Comments)    Unk reaction   Prednisone Other (See Comments)    UNK reaction   Aspirin Rash   Penicillins Rash    No family history on file.   Prior to Admission medications   Medication Sig Start Date End Date Taking? Authorizing Provider  acetaminophen  (TYLENOL ) 500 MG tablet Take 500 mg by mouth every 6 (six) hours as needed for mild pain (pain score 1-3), moderate pain (pain score 4-6), fever or headache.   Yes [provider]  albuterol  (VENTOLIN  HFA) 108 (90 Base) MCG/ACT inhaler Inhale 2 puffs into the lungs every 6 (six) hours as needed for wheezing or shortness of breath.   Yes [provider]  azelastine (ASTELIN) 0.1 % nasal spray Place 2 sprays into both nostrils as needed for rhinitis. Use in each nostril as directed   Yes [provider]  cetirizine (ZYRTEC) 10 MG tablet Take 10 mg by mouth daily.   Yes [provider]  Cholecalciferol  (VITAMIN D3) 50 MCG (2000 UT) TABS Take 1 tablet by mouth daily.   Yes [provider]  divalproex  (DEPAKOTE ) 250 MG DR tablet Take 250 mg by mouth at bedtime.   Yes [provider]  divalproex  (DEPAKOTE ) 500 MG DR tablet Take 500 mg by mouth in the morning.   Yes [provider]  docusate sodium  (COLACE) 100 MG capsule Take 1 capsule (100 mg total) by mouth daily. 08/04/23  Yes Pardue, Asencion Blacksmith, DO  docusate sodium  (COLACE) 100  MG capsule Take 1 capsule (100 mg total) by mouth daily as needed for mild constipation. 08/04/23  Yes Pardue, Asencion Blacksmith, DO  fenofibrate  (TRICOR ) 48 MG tablet Take 48 mg by mouth daily.   Yes [provider]  fluticasone  (FLONASE ) 50 MCG/ACT nasal spray Place 2 sprays into both nostrils daily.   Yes [provider]  hydroxypropyl methylcellulose / hypromellose (ISOPTO TEARS / GONIOVISC) 2.5 % ophthalmic solution Place 2 drops into both eyes 2 (two) times daily. 08/04/23  Yes Pardue, Asencion Blacksmith, DO  lacosamide  (VIMPAT ) 200 MG TABS tablet  Take 1 tablet (200 mg total) by mouth 2 (two) times daily. 01/21/23  Yes Bryson Carbine, MD  mirabegron  ER (MYRBETRIQ ) 50 MG TB24 tablet Take 50 mg by mouth daily.   Yes [provider]  Multiple Vitamin (THEREMS PO) Take 1 tablet by mouth daily.   Yes [provider]  omeprazole (PRILOSEC) 20 MG capsule Take 20 mg by mouth daily.   Yes [provider]  paliperidone  (INVEGA ) 6 MG 24 hr tablet Take 9 mg by mouth daily.   Yes [provider]  sertraline  (ZOLOFT ) 100 MG tablet Take 100 mg by mouth daily.   Yes [provider]  sertraline  (ZOLOFT ) 50 MG tablet Take 50 mg by mouth at bedtime.   Yes [provider]  lamoTRIgine  (LAMICTAL ) 100 MG tablet Take 100 mg by mouth 2 (two) times daily.    [provider]  levothyroxine  (SYNTHROID ) 25 MCG tablet Take 25 mcg by mouth daily before breakfast.    [provider]  Melatonin 10 MG TABS Take 1 tablet by mouth at bedtime.    [provider]  mirtazapine (REMERON) 15 MG tablet Take 15 mg by mouth at bedtime.    [provider]  propranolol  (INDERAL ) 10 MG tablet Take 10 mg by mouth daily.    [provider]  traZODone  (DESYREL ) 50 MG tablet Take 50 mg by mouth at bedtime. Patient not taking: Reported on 08/22/2023    [provider]    Physical Exam: Vitals:   08/22/23 0600 08/22/23 0615 08/22/23 0639 08/22/23 0700  BP: 99/64 96/65 (!) 101/58 102/72  Pulse: (!) 51 67 63 (!) 58  Resp: 16 18 20 18   Temp:      TempSrc:      SpO2: 96% 95% 93% 97%  Weight:      Height:        Constitutional: NAD, calm, comfortable Vitals:   08/22/23 0600 08/22/23 0615 08/22/23 0639 08/22/23 0700  BP: 99/64 96/65 (!) 101/58 102/72  Pulse: (!) 51 67 63 (!) 58  Resp: 16 18 20 18   Temp:      TempSrc:      SpO2: 96% 95% 93% 97%  Weight:      Height:       Eyes: PERRL, lids and conjunctivae normal ENMT: Mucous membranes are moist. Posterior pharynx clear  of any exudate or lesions.Normal dentition.  Neck: normal, supple, no masses, no thyromegaly Respiratory: clear to auscultation bilaterally, coarse crackles bilaterally, scattered wheezing, increasing respiratory effort. No accessory muscle use.  Cardiovascular: Regular rate and rhythm, no murmurs / rubs / gallops. No extremity edema. 2+ pedal pulses. No carotid bruits.  Abdomen: no tenderness, no masses palpated. No hepatosplenomegaly. Bowel sounds positive.  Musculoskeletal: no clubbing / cyanosis. No joint deformity upper and lower extremities. Good ROM, no contractures. Normal muscle tone.  Skin: no rashes, lesions, ulcers. No induration Neurologic: No facial droops, moving  all limbs, not following commands Psychiatric: Openness to physical stimuli of light touch, confused   Labs on Admission: I have personally reviewed following labs and imaging studies  CBC: Recent Labs  Lab 08/20/23 2019 08/21/23 1739  WBC 7.0 8.2  HGB 12.4* 13.1  HCT 37.5* 38.9*  MCV 93.8 94.9  PLT 280 294   Basic Metabolic Panel: Recent Labs  Lab 08/20/23 2019 08/21/23 1739  NA 135 138  K 3.5 3.7  CL 103 105  CO2 23 24  GLUCOSE 133* 138*  BUN 14 14  CREATININE 0.91 1.02  CALCIUM 9.1 9.5   GFR: Estimated Creatinine Clearance: 118.7 mL/min (by C-G formula based on SCr of 1.02 mg/dL). Liver Function Tests: Recent Labs  Lab 08/20/23 2019 08/21/23 1739  AST 23 26  ALT 19 21  ALKPHOS 42 43  BILITOT 0.5 0.9  PROT 7.5 7.6  ALBUMIN 4.2 4.5   No results for input(s): "LIPASE", "AMYLASE" in the last 168 hours. No results for input(s): "AMMONIA" in the last 168 hours. Coagulation Profile: No results for input(s): "INR", "PROTIME" in the last 168 hours. Cardiac Enzymes: No results for input(s): "CKTOTAL", "CKMB", "CKMBINDEX", "TROPONINI" in the last 168 hours. BNP (last 3 results) No results for input(s): "PROBNP" in the last 8760 hours. HbA1C: No results for input(s): "HGBA1C" in the last 72  hours. CBG: Recent Labs  Lab 08/21/23 2310  GLUCAP 92   Lipid Profile: No results for input(s): "CHOL", "HDL", "LDLCALC", "TRIG", "CHOLHDL", "LDLDIRECT" in the last 72 hours. Thyroid  Function Tests: No results for input(s): "TSH", "T4TOTAL", "FREET4", "T3FREE", "THYROIDAB" in the last 72 hours. Anemia Panel: No results for input(s): "VITAMINB12", "FOLATE", "FERRITIN", "TIBC", "IRON", "RETICCTPCT" in the last 72 hours. Urine analysis:    Component Value Date/Time   COLORURINE YELLOW (A) 05/15/2022 1600   APPEARANCEUR HAZY (A) 05/15/2022 1600   LABSPEC 1.011 05/15/2022 1600   PHURINE 7.0 05/15/2022 1600   GLUCOSEU NEGATIVE 05/15/2022 1600   HGBUR NEGATIVE 05/15/2022 1600   BILIRUBINUR NEGATIVE 05/15/2022 1600   KETONESUR NEGATIVE 05/15/2022 1600   PROTEINUR NEGATIVE 05/15/2022 1600   NITRITE NEGATIVE 05/15/2022 1600   LEUKOCYTESUR NEGATIVE 05/15/2022 1600    Radiological Exams on Admission: CT Head Wo Contrast Result Date: 08/22/2023 CLINICAL DATA:  Altered mental status. EXAM: CT HEAD WITHOUT CONTRAST TECHNIQUE: Contiguous axial images were obtained from the base of the skull through the vertex without intravenous contrast. RADIATION DOSE REDUCTION: This exam was performed according to the departmental dose-optimization program which includes automated exposure control, adjustment of the mA and/or kV according to patient size and/or use of iterative reconstruction technique. 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 No new cortical based infarct, hemorrhage, mass or mass effect are seen. There is no midline shift. There is background mild atrophy and small-vessel disease. Mild atrophic ventriculomegaly.  Basal cisterns are patent. Vascular: No hyperdense vessel or unexpected calcification. Skull: Negative for fractures or focal lesions. Right frontal and left frontotemporal craniotomy changes are chronically seen. Sinuses/Orbits: No acute finding. Other: None. IMPRESSION: 1. No acute intracranial CT findings or interval changes. 2. Chronic changes including multiple infarcts, and postsurgical changes. Electronically Signed   By: Denman Fischer M.D.   On: 08/22/2023 04:47    EKG: Ordered  Assessment/Plan Principal Problem:  Observed seizure-like activity (HCC)  (please populate well all problems here in Problem List. (For example, if patient is on BP meds at home and you resume or decide to hold them, it is a problem that needs to be her. Same for CAD, COPD, HLD and so on)  AMS Seizure of noncompliance -Probably still postictal, doubt patient is having status epilepticus, ordered EEG. - Seizure precaution, aspiration precaution - Patient was loaded with Keppra  x 1 - Continue home antiseizure medication: Depakote  and Vimpat  once able to take p.o. - UDS negative  Aggressive behavior History of schizophrenia - On IVC - Suspect patient has not been compliant with his psychiatry medication as well. - As needed antipsychotic medications - Psychiatry consulted  Probably aspiration - Patient has bilateral lower lungs crackles and wheezing with increasing respiratory rate, suspicious for aspiration given possible seizure episode.  Check chest x-ray and consider antibiotics - Aspiration precaution, HOB 30 degree  Asthma exacerbation acute - DuoNebs - Incentive spirometry - Peak flow  DVT prophylaxis: Lovenox  Code Status: Full code Family Communication: None at bedside Disposition Plan: Expect less than 2 midnight hospital stay Consults called: Psy Admission status: PCU obs   [place] MD [facility] Pager [phone]  08/22/2023, 7:38 AM

## 2023-08-23 DIAGNOSIS — R569 Unspecified convulsions: Secondary | ICD-10-CM | POA: Diagnosis not present

## 2023-08-23 DIAGNOSIS — R29818 Other symptoms and signs involving the nervous system: Secondary | ICD-10-CM | POA: Diagnosis not present

## 2023-08-23 LAB — CBC
HCT: 37.9 % — ABNORMAL LOW (ref 39.0–52.0)
Hemoglobin: 12.7 g/dL — ABNORMAL LOW (ref 13.0–17.0)
MCH: 31.2 pg (ref 26.0–34.0)
MCHC: 33.5 g/dL (ref 30.0–36.0)
MCV: 93.1 fL (ref 80.0–100.0)
Platelets: 278 10*3/uL (ref 150–400)
RBC: 4.07 MIL/uL — ABNORMAL LOW (ref 4.22–5.81)
RDW: 12.1 % (ref 11.5–15.5)
WBC: 7.1 10*3/uL (ref 4.0–10.5)
nRBC: 0 % (ref 0.0–0.2)

## 2023-08-23 LAB — BASIC METABOLIC PANEL WITH GFR
Anion gap: 9 (ref 5–15)
BUN: 15 mg/dL (ref 6–20)
CO2: 27 mmol/L (ref 22–32)
Calcium: 8.9 mg/dL (ref 8.9–10.3)
Chloride: 103 mmol/L (ref 98–111)
Creatinine, Ser: 0.86 mg/dL (ref 0.61–1.24)
GFR, Estimated: >60 mL/min (ref >60)
Glucose, Bld: 120 mg/dL — ABNORMAL HIGH (ref 70–99)
Potassium: 3.7 mmol/L (ref 3.5–5.1)
Sodium: 139 mmol/L (ref 135–145)

## 2023-08-23 NOTE — Progress Notes (Addendum)
 Progress Note   Patient: Eddie Berry ZOX:[id number] DOB: 12/01/1985 DOA: 08/21/2023     0 DOS: the patient was seen and examined on 08/23/2023   Brief hospital course: HPI on admission 08/22/23: "Eddie Berry is a 38 y.o. male with medical history significant of TBI, seizure disorder, schizophrenia, cigarette smoking, asthma, presented with suicidal ideation.   Patient came to ED yesterday evening for  aggressive behaviors of throwing things away and kicking furniture as well as threatening ED staff.  In addition, he also claimed that he has not been taking his seizure medications.  During ED stay, patient was [facility] but patient suddenly became unresponsive.  ED physician went to check him and found patient eyes closed but responding to physical stimuli and having some rapid eye movement.  ED physician suspect patient was having seizure.  Patient was given Ativan  and loaded with Keppra .  Blood work showed Depakote  level undetectable."  Patient was admitted to the medical floor for further evaluation and management.  Psychiatry consulted give patient under IVC for suicidal ideations prior to admission while in the ED.  EEG obtained and normal.   Patient is medically stable awaiting Psychiatry consult and potential admission to behavior health unit.   Assessment and Plan:  AMS - likely post-ictal state Seizure medication noncompliance EEG normal.   UDS negative. -- Seizure and aspiration precautions -- Loaded with Keppra  x 1 on admission -- Continue home Depakote  and Vimpat     Aggressive behavior History of schizophrenia -- On IVC -- Suspect patient has not been compliant with his psychiatry medication as well. -- As needed antipsychotic medications -- Psychiatry consulted   Probably aspiration Patient was noted to have bilateral lower lungs crackles and wheezing with tachypnea on admission, concerning for possible aspiration given possible seizure episode.   Chest x-ray  negative -- Monitor off antibiotics -- Aspiration precaution, HOB 30 degree   Asthma exacerbation acute - imrpved, not wheezing today -- No need for systemic steroids at this time -- DuoNebs -- Incentive spirometry -- Peak flow       Subjective: Pt seen with sitter at bedside. Asking to remove heart monitor, had already removed it.  Asks about going home, wants to leave.  Denies suicidal ideations.   Physical Exam: Vitals:   08/22/23 2029 08/22/23 2350 08/23/23 0545 08/23/23 0803  BP: 101/75 122/69 118/76 94/77  Pulse: (!) 106 87 77 77  Resp: 20 20 18 16   Temp: 98.6 F (37 C) 98.2 F (36.8 C) 98.3 F (36.8 C) 98.5 F (36.9 C)  TempSrc:  Oral Oral   SpO2: 94% 94% 96% 96%  Weight:      Height:       General exam: awake, alert, no acute distress HEENT: moist mucus membranes, hearing grossly normal  Respiratory system: CTAB, no wheezes, rales or rhonchi, normal respiratory effort. Cardiovascular system: normal S1/S2, RRR, no pedal edema.   Gastrointestinal system: soft, NT, ND, no HSM felt, +bowel sounds. Central nervous system: A&O x3. no gross focal neurologic deficits, abnormal speech appears baseline  Extremities: moves all, no edema, normal tone Psychiatry: normal mood, congruent affect, abnormal judgement and insight, no SI, no apparent AH or VH    Data Reviewed:  EEG normal  BMP normal except glucose 120 Hbg 12.7  Family Communication: None  Disposition: Status is: Observation Remains admitted under IVC pending psychiatry consult.    Planned Discharge Destination: Home    Time spent: 45 minutes  Author: Montey Apa, DO 08/23/2023 12:16 PM  For on call review [email or web address].

## 2023-08-23 NOTE — Plan of Care (Signed)

## 2023-08-23 NOTE — Plan of Care (Signed)
  Problem: Health Behavior/Discharge Planning: Goal: Ability to manage health-related needs will improve Outcome: Progressing   Problem: Clinical Measurements: Goal: Ability to maintain clinical measurements within normal limits will improve Outcome: Progressing   Problem: Clinical Measurements: Goal: Will remain free from infection Outcome: Progressing   Problem: Clinical Measurements: Goal: Diagnostic test results will improve Outcome: Progressing   

## 2023-08-24 DIAGNOSIS — R569 Unspecified convulsions: Secondary | ICD-10-CM

## 2023-08-24 DIAGNOSIS — R29818 Other symptoms and signs involving the nervous system: Secondary | ICD-10-CM | POA: Diagnosis not present

## 2023-08-24 NOTE — Progress Notes (Addendum)
 Progress Note   Patient: Eddie Berry:[id number] DOB: 05-16-1985 DOA: 08/21/2023     0 DOS: the patient was seen and examined on 08/24/2023   Brief hospital course: HPI on admission 08/22/23: "Eddie Berry is a 38 y.o. male with medical history significant of TBI, seizure disorder, schizophrenia, cigarette smoking, asthma, presented with suicidal ideation.   Patient came to ED yesterday evening for  aggressive behaviors of throwing things away and kicking furniture as well as threatening ED staff.  In addition, he also claimed that he has not been taking his seizure medications.  During ED stay, patient was [facility] but patient suddenly became unresponsive.  ED physician went to check him and found patient eyes closed but responding to physical stimuli and having some rapid eye movement.  ED physician suspect patient was having seizure.  Patient was given Ativan  and loaded with Keppra .  Blood work showed Depakote  level undetectable."  Patient was admitted to the medical floor for further evaluation and management.  Psychiatry consulted give patient under IVC for suicidal ideations prior to admission while in the ED.  EEG obtained and normal.   4/19 - Patient is medically stable awaiting Psychiatry consult and potential admission to behavior health unit.  4/20 - Psychiatry followed up and cleared patient for discharge, no longer needs inpatient psych admission.   Unable to reach guardian or group home staff to get patient discharged from hospital.  TOC following to assist.   Assessment and Plan:  AMS - likely post-ictal state Seizure medication noncompliance EEG normal.   UDS negative. -- Seizure and aspiration precautions -- Loaded with Keppra  x 1 on admission -- Continue home Depakote  and Vimpat     Aggressive behavior History of schizophrenia -- Psychiatry consulted -- 4/20 Psych rescinded IVC, but recommend continued 1:1 safety sitter -- Suspect patient has not been compliant  with his psychiatry medication as well. -- As needed antipsychotic medications   Probably aspiration Patient was noted to have bilateral lower lungs crackles and wheezing with tachypnea on admission, concerning for possible aspiration given possible seizure episode.   Chest x-ray negative -- Monitor off antibiotics -- Aspiration precautions   Asthma exacerbation acute - imrpved, not wheezing today -- No need for systemic steroids at this time -- DuoNebs -- Incentive spirometry -- Peak flow       Subjective: Pt seen with sitter at bedside. He states he feels great and is asking to go home today.  He states the medicine we use here is better than what he takes at home and wants to get the same medicine at home that we're giving here.   Physical Exam: Vitals:   08/24/23 0539 08/24/23 0745 08/24/23 1138 08/24/23 1158  BP: 105/65 102/74  120/78  Pulse: 67 78  88  Resp: 19   18  Temp: 98.3 F (36.8 C) 98.7 F (37.1 C)  98.9 F (37.2 C)  TempSrc:  Oral  Oral  SpO2: 93% 97% 96% 96%  Weight:      Height:       General exam: awake, alert, no acute distress HEENT: moist mucus membranes, hearing grossly normal  Respiratory system:on room air, normal respiratory effort. Cardiovascular system: RRR, no pedal edema.   Central nervous system: A&O x3. no gross focal neurologic deficits, abnormal speech appears baseline  Extremities: moves all, no edema, normal tone Psychiatry: normal mood, congruent affect, abnormal judgement and insight, no SI, no apparent AH or VH    Data Reviewed:  EEG normal  BMP  normal except glucose 120 Hbg 12.7  Family Communication: None  Disposition: Status is: Observation  Medically stable and cleared by Psychiatry for discharge. Patient is from group home and I have been unable to reach Eddie Berry at the home or patient's guardian Eddie Berry with DSS for updates.  Request TOC to coordinate d/c back to group home    Planned Discharge  Destination: Home    Time spent: 35 minutes  Author: Montey Apa, DO 08/24/2023 1:46 PM  For on call review [email or web address].

## 2023-08-24 NOTE — Plan of Care (Signed)

## 2023-08-24 NOTE — Plan of Care (Signed)
°  Problem: Health Behavior/Discharge Planning: Goal: Ability to manage health-related needs will improve Outcome: Progressing   Problem: Clinical Measurements: Goal: Ability to maintain clinical measurements within normal limits will improve Outcome: Progressing   Problem: Nutrition: Goal: Adequate nutrition will be maintained Outcome: Progressing   Problem: Coping: Goal: Level of anxiety will decrease Outcome: Progressing   

## 2023-08-24 NOTE — Consult Note (Addendum)
 [facility] Psychiatric Consult Initial  Patient Name: .Eddie Berry  MRN: [id number]  DOB: [birth year]  Consult Order details:  Orders (From admission, onward)     Start     Ordered   08/24/23 0759  IP CONSULT TO PSYCHIATRY       Ordering Provider: Montey Apa, DO  Provider:  (Not yet assigned)  Question Answer Comment  Location [facility]   Reason for Consult? Medically Clear, under IVC since arrival to ED, please follow up today      08/24/23 0800   08/21/23 1836  IP CONSULT TO PSYCHIATRY       Ordering Provider: Kandee Orion, MD  Provider:  (Not yet assigned)  Question Answer Comment  Place call to: psych   Reason for Consult Admit   Diagnosis/Clinical Info for Consult: aggressive behavior, IVC      08/21/23 1835   08/21/23 1836  CONSULT TO CALL ACT TEAM       Ordering Provider: Kandee Orion, MD  Provider:  (Not yet assigned)  Question:  Reason for Consult?  Answer:  Psych consult   08/21/23 1835             Mode of Visit: In person    Psychiatry Consult Evaluation  Service Date: August 24, 2023 LOS:  LOS: 0 days  Chief Complaint Aggressive Behavior  Primary Psychiatric Diagnoses  Schizophrenia Seizure disorder Traumatic brain injury   Assessment  Eddie Berry is a 38 y.o. male admitted: Medicallyfor 08/21/2023  5:49 PM for unresponsive related to seizure disorder. He carries the psychiatric diagnoses of schizophrenia and has a past medical history with asthma disorder, seizure disorder, traumatic brain injury.  His current presentation of is pleasant, cooperative is most consistent with well managed schizophrenia. He meets criteria for outpatient based on mood congruent.  Current outpatient psychotropic medications include Depakote  750 daily, and Zoloft  100 mg daily.  valproic acid  level reviewed: 4/17 historically he has had minimal response to these medications. He was noncompliant with medications prior to admission as  evidenced by seizure disorder aggressive behavior. On initial examination, patient pleasant, calm and  cooperative . please see plan below for detailed recommendations.   Diagnoses:  Active Hospital problems: Principal Problem:   Observed seizure-like activity (HCC)    Plan   ## Psychiatric Medication Recommendations:  -Continue Zoloft  100 mg p.o. daily - Continue Depakote  250 mg daily 500 mg nightly  ## Medical Decision Making Capacity: Patient has a guardian and has thus been adjudicated incompetent; please involve patients guardian in medical decision making  ## Further Work-up:  -- Valproic acid  U/A or UDS -- most recent EKG on 4/17 had QtC of 318 -- Pertinent labwork reviewed earlier this admission includes: Continue follow-up with outpatient providers for valproic acid  level 7 days.   ## Disposition:-- There are no psychiatric contraindications to discharge at this time  ## Behavioral / Environmental: - No specific recommendations at this time.     ## Safety and Observation Level:  - Based on my clinical evaluation, I estimate the patient to be at moderate risk of self harm in the current setting. - At this time, we recommend  safety sitter  1:1 Observation. This decision is based on my review of the chart including patient's history and current presentation, interview of the patient, mental status examination, and consideration of suicide risk including evaluating suicidal ideation, plan, intent, suicidal or self-harm behaviors, risk factors, and protective factors. This judgment is based on  our ability to directly address suicide risk, implement suicide prevention strategies, and develop a safety plan while the patient is in the clinical setting. Please contact our team if there is a concern that risk level has changed.  CSSR Risk Category:C-SSRS RISK CATEGORY: Error: Q3, 4, or 5 should not be populated when Q2 is No  Suicide Risk Assessment: Patient has following  modifiable risk factors for suicide: untreated depression and medication noncompliance, which we are addressing by recommending follow-up with current outpatient psychiatrist restarted mood stabilization medication while hospitalized. Patient has following non-modifiable or demographic risk factors for suicide: male gender Patient has the following protective factors against suicide: Supportive friends and Frustration tolerance  Thank you for this consult request. Recommendations have been communicated to the primary team.  We will continue outpatient services with therapy and psychiatry at this time.   Eddie Reagin, NP       History of Present Illness  Relevant Aspects of [name] Course:  Admitted on 08/21/2023 provide initial admission assessment note: "Patient came to ED yesterday evening for  aggressive behaviors of throwing things away and kicking furniture as well as threatening ED staff.  In addition, he also claimed that he has not been taking his seizure medications.  During ED stay, patient was [facility] but patient suddenly became unresponsive.  ED physician went to check him and found patient eyes closed but responding to physical stimuli and having some rapid eye movement.  ED physician suspect patient was having seizure.  Patient was given Ativan  and loaded with Keppra .  Blood work showed Depakote  level undetectable."  Patient Report: Eddie Berry 38 year old Caucasian male presents with reports of acute aggression.  Patient was seen and evaluated face-to-face by this provider.  He reports feeling stressed and overwhelmed at the home.  Reports being stressed out by one of his housemates. "  stress explosions" he is denying suicidal or homicidal ideations.  Denies auditory visual hallucinations.  He denies that he has been taking his medication as directed.  States he is followed by psychiatrist through [facility] in [place] .  Unable to recall the name of the  psychiatrist he reports he has a legal guardian Eddie Berry.   This provider attempted to follow-up with group home staff [facility] for additional collateral. Seena presents pleasant calm cooperative.  Very personable throughout this assessment.  States he has been very apologetic since his admission.  He reports he has been in contact with group home since his admission.  Safety sitter at bedside.  Psych ROS:  Depres Long message with sion: Depression Anxiety:  History  Mania (lifetime and current): denied Psychosis: (lifetime and current): denied  Collateral information:  Contacted Cletus Dakins at [phone]  no answer   Review of Systems  Psychiatric/Behavioral:  Negative for depression and suicidal ideas. The patient is nervous/anxious.   All other systems reviewed and are negative.    Psychiatric and Social History  Psychiatric History:  Information collected from EHR  Prev Dx/Sx: Schizophrenia, aggressive behavior Current Psych Provider: [facility] Meds (current): Zoloft  and Depakote .  Previously prescribed lamotrigine  however has been noncompliant with medications. Previous Med Trials: Lamotrigine  Therapy: Denied  Prior Psych Hospitalization: [facility], February 19 Prior Self Harm: Denied Prior Violence: Documented history related to aggression  Family Psych History: Reports his biological family is located on [place] Family Hx suicide: Unknown  Social History:  Developmental Hx:  Educational Hx: N/A Occupational Hx: N/A, currently resides at a group home Legal Hx: Denied  Living Situation: Group home Spiritual Hx:  Access to weapons/lethal means: Denied  Substance History Alcohol : N/A Type of alcohol  N/A Last Drink N/A Number of drinks per day  History of alcohol  withdrawal seizures N/A History of DT's N/A Tobacco: N/A Illicit drugs: N/A Prescription drug abuse: N/A Rehab hx: N/A  Exam Findings  Physical Exam:  Vital Signs:  Temp:  [98.3 F (36.8  C)-98.9 F (37.2 C)] 98.9 F (37.2 C) (04/20 1158) Pulse Rate:  [57-90] 57 (04/20 1158) Resp:  [16-19] 18 (04/20 1138) BP: (102-120)/(55-80) 120/78 (04/20 1158) SpO2:  [93 %-97 %] 96 % (04/20 1158) Blood pressure 120/78, pulse (!) 57, temperature 98.9 F (37.2 C), temperature source Oral, resp. rate 18, height 6' (1.829 m), weight 101.6 kg, SpO2 96%. Body mass index is 30.38 kg/m.  Physical Exam Vitals and nursing note reviewed.  Psychiatric:        Mood and Affect: Mood normal.        Behavior: Behavior normal.     Mental Status Exam: General Appearance: Casual  Orientation:  Full (Time, Place, and Person)  Memory:  Immediate;   Good Recent;   Good  Concentration:  Concentration: Good  Recall:  Good  Attention  Good  Eye Contact:  Good  Speech:  Clear and Coherent  Language:  Good  Volume:  Normal  Mood: congruent   Affect:  Congruent  Thought Process:  Coherent  Thought Content:  Logical  Suicidal Thoughts:  No  Homicidal Thoughts:  No  Judgement:  Fair  Insight:  Good  Psychomotor Activity:  Normal  Akathisia:  No  Fund of Knowledge:  Fair      Assets:  Communication Skills Desire for Improvement  Cognition:  Impaired,  Moderate  ADL's:  Intact  AIMS (if indicated):        Other History   These have been pulled in through the EMR, reviewed, and updated if appropriate.  Family History:  The patient's family history is not on file.  Medical History: Past Medical History:  Diagnosis Date   Allergy    Asthma    Blood transfusion without reported diagnosis    Seizures (HCC)    TBI (traumatic brain injury) (HCC)     Surgical History: Past Surgical History:  Procedure Laterality Date   ANKLE SURGERY     APPENDECTOMY     BRAIN SURGERY     LUNG SURGERY       Medications:   Current Facility-Administered Medications:    acetaminophen  (TYLENOL ) tablet 650 mg, 650 mg, Oral, Q6H PRN, Antoniette Batty T, MD   divalproex  (DEPAKOTE ) DR tablet 250 mg, 250  mg, Oral, QHS, Kandee Orion, MD, 250 mg at 08/23/23 2118   divalproex  (DEPAKOTE ) DR tablet 500 mg, 500 mg, Oral, Daily, Kandee Orion, MD, 500 mg at 08/24/23 1015   enoxaparin  (LOVENOX ) injection 50 mg, 0.5 mg/kg, Subcutaneous, Q24H, Hallaji, Sheema M, RPH, 50 mg at 08/24/23 1017   fenofibrate  tablet 54 mg, 54 mg, Oral, Daily, Kandee Orion, MD, 54 mg at 08/24/23 1017   guaiFENesin  (MUCINEX ) 12 hr tablet 1,200 mg, 1,200 mg, Oral, BID, Zhang, Ping T, MD, 1,200 mg at 08/24/23 1016   ipratropium-albuterol  (DUONEB) 0.5-2.5 (3) MG/3ML nebulizer solution 3 mL, 3 mL, Nebulization, Q6H PRN, Antoniette Batty T, MD   lacosamide  (VIMPAT ) tablet 200 mg, 200 mg, Oral, BID, Kandee Orion, MD, 200 mg at 08/24/23 1016   LORazepam  (ATIVAN ) injection 2 mg, 2 mg, Intravenous, Once, Marylynn Soho,  MD   mirabegron  ER (MYRBETRIQ ) tablet 50 mg, 50 mg, Oral, Daily, Kandee Orion, MD, 50 mg at 08/24/23 1016   ondansetron  (ZOFRAN ) injection 4 mg, 4 mg, Intravenous, Q6H PRN, Antoniette Batty T, MD   paliperidone  (INVEGA ) 24 hr tablet 9 mg, 9 mg, Oral, Daily, Kandee Orion, MD, 9 mg at 08/24/23 1016   pantoprazole  (PROTONIX ) EC tablet 40 mg, 40 mg, Oral, Daily, Kandee Orion, MD, 40 mg at 08/24/23 1016   sertraline  (ZOLOFT ) tablet 100 mg, 100 mg, Oral, Daily, Kandee Orion, MD, 100 mg at 08/24/23 1015  Allergies: Allergies  Allergen Reactions   Antihistamines, Diphenhydramine-Type Other (See Comments)    UNK reaction   Nsaids Other (See Comments)    Unk reaction   Prednisone Other (See Comments)    UNK reaction   Aspirin Rash   Penicillins Rash    Eddie Reagin, NP

## 2023-08-25 ENCOUNTER — Encounter: Payer: Self-pay | Admitting: Internal Medicine

## 2023-08-25 DIAGNOSIS — R29818 Other symptoms and signs involving the nervous system: Secondary | ICD-10-CM | POA: Diagnosis not present

## 2023-08-25 DIAGNOSIS — R569 Unspecified convulsions: Secondary | ICD-10-CM | POA: Diagnosis not present

## 2023-08-25 MED ORDER — ORAL CARE MOUTH RINSE: 15.0000 mL | OROMUCOSAL | Status: DC | PRN

## 2023-08-25 NOTE — Care Management Important Message (Signed)
 Important Message  Patient Details  Name: Eddie Berry MRN: [id number] Date of Birth: 1985-09-06   Important Message Given:  Yes - Medicare IM     Anise Kerns 08/25/2023, 1:05 PM

## 2023-08-25 NOTE — Plan of Care (Signed)
  Problem: Elimination: Goal: Will not experience complications related to bowel motility Outcome: Progressing   Problem: Elimination: Goal: Will not experience complications related to bowel motility Outcome: Progressing   Problem: Elimination: Goal: Will not experience complications related to urinary retention Outcome: Progressing   Problem: Pain Managment: Goal: General experience of comfort will improve and/or be controlled Outcome: Progressing   Problem: Safety: Goal: Ability to remain free from injury will improve Outcome: Progressing   Problem: Skin Integrity: Goal: Risk for impaired skin integrity will decrease Outcome: Progressing

## 2023-08-25 NOTE — TOC Transition Note (Signed)
 Transition of Care [facility]) - Discharge Note   Patient Details  Name: Eddie Berry MRN: [id number] Date of Birth: [birth year]  [facility] CM/SW Contact:  Janne Faulk C Grady Lucci, RN Phone Number: 08/25/2023, 4:12 PM   Clinical Narrative:    1:30pm Spoke with patient's legal guardian, [name]. She has concerns about patient returning to group home with his Clozaril . [name] stated patient was previously taking Clozaril . Patient group home changed to [facility]. He missed 8 days of his medication prior to this admission. [name] stated patient is a risk to himself and others when he is not compliant with medication. He refused his meds. She was advised the Psych NP would eval patient to prior to discharge and make changes. [name] is agreeable for patient to return to group home today.   Spoke with Cletus Dakins, Group home owner. He is agreeable to accept patient back however would like him to be started back on his psych meds.MD updated.   4:12pm Attempt to contact Group Home owner, [name]. No answer. Left a message.  4:22pm Spoke with group home owner. He refused to pick patient up stating he would not pick patient up unless he has been given Cloazril. MD and nurse updated.            Patient Goals and CMS Choice            Discharge Placement                       Discharge Plan and Services Additional resources added to the After Visit Summary for                                       Social Drivers of Health (SDOH) Interventions SDOH Screenings   Food Insecurity: No Food Insecurity (08/22/2023)  Housing: Low Risk  (08/22/2023)  Transportation Needs: No Transportation Needs (08/22/2023)  Utilities: Not At Risk (08/22/2023)  Depression (PHQ2-9): Low Risk  (08/04/2023)  Tobacco Use: Medium Risk (08/25/2023)     Readmission Risk Interventions     No data to display

## 2023-08-25 NOTE — Final Consult Note (Addendum)
 Consultant Final Sign-Off Note    Assessment/Final recommendations   Chart review:   Eddie Berry is a 38 y.o. male followed by me for Admitted on 08/21/2023 provide initial admission assessment note: "Patient came to ED yesterday evening for  aggressive behaviors of throwing things away and kicking furniture as well as threatening ED staff.  In addition, he also claimed that he has not been taking his seizure medications.  During ED stay, patient was [facility] but patient suddenly became unresponsive.  ED physician went to check him and found patient eyes closed but responding to physical stimuli and having some rapid eye movement.  ED physician suspect patient was having seizure.  Patient was given Ativan  and loaded with Keppra .  Blood work showed Depakote  level undetectable."   Patient Report: Eddie Berry 38 year old Caucasian male presents with reports of acute aggression.  Patient was seen and evaluated face-to-face by this provider.  He reports feeling stressed and overwhelmed at the home.  Reports being stressed out by one of his housemates. "  stress explosions" he is denying suicidal or homicidal ideations.  Denies auditory visual hallucinations.  He denies that he has been taking his medication as directed.  States he is followed by psychiatrist through [facility] in [place] [facility] .  Unable to recall the name of the psychiatrist he reports he has a legal guardian Eddie Berry.    Face to face evaluation by this provider after chart review:  38 year old who is pleasant and cooperative upon approach. He is sitting in his room, alert and oriented x 4. He is casually dressed and groom. He has good hygiene. He appears healthy and well nourished. His thought process is WNL. Patient does not appear to be preoccupied. Patient denies hallucinations. Denies thoughts of self-harm. Denies homicidal ideations. Patient has good eye contact and maintains appropriate behaviors throughout this  assessment. Patient is knowledgeable of his health conditions and has remained agreeable with treatment.  He reports that his current medications (Depakote , Zoloft , Ativan  and Invega  are working very well and would like to maintain the same regimen upon discharge. Patient requests writer to discuss it with his guardian.  Patient reports that his appetite is good. He reports that "I sleep perfect".  He reports no concerns about returning to his current group home. Reports that he feels supported by his sisters, his guardian and the caregivers at the group home. He denies current medical concerns and does not appear to be in any acute distress.   Patient's DSS guardian  Eddie Berry [phone] is contacted and updates provided. She shares that his outpatient provider has suggested another antipsychotic medications that would work better for patient (Clozaril ) but patient has refused to take it. She was recommended to review the current medications with the outpatient provider as pt reports they are working well for him.      Wound care (if applicable):    Diet at discharge: per primary team   Activity at discharge: per primary team   Follow-up appointment:     Pending results:  Unresulted Labs (From admission, onward)    None        Medication recommendations:   Other recommendations:    Thank you for allowing us  to participate in the care of your patient!  Please consult us  again if you have further needs for your patient.  Elston Halsted 08/25/2023 1:55 PM    Subjective     Objective  Vital signs in last 24 hours: Temp:  [98  F (36.7 C)-98.6 F (37 C)] 98.3 F (36.8 C) (04/21 1220) Pulse Rate:  [80-103] 80 (04/21 1220) Resp:  [17-18] 18 (04/21 1220) BP: (113-128)/(79-97) 115/80 (04/21 1220) SpO2:  [91 %-98 %] 97 % (04/21 1220)  General: Alert and oriented. Pleasant and cooperative. Denies SI/HI/AVH. No sign of distress. Taking medications, eating and  sleeping well.    Pertinent labs and Studies: Recent Labs    08/23/23 0431  WBC 7.1  HGB 12.7*  HCT 37.9*   BMET Recent Labs    08/23/23 0431  NA 139  K 3.7  CL 103  CO2 27  GLUCOSE 120*  BUN 15  CREATININE 0.86  CALCIUM 8.9   No results for input(s): "LABURIN" in the last 72 hours. Results for orders placed or performed during the hospital encounter of 05/15/22  Resp panel by RT-PCR (RSV, Flu A&B, Covid) Anterior Nasal Swab     Status: None   Collection Time: 05/15/22  4:00 PM   Specimen: Anterior Nasal Swab  Result Value Ref Range Status   SARS Coronavirus 2 by RT PCR NEGATIVE NEGATIVE Final    Comment: (NOTE) SARS-CoV-2 target nucleic acids are NOT DETECTED.  The SARS-CoV-2 RNA is generally detectable in upper respiratory specimens during the acute phase of infection. The lowest concentration of SARS-CoV-2 viral copies this assay can detect is 138 copies/mL. A negative result does not preclude SARS-Cov-2 infection and should not be used as the sole basis for treatment or other patient management decisions. A negative result may occur with  improper specimen collection/handling, submission of specimen other than nasopharyngeal swab, presence of viral mutation(s) within the areas targeted by this assay, and inadequate number of viral copies(<138 copies/mL). A negative result must be combined with clinical observations, patient history, and epidemiological information. The expected result is Negative.  Fact Sheet for Patients:  BloggerCourse.com  Fact Sheet for Healthcare Providers:  SeriousBroker.it  This test is no t yet approved or cleared by the [place]  FDA and  has been authorized for detection and/or diagnosis of SARS-CoV-2 by FDA under an Emergency Use Authorization (EUA). This EUA will remain  in effect (meaning this test can be used) for the duration of the COVID-19 declaration under Section  564(b)(1) of the Act, 21 U.S.C.section 360bbb-3(b)(1), unless the authorization is terminated  or revoked sooner.       Influenza A by PCR NEGATIVE NEGATIVE Final   Influenza B by PCR NEGATIVE NEGATIVE Final    Comment: (NOTE) The Xpert Xpress SARS-CoV-2/FLU/RSV plus assay is intended as an aid in the diagnosis of influenza from Nasopharyngeal swab specimens and should not be used as a sole basis for treatment. Nasal washings and aspirates are unacceptable for Xpert Xpress SARS-CoV-2/FLU/RSV testing.  Fact Sheet for Patients: BloggerCourse.com  Fact Sheet for Healthcare Providers: SeriousBroker.it  This test is not yet approved or cleared by the [place]  FDA and has been authorized for detection and/or diagnosis of SARS-CoV-2 by FDA under an Emergency Use Authorization (EUA). This EUA will remain in effect (meaning this test can be used) for the duration of the COVID-19 declaration under Section 564(b)(1) of the Act, 21 U.S.C. section 360bbb-3(b)(1), unless the authorization is terminated or revoked.     Resp Syncytial Virus by PCR NEGATIVE NEGATIVE Final    Comment: (NOTE) Fact Sheet for Patients: BloggerCourse.com  Fact Sheet for Healthcare Providers: SeriousBroker.it  This test is not yet approved or cleared by the [place]  FDA and has been authorized for detection and/or diagnosis  of SARS-CoV-2 by FDA under an Emergency Use Authorization (EUA). This EUA will remain in effect (meaning this test can be used) for the duration of the COVID-19 declaration under Section 564(b)(1) of the Act, 21 U.S.C. section 360bbb-3(b)(1), unless the authorization is terminated or revoked.  Performed at [facility], [address] [place], [place] [address]     Imaging: No results found.

## 2023-08-25 NOTE — Discharge Summary (Addendum)
 Physician Discharge Summary   Patient: Eddie Berry MRN: [id number] DOB: 1986/04/22  Admit date:     08/21/2023  Discharge date: 08/26/2023  Discharge Physician: Montey Apa   PCP: Carlean Charter, DO    08/26/23 AM addendum -- patient's discharge yesterday was delayed when group home refused to pick him up without psychiatry starting a specific medication.  Patient remains stable for discharge and this issue will be addressed at patient's outpatient psychiatry follow up. Group home is now agreeable to pick up patient this morning.   Recommendations at discharge:   Follow up outpatient with Psychiatry Follow up with Primary Care  Repeat Depakote  level in 1 week (on or around 4/27) Monitor closely for efficacy and side effects of psych and seizure medications  Discharge Diagnoses: Principal Problem:   Observed seizure-like activity (HCC)  Resolved Problems:   * No resolved hospital problems. [facility] Course:  HPI on admission 08/22/23: "Eddie Berry is a 38 y.o. male with medical history significant of TBI, seizure disorder, schizophrenia, cigarette smoking, asthma, presented with suicidal ideation.   Patient came to ED yesterday evening for  aggressive behaviors of throwing things away and kicking furniture as well as threatening ED staff.  In addition, he also claimed that he has not been taking his seizure medications.  During ED stay, patient was [facility] but patient suddenly became unresponsive.  ED physician went to check him and found patient eyes closed but responding to physical stimuli and having some rapid eye movement.  ED physician suspect patient was having seizure.  Patient was given Ativan  and loaded with Keppra .  Blood work showed Depakote  level undetectable."   Patient was admitted to the medical floor for further evaluation and management.  Psychiatry consulted give patient under IVC for suicidal ideations prior to admission while in the ED.   EEG obtained  and normal.    4/19 - Patient is medically stable awaiting Psychiatry consult and potential admission to behavior health unit.   4/20 - Psychiatry followed up and cleared patient for discharge, no longer needs inpatient psych admission.   Unable to reach guardian or group home staff to get patient discharged from hospital.  TOC following to assist.  Assessment and Plan:  AMS - likely post-ictal state Seizure medication noncompliance EEG normal.   UDS negative. -- Seizure and aspiration precautions -- Loaded with Keppra  x 1 on admission -- Continue home Depakote  and Vimpat   --Psychiatry recommends check Depakote  level in 1 week --Follow up with outaptient Neurologist   Aggressive behavior - Suspect because pt was refusing meds at home.  No reports of behavioral problems since admission to medical floor. Pt has had 1:1 sitter since admission, no issues reported.  Pt has been compliant with medications since admission.  History of schizophrenia -- Psychiatry consulted -- 4/20 Psych rescinded IVC, but recommend continued 1:1 safety sitter -- Suspect patient has not been compliant with his psychiatry medication as well. -- As needed antipsychotic medications --Follow up closely with outpatient Psychiatry   Probably aspiration Patient was noted to have bilateral lower lungs crackles and wheezing with tachypnea on admission, concerning for possible aspiration given possible seizure episode.   Chest x-ray negative -- Monitored off antibiotics - remains afebrile and asymptomatic -- Aspiration precautions   Asthma exacerbation acute - imrpved, not wheezing today -- No need for systemic steroids at this time -- DuoNebs -- Incentive spirometry -- Peak flow       Consultants: Psychiatry Procedures performed: None  Disposition: Group home Diet recommendation:  Regular diet DISCHARGE MEDICATION: Allergies as of 08/25/2023       Reactions   Antihistamines, Diphenhydramine-type  Other (See Comments)   UNK reaction   Nsaids Other (See Comments)   Unk reaction   Prednisone Other (See Comments)   UNK reaction   Aspirin Rash   Penicillins Rash        Medication List     STOP taking these medications    lamoTRIgine  100 MG tablet Commonly known as: LAMICTAL    traZODone  50 MG tablet Commonly known as: DESYREL        TAKE these medications    acetaminophen  500 MG tablet Commonly known as: TYLENOL  Take 500 mg by mouth every 6 (six) hours as needed for mild pain (pain score 1-3), moderate pain (pain score 4-6), fever or headache.   albuterol  108 (90 Base) MCG/ACT inhaler Commonly known as: VENTOLIN  HFA Inhale 2 puffs into the lungs every 6 (six) hours as needed for wheezing or shortness of breath.   azelastine 0.1 % nasal spray Commonly known as: ASTELIN Place 2 sprays into both nostrils as needed for rhinitis. Use in each nostril as directed   cetirizine 10 MG tablet Commonly known as: ZYRTEC Take 10 mg by mouth daily.   divalproex  500 MG DR tablet Commonly known as: DEPAKOTE  Take 500 mg by mouth in the morning.   divalproex  250 MG DR tablet Commonly known as: DEPAKOTE  Take 250 mg by mouth at bedtime.   docusate sodium  100 MG capsule Commonly known as: Colace Take 1 capsule (100 mg total) by mouth daily.   docusate sodium  100 MG capsule Commonly known as: COLACE Take 1 capsule (100 mg total) by mouth daily as needed for mild constipation.   fenofibrate  48 MG tablet Commonly known as: TRICOR  Take 48 mg by mouth daily.   fluticasone  50 MCG/ACT nasal spray Commonly known as: FLONASE  Place 2 sprays into both nostrils daily.   hydroxypropyl methylcellulose / hypromellose 2.5 % ophthalmic solution Commonly known as: ISOPTO TEARS / GONIOVISC Place 2 drops into both eyes 2 (two) times daily.   lacosamide  200 MG Tabs tablet Commonly known as: VIMPAT  Take 1 tablet (200 mg total) by mouth 2 (two) times daily.   levothyroxine  25 MCG  tablet Commonly known as: SYNTHROID  Take 25 mcg by mouth daily before breakfast.   Melatonin 10 MG Tabs Take 1 tablet by mouth at bedtime.   mirtazapine 15 MG tablet Commonly known as: REMERON Take 15 mg by mouth at bedtime.   Myrbetriq  50 MG Tb24 tablet Generic drug: mirabegron  ER Take 50 mg by mouth daily.   omeprazole 20 MG capsule Commonly known as: PRILOSEC Take 20 mg by mouth daily.   paliperidone  6 MG 24 hr tablet Commonly known as: INVEGA  Take 9 mg by mouth daily.   propranolol  10 MG tablet Commonly known as: INDERAL  Take 10 mg by mouth daily.   sertraline  100 MG tablet Commonly known as: ZOLOFT  Take 100 mg by mouth daily. What changed: Another medication with the same name was removed. Continue taking this medication, and follow the directions you see here.   THEREMS PO Take 1 tablet by mouth daily.   Vitamin D3 50 MCG (2000 UT) Tabs Take 1 tablet by mouth daily.        Discharge Exam: Filed Weights   08/21/23 1733  Weight: 101.6 kg   General exam: awake, alert, no acute distress HEENT: moist mucus membranes, hearing grossly normal  Respiratory system:  on room air, normal respiratory effort. Cardiovascular system: RRR, no peripheral edema.   Gastrointestinal system: soft, NT, ND, no HSM felt, +bowel sounds. Central nervous system: A&O x 2+. no gross focal neurologic deficits, normal speech Extremities: moves all, no edema, normal tone Psychiatry: normal mood, flat affect, denies SI, no obvious AH/VH   Condition at discharge: stable  The results of significant diagnostics from this hospitalization (including imaging, microbiology, ancillary and laboratory) are listed below for reference.   Imaging Studies: EEG adult Result Date: 08/22/2023 Arleene Lack, MD     08/22/2023  3:02 PM Patient Name: Jaser Fullen MRN: [id number] Epilepsy Attending: Arleene Lack Referring Physician/Provider: [place], MD Date: 08/22/2023 Duration: 26.14 mins  Patient history: 38yo M with ams. EEG to evaluate for seizure Level of alertness: Awake AEDs during EEG study: LEV, VPA, LCM, Ativan  Technical aspects: This EEG study was done with scalp electrodes positioned according to the 10-20 International system of electrode placement. Electrical activity was reviewed with band pass filter of 1-70Hz , sensitivity of 7 uV/mm, display speed of 96mm/sec with a 60Hz  notched filter applied as appropriate. EEG data were recorded continuously and digitally stored.  Video monitoring was available and reviewed as appropriate. Description: The posterior dominant rhythm consists of 8-9 Hz activity of moderate voltage (25-35 uV) seen predominantly in posterior head regions, symmetric and reactive to eye opening and eye closing. Drowsiness was characterized by attenuation of posterior dominant rhythm. There is an excessive amount of 15 to 18 Hz beta activity distributed symmetrically and diffusely. Hyperventilation and photic stimulation were not performed.   ABNORMALITY - Excessive beta, generalized IMPRESSION: This study is within normal limits. The excessive beta activity seen in the background is most likely due to the effect of benzodiazepine and is a benign EEG pattern. No seizures or epileptiform discharges were seen throughout the recording. A normal interictal EEG does not exclude the diagnosis of epilepsy. Arleene Lack   DG Chest 1 View Result Date: 08/22/2023 CLINICAL DATA:  Seizure with coughing congestion. EXAM: CHEST  1 VIEW COMPARISON:  07/31/2023 FINDINGS: Stable asymmetric elevation right hemidiaphragm. The lungs are clear without focal pneumonia, edema, pneumothorax or pleural effusion. Cardiopericardial silhouette is at upper limits of normal for size. No acute bony abnormality. Telemetry leads overlie the chest. IMPRESSION: No active disease. Electronically Signed   By: Donnal Fusi M.D.   On: 08/22/2023 07:57   CT Head Wo Contrast Result Date:  08/22/2023 CLINICAL DATA:  Altered mental status. EXAM: CT HEAD WITHOUT CONTRAST TECHNIQUE: Contiguous axial images were obtained from the base of the skull through the vertex without intravenous contrast. RADIATION DOSE REDUCTION: This exam was performed according to the departmental dose-optimization program which includes automated exposure control, adjustment of the mA and/or kV according to patient size and/or use of iterative reconstruction technique. 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 No new cortical based infarct, hemorrhage, mass or mass effect are seen. There is no midline shift. There is background mild atrophy and small-vessel disease. Mild atrophic ventriculomegaly. Basal cisterns are patent. Vascular: No hyperdense vessel or unexpected calcification. Skull: Negative for fractures or focal lesions. Right frontal and left frontotemporal craniotomy changes are chronically seen. Sinuses/Orbits: No acute finding. Other: None. IMPRESSION: 1. No acute intracranial CT findings  or interval changes. 2. Chronic changes including multiple infarcts, and postsurgical changes. Electronically Signed   By: Denman Fischer M.D.   On: 08/22/2023 04:47   DG Chest Portable 1 View Result Date: 07/31/2023 CLINICAL DATA:  Fever.  Crackles on examination. EXAM: PORTABLE CHEST 1 VIEW COMPARISON:  06/11/2022 FINDINGS: Shallow inspiration. Heart size and pulmonary vascularity are normal for technique. New development of linear opacity in the right mid lung likely representing pneumonia or atelectasis. No pleural effusion or pneumothorax. Mediastinal contours appear intact. IMPRESSION: New linear area of consolidation in the right mid lung  likely representing pneumonia or atelectasis. Electronically Signed   By: Boyce Byes M.D.   On: 07/31/2023 20:39    Microbiology: Results for orders placed or performed during the hospital encounter of 05/15/22  Resp panel by RT-PCR (RSV, Flu A&B, Covid) Anterior Nasal Swab     Status: None   Collection Time: 05/15/22  4:00 PM   Specimen: Anterior Nasal Swab  Result Value Ref Range Status   SARS Coronavirus 2 by RT PCR NEGATIVE NEGATIVE Final    Comment: (NOTE) SARS-CoV-2 target nucleic acids are NOT DETECTED.  The SARS-CoV-2 RNA is generally detectable in upper respiratory specimens during the acute phase of infection. The lowest concentration of SARS-CoV-2 viral copies this assay can detect is 138 copies/mL. A negative result does not preclude SARS-Cov-2 infection and should not be used as the sole basis for treatment or other patient management decisions. A negative result may occur with  improper specimen collection/handling, submission of specimen other than nasopharyngeal swab, presence of viral mutation(s) within the areas targeted by this assay, and inadequate number of viral copies(<138 copies/mL). A negative result must be combined with clinical observations, patient history, and epidemiological information. The expected result is Negative.  Fact Sheet for Patients:  BloggerCourse.com  Fact Sheet for Healthcare Providers:  SeriousBroker.it  This test is no t yet approved or cleared by the [place]  FDA and  has been authorized for detection and/or diagnosis of SARS-CoV-2 by FDA under an Emergency Use Authorization (EUA). This EUA will remain  in effect (meaning this test can be used) for the duration of the COVID-19 declaration under Section 564(b)(1) of the Act, 21 U.S.C.section 360bbb-3(b)(1), unless the authorization is terminated  or revoked sooner.       Influenza A by PCR NEGATIVE NEGATIVE Final    Influenza B by PCR NEGATIVE NEGATIVE Final    Comment: (NOTE) The Xpert Xpress SARS-CoV-2/FLU/RSV plus assay is intended as an aid in the diagnosis of influenza from Nasopharyngeal swab specimens and should not be used as a sole basis for treatment. Nasal washings and aspirates are unacceptable for Xpert Xpress SARS-CoV-2/FLU/RSV testing.  Fact Sheet for Patients: BloggerCourse.com  Fact Sheet for Healthcare Providers: SeriousBroker.it  This test is not yet approved or cleared by the [place]  FDA and has been authorized for detection and/or diagnosis of SARS-CoV-2 by FDA under an Emergency Use Authorization (EUA). This EUA will remain in effect (meaning this test can be used) for the duration of the COVID-19 declaration under Section 564(b)(1) of the Act, 21 U.S.C. section 360bbb-3(b)(1), unless the authorization is terminated or revoked.     Resp Syncytial Virus by PCR NEGATIVE NEGATIVE Final    Comment: (NOTE) Fact Sheet for Patients: BloggerCourse.com  Fact Sheet for Healthcare Providers: SeriousBroker.it  This test is not yet approved or cleared by the [place]  FDA and has been authorized for detection and/or diagnosis of SARS-CoV-2 by FDA under an  Emergency Use Authorization (EUA). This EUA will remain in effect (meaning this test can be used) for the duration of the COVID-19 declaration under Section 564(b)(1) of the Act, 21 U.S.C. section 360bbb-3(b)(1), unless the authorization is terminated or revoked.  Performed at [facility], [address] [place], [place] [address]     Labs: CBC: Recent Labs  Lab 08/20/23 2019 08/21/23 1739 08/23/23 0431  WBC 7.0 8.2 7.1  HGB 12.4* 13.1 12.7*  HCT 37.5* 38.9* 37.9*  MCV 93.8 94.9 93.1  PLT 280 294 278   Basic Metabolic Panel: Recent Labs  Lab 08/20/23 2019 08/21/23 1739 08/23/23 0431  NA  135 138 139  K 3.5 3.7 3.7  CL 103 105 103  CO2 23 24 27   GLUCOSE 133* 138* 120*  BUN 14 14 15   CREATININE 0.91 1.02 0.86  CALCIUM 9.1 9.5 8.9   Liver Function Tests: Recent Labs  Lab 08/20/23 2019 08/21/23 1739  AST 23 26  ALT 19 21  ALKPHOS 42 43  BILITOT 0.5 0.9  PROT 7.5 7.6  ALBUMIN 4.2 4.5   CBG: Recent Labs  Lab 08/21/23 2310  GLUCAP 92    Discharge time spent: greater than 30 minutes.  Signed: Montey Apa, DO [facility] 08/25/2023

## 2023-08-26 DIAGNOSIS — R29818 Other symptoms and signs involving the nervous system: Secondary | ICD-10-CM | POA: Diagnosis not present

## 2023-08-26 NOTE — [place] FL2 (Signed)
 [place]  MEDICAID FL2 LEVEL OF CARE FORM     IDENTIFICATION  Patient Name: Eddie Berry Birthdate: [birth year] Sex: male Admission Date (Current Location): 08/21/2023  [facility] and [place] Number:  Chiropodist and Address:  [facility], [address] [place], [place] [address]      Provider Number: [id number]  Attending Physician Name and Address:  Montey Apa, DO  Relative Name and Phone Number:       Current Level of Care: Hospital Recommended Level of Care: [facility] Prior Approval Number:    Date Approved/Denied:   PASRR Number:    Discharge Plan: Other (Comment) (Family Care Home)    Current Diagnoses: Patient Active Problem List   Diagnosis Date Noted   Observed seizure-like activity (HCC) 08/22/2023   Obesity (BMI 30.0-34.9) 08/04/2023   Chronic constipation 08/04/2023   Vitamin D  deficiency 08/04/2023   Chronic dryness of both eyes 08/04/2023   Gastroesophageal reflux disease 08/04/2023   Tobacco use 05/15/2022   Hypoglycemia 05/15/2022   Medication management 02/06/2022   Lethargy 02/05/2022   Acute respiratory failure with hypoxia (HCC) 02/04/2022   CAP (community acquired pneumonia) 02/03/2022   Hypothyroidism 02/03/2022   Acute pulmonary embolism (HCC) 02/03/2022   Excessive daytime sleepiness 12/21/2021   Seizure (HCC) 06/16/2020   Mood disorder (HCC) 06/16/2020   TBI (traumatic brain injury) (HCC)     Orientation RESPIRATION BLADDER Height & Weight     Self, Time, Situation, Place  Normal Continent Weight: 223 lb 15.8 oz (101.6 kg) Height:  6' (182.9 cm)  BEHAVIORAL SYMPTOMS/MOOD NEUROLOGICAL BOWEL NUTRITION STATUS   (None)   Continent Diet (Regular)  AMBULATORY STATUS COMMUNICATION OF NEEDS Skin     Verbally Normal                       Personal Care Assistance Level of Assistance              Functional Limitations Info  Sight, Hearing, Speech Sight Info: Adequate Hearing  Info: Adequate Speech Info: Adequate    SPECIAL CARE FACTORS FREQUENCY                       Contractures Contractures Info: Not present    Additional Factors Info  Code Status, Allergies Code Status Info: Full ocde Allergies Info: Antihistamines, Diphenhydramine-type, Nsaids, Prednisone, Aspirin, Penicillins           Current Medications (08/26/2023):  This is the current hospital active medication list Current Facility-Administered Medications  Medication Dose Route Frequency Provider Last Rate Last Admin   acetaminophen  (TYLENOL ) tablet 650 mg  650 mg Oral Q6H PRN Antoniette Batty T, MD       divalproex  (DEPAKOTE ) DR tablet 250 mg  250 mg Oral QHS Kandee Orion, MD   250 mg at 08/25/23 2205   divalproex  (DEPAKOTE ) DR tablet 500 mg  500 mg Oral Daily Kandee Orion, MD   500 mg at 08/25/23 9629   enoxaparin  (LOVENOX ) injection 50 mg  0.5 mg/kg Subcutaneous Q24H Hallaji, Sheema M, RPH   50 mg at 08/25/23 5284   fenofibrate  tablet 54 mg  54 mg Oral Daily Kandee Orion, MD   54 mg at 08/25/23 1324   guaiFENesin  (MUCINEX ) 12 hr tablet 1,200 mg  1,200 mg Oral BID Antoniette Batty T, MD   1,200 mg at 08/25/23 2205   ipratropium-albuterol  (DUONEB) 0.5-2.5 (3) MG/3ML nebulizer solution 3 mL  3  mL Nebulization Q6H PRN Antoniette Batty T, MD       lacosamide  (VIMPAT ) tablet 200 mg  200 mg Oral BID Kandee Orion, MD   200 mg at 08/25/23 2205   LORazepam  (ATIVAN ) injection 2 mg  2 mg Intravenous Once Goodman, Graydon, MD       mirabegron  ER (MYRBETRIQ ) tablet 50 mg  50 mg Oral Daily Kandee Orion, MD   50 mg at 08/25/23 1610   ondansetron  (ZOFRAN ) injection 4 mg  4 mg Intravenous Q6H PRN [place], MD       Oral care mouth rinse  15 mL Mouth Rinse PRN Montey Apa, DO       paliperidone  (INVEGA ) 24 hr tablet 9 mg  9 mg Oral Daily Kandee Orion, MD   9 mg at 08/25/23 9604   pantoprazole  (PROTONIX ) EC tablet 40 mg  40 mg Oral Daily Kandee Orion, MD   40 mg at 08/25/23 5409   sertraline   (ZOLOFT ) tablet 100 mg  100 mg Oral Daily Kandee Orion, MD   100 mg at 08/25/23 8119     Discharge Medications: STOP taking these medications     lamoTRIgine  100 MG tablet Commonly known as: LAMICTAL     traZODone  50 MG tablet Commonly known as: DESYREL            TAKE these medications     acetaminophen  500 MG tablet Commonly known as: TYLENOL  Take 500 mg by mouth every 6 (six) hours as needed for mild pain (pain score 1-3), moderate pain (pain score 4-6), fever or headache.    albuterol  108 (90 Base) MCG/ACT inhaler Commonly known as: VENTOLIN  HFA Inhale 2 puffs into the lungs every 6 (six) hours as needed for wheezing or shortness of breath.    azelastine 0.1 % nasal spray Commonly known as: ASTELIN Place 2 sprays into both nostrils as needed for rhinitis. Use in each nostril as directed    cetirizine 10 MG tablet Commonly known as: ZYRTEC Take 10 mg by mouth daily.    divalproex  500 MG DR tablet Commonly known as: DEPAKOTE  Take 500 mg by mouth in the morning.    divalproex  250 MG DR tablet Commonly known as: DEPAKOTE  Take 250 mg by mouth at bedtime.    docusate sodium  100 MG capsule Commonly known as: Colace Take 1 capsule (100 mg total) by mouth daily.    docusate sodium  100 MG capsule Commonly known as: COLACE Take 1 capsule (100 mg total) by mouth daily as needed for mild constipation.    fenofibrate  48 MG tablet Commonly known as: TRICOR  Take 48 mg by mouth daily.    fluticasone  50 MCG/ACT nasal spray Commonly known as: FLONASE  Place 2 sprays into both nostrils daily.    hydroxypropyl methylcellulose / hypromellose 2.5 % ophthalmic solution Commonly known as: ISOPTO TEARS / GONIOVISC Place 2 drops into both eyes 2 (two) times daily.    lacosamide  200 MG Tabs tablet Commonly known as: VIMPAT  Take 1 tablet (200 mg total) by mouth 2 (two) times daily.    levothyroxine  25 MCG tablet Commonly known as: SYNTHROID  Take 25 mcg by mouth daily before  breakfast.    Melatonin 10 MG Tabs Take 1 tablet by mouth at bedtime.    mirtazapine 15 MG tablet Commonly known as: REMERON Take 15 mg by mouth at bedtime.    Myrbetriq  50 MG Tb24 tablet Generic drug: mirabegron  ER Take 50 mg by mouth daily.    omeprazole  20 MG capsule Commonly known as: PRILOSEC Take 20 mg by mouth daily.    paliperidone  6 MG 24 hr tablet Commonly known as: INVEGA  Take 9 mg by mouth daily.    propranolol  10 MG tablet Commonly known as: INDERAL  Take 10 mg by mouth daily.    sertraline  100 MG tablet Commonly known as: ZOLOFT  Take 100 mg by mouth daily. What changed: Another medication with the same name was removed. Continue taking this medication, and follow the directions you see here.    THEREMS PO Take 1 tablet by mouth daily.    Vitamin D3 50 MCG (2000 UT) Tabs Take 1 tablet by mouth daily.    Relevant Imaging Results:  Relevant Lab Results:   Additional Information SS#: [id number]  Odilia Bennett, LCSW

## 2023-08-26 NOTE — TOC Transition Note (Signed)
 Transition of Care [facility]) - Discharge Note   Patient Details  Name: Eddie Berry MRN: [id number] Date of Birth: 03-21-1986  [facility] of [facility]) CM/SW Contact:  Odilia Bennett, LCSW Phone Number: 08/26/2023, 11:19 AM   Clinical Narrative:  Patient has orders to discharge back to his group home today. Legal guardian is aware and agreeable. FL2 and discharge summary placed in discharge packet to be sent with him per group home request. Sent report number to RN. No further concerns. CSW signing off.   Final next level of care: Home/Self Care Barriers to Discharge: Barriers Resolved   Patient Goals and CMS Choice            Discharge Placement                Patient to be transferred to facility by: Group home staff Name of family member notified: [name] Patient and family notified of of transfer: 08/26/23  Discharge Plan and Services Additional resources added to the After Visit Summary for                                       Social Drivers of Health (SDOH) Interventions SDOH Screenings   Food Insecurity: No Food Insecurity (08/22/2023)  Housing: Low Risk  (08/22/2023)  Transportation Needs: No Transportation Needs (08/22/2023)  Utilities: Not At Risk (08/22/2023)  Depression (PHQ2-9): Low Risk  (08/04/2023)  Tobacco Use: Medium Risk (08/25/2023)     Readmission Risk Interventions     No data to display

## 2023-08-26 NOTE — TOC Progression Note (Signed)
 Transition of Care [facility]) - Progression Note    Patient Details  Name: Eddie Berry MRN: [id number] Date of Birth: [birth year]  Transition of Care [facility]) CM/SW Contact  Odilia Bennett, LCSW Phone Number: 08/26/2023, 10:35 AM  Clinical Narrative:   CSW left voicemail for legal guardian. CSW called main number for DSS that is legal guardian and spoke to intake Child psychotherapist, Odilia Bennett. She is aware of legal guardian's concerns regarding medications and said [name] would like to speak to physician about those prior to discharge. CSW notified team and asked them to call Jennifer/Ashley.  Expected Discharge Plan and Services         Expected Discharge Date: 08/26/23                                     Social Determinants of Health (SDOH) Interventions SDOH Screenings   Food Insecurity: No Food Insecurity (08/22/2023)  Housing: Low Risk  (08/22/2023)  Transportation Needs: No Transportation Needs (08/22/2023)  Utilities: Not At Risk (08/22/2023)  Depression (PHQ2-9): Low Risk  (08/04/2023)  Tobacco Use: Medium Risk (08/25/2023)    Readmission Risk Interventions     No data to display

## 2023-08-26 NOTE — Care Management Obs Status (Signed)
 MEDICARE OBSERVATION STATUS NOTIFICATION   Patient Details  Name: Eddie Berry MRN: [id number] Date of Birth: Jul 23, 1985   Medicare Observation Status Notification Given:       Anise Kerns 08/26/2023, 8:16 AM

## 2023-08-26 NOTE — BH Assessment (Signed)
 IVC PAPERS  RESCINDED  PT  NOW  VOL

## 2023-08-27 ENCOUNTER — Other Ambulatory Visit: Payer: Self-pay

## 2023-08-27 ENCOUNTER — Emergency Department
Admission: EM | Admit: 2023-08-27 | Discharge: 2023-08-27 | Disposition: A | Discharge status: Home / Self Care | Attending: Emergency Medicine | Admitting: Emergency Medicine

## 2023-08-27 DIAGNOSIS — R569 Unspecified convulsions: Secondary | ICD-10-CM | POA: Insufficient documentation

## 2023-08-27 LAB — CBC
HCT: 38.8 % — ABNORMAL LOW (ref 39.0–52.0)
Hemoglobin: 12.9 g/dL — ABNORMAL LOW (ref 13.0–17.0)
MCH: 31.5 pg (ref 26.0–34.0)
MCHC: 33.2 g/dL (ref 30.0–36.0)
MCV: 94.6 fL (ref 80.0–100.0)
Platelets: 278 10*3/uL (ref 150–400)
RBC: 4.1 MIL/uL — ABNORMAL LOW (ref 4.22–5.81)
RDW: 12.2 % (ref 11.5–15.5)
WBC: 8.1 10*3/uL (ref 4.0–10.5)
nRBC: 0 % (ref 0.0–0.2)

## 2023-08-27 LAB — BASIC METABOLIC PANEL WITH GFR
Anion gap: 7 (ref 5–15)
BUN: 11 mg/dL (ref 6–20)
CO2: 28 mmol/L (ref 22–32)
Calcium: 9 mg/dL (ref 8.9–10.3)
Chloride: 100 mmol/L (ref 98–111)
Creatinine, Ser: 0.87 mg/dL (ref 0.61–1.24)
GFR, Estimated: >60 mL/min (ref >60)
Glucose, Bld: 92 mg/dL (ref 70–99)
Potassium: 3.5 mmol/L (ref 3.5–5.1)
Sodium: 135 mmol/L (ref 135–145)

## 2023-08-27 LAB — VALPROIC ACID LEVEL: Valproic Acid Lvl: 32 ug/mL — ABNORMAL LOW (ref 50–100)

## 2023-08-27 LAB — CBG MONITORING, ED: Glucose-Capillary: 93 mg/dL (ref 70–99)

## 2023-08-27 MED ORDER — VALPROATE SODIUM 100 MG/ML IV SOLN
1000.0000 mg | Freq: Once | INTRAVENOUS | Status: AC
  Administered 2023-08-27: 1000 mg via INTRAVENOUS
  Filled 2023-08-27: qty 10

## 2023-08-27 NOTE — ED Notes (Signed)
 Patient asleep and lethargic. Seizure pads placed.

## 2023-08-27 NOTE — ED Notes (Signed)
 This nurse assumed care of pt at this time. Pt alert and responsive to verbal stimuli. Stretcher in lowest position with wheels locked. Side rails up x 2 with seizure pads remaining in place. Call light within reach. Denies any needs at this time.

## 2023-08-27 NOTE — Discharge Instructions (Signed)
 Please make sure you are taking your seizure medication as prescribed.  You are still slightly low on the dose but it is becoming more normal at this time.

## 2023-08-27 NOTE — ED Provider Notes (Signed)
 [facility] Provider Note    Event Date/Time   First MD Initiated Contact with Patient 08/27/23 1018     (approximate)   History   Seizures   HPI  Eddie Berry is a 38 y.o. male who presents to the emergency department today from living facility today because of concerns for possible seizure.  Patient has history of epilepsy and was discharged from the hospital yesterday after a stay for seizures and psychiatric illness.  At this time patient appears postictal and cannot give any significant history.  Per report staff it facility was concerned because he slept in longer than normal.     Physical Exam   Triage Vital Signs: ED Triage Vitals  Encounter Vitals Group     BP 08/27/23 1020 109/69     Systolic BP Percentile --      Diastolic BP Percentile --      Pulse Rate 08/27/23 1019 79     Resp 08/27/23 1020 20     Temp 08/27/23 1019 98.1 F (36.7 C)     Temp Source 08/27/23 1019 Oral     SpO2 08/27/23 1020 94 %     Weight --      Height --      Head Circumference --      Peak Flow --      Pain Score --      Pain Loc --      Pain Education --      Exclude from Growth Chart --     Most recent vital signs: Vitals:   08/27/23 1020 08/27/23 1040  BP: 109/69 103/69  Pulse: 77 79  Resp: 20 (!) 23  Temp: 98.1 F (36.7 C)   SpO2: 94% 94%   General: Somnolent, awakens to physical stimuli. CV:  Good peripheral perfusion. Regular rate and rhythm. Resp:  Normal effort. Lungs clear. Abd:  No distention.    ED Results / Procedures / Treatments   Labs (all labs ordered are listed, but only abnormal results are displayed) Labs Reviewed  CBC - Abnormal; Notable for the following components:      Result Value   RBC 4.10 (*)    Hemoglobin 12.9 (*)    HCT 38.8 (*)    All other components within normal limits  VALPROIC ACID  LEVEL - Abnormal; Notable for the following components:   Valproic Acid  Lvl 32 (*)    All other components within  normal limits  BASIC METABOLIC PANEL WITH GFR  CBG MONITORING, ED     EKG  I, Marylynn Soho, attending physician, personally viewed and interpreted this EKG  EKG Time: 1031 Rate: 81 Rhythm: sinus rhythm Axis: normal Intervals: qtc 445 QRS: narrow, q waves v1 ST changes: no st elevation Impression: abnormal ekg   RADIOLOGY None   PROCEDURES:  Critical Care performed: No   MEDICATIONS ORDERED IN ED: Medications - No data to display   IMPRESSION / MDM / ASSESSMENT AND PLAN / ED COURSE  I reviewed the triage vital signs and the nursing notes.                              Differential diagnosis includes, but is not limited to, seizure, post ictal, infection  Patient's presentation is most consistent with acute presentation with potential threat to life or bodily function.   The patient is on the cardiac monitor to evaluate for evidence of arrhythmia and/or  significant heart rate changes.  Patient presented to the emergency department today from group home because of concerns for seizure-like episode.  The time my exam patient is somnolent but does awaken to physical stimuli.  Blood work was checked and patient is not therapeutic on his Depakote .  Will give IV Depakote  here.  Would have concern for possible seizure he had now postictal period.  On reassessment patient continues to be responsive to physical stimuli. Will plan on observing here in the emergency department for improved mentation.     FINAL CLINICAL IMPRESSION(S) / ED DIAGNOSES   Final diagnoses:  Seizure-like activity (HCC)      Note:  This document was prepared using Dragon voice recognition software and may include unintentional dictation errors.    Marylynn Soho, MD 08/27/23 [phone]

## 2023-08-27 NOTE — ED Notes (Signed)
Patient repositioned himself in bed.

## 2023-08-27 NOTE — ED Notes (Signed)
 Returned call from [place] [place], reporting transport in route to pick up pt for discharge.

## 2023-08-27 NOTE — ED Provider Notes (Signed)
  Physical Exam  BP 104/67   Pulse 75   Temp 98.2 F (36.8 C) (Oral)   Resp (!) 22   SpO2 95%   Physical Exam  Procedures  Procedures  ED Course / MDM   Clinical Course as of 08/27/23 1953  Wed Aug 27, 2023  1743 Starting to wake up a little bit more.  Is able to briefly talk with me before he falls back asleep.  Will attempt to give him a couple more hours to return to baseline before possible discharge [DW]    Clinical Course User Index [DW] Kandee Orion, MD   Medical Decision Making Amount and/or Complexity of Data Reviewed Labs: ordered.   Received patient in signout.  38 year old male presenting today for possible seizure.  Had a recent hospitalizations for seizure and psychiatric illness.  Postictal on arrival.  Evaluated by initial provider with largely otherwise reassuring exam aside from being postictal and stable vital signs.  His CBC, BMP reassuring.  No hypoglycemia.  Valproic acid  level slightly low so he was given additional dose of that here today through the IV and discussion with pharmacy to increase his home dose.  Patient was signed out pending reevaluation.  If he is able to fully wake back up from this possible seizure, that can be discharged.  Otherwise if continues to be altered or minimally awake, admit to hospitalist for further care.  Patient now fully alert and conversant.  He walked to the bathroom with no issue.  Tolerating p.o.  Will discharge at this time. He received depakote  here. Will continue his home outpatient regimen.     Kandee Orion, MD 08/27/23 Gerry Krone

## 2023-08-27 NOTE — ED Triage Notes (Addendum)
 Coming from [facility] group home . Seizure like activity this morning. Hx of seizures. Found in room this morning slept through b-fast which is not normal. Recently dx from [facility] for recent eval. ED up clonidine 100mg  to 200mg . Patient was leaning and drooling in kitchen when EMS arrived. No thinners. On many behavioral health medications SSROs. Patient does have Hx of a brain/head surgery reported by EMS. Group home: [phone] manager: [phone] EMS VS: 97.7, 95CBG, 95%, 77, 112/77 (89).

## 2023-08-27 NOTE — ED Notes (Signed)
 Linda Repress, Group Home Manager, called by writer on behalf of discharge transport. Per Royston Cornea, he will call back with transport information and ETA within next 45 mins.

## 2023-08-27 NOTE — ED Notes (Signed)
 Woke patient up. Patient was able to speak to RN in jumbled words. Offered patient food 3x and patient jumbled his words and went back to sleep each time.

## 2023-08-28 ENCOUNTER — Encounter: Payer: Self-pay | Admitting: Emergency Medicine

## 2023-08-28 ENCOUNTER — Other Ambulatory Visit: Payer: Self-pay

## 2023-08-28 ENCOUNTER — Emergency Department
Admission: EM | Admit: 2023-08-28 | Discharge: 2023-08-28 | Disposition: A | Discharge status: Home / Self Care | Attending: Emergency Medicine | Admitting: Emergency Medicine

## 2023-08-28 ENCOUNTER — Emergency Department

## 2023-08-28 DIAGNOSIS — R531 Weakness: Secondary | ICD-10-CM | POA: Insufficient documentation

## 2023-08-28 DIAGNOSIS — R4182 Altered mental status, unspecified: Secondary | ICD-10-CM | POA: Diagnosis present

## 2023-08-28 LAB — COMPREHENSIVE METABOLIC PANEL WITH GFR
ALT: 49 U/L — ABNORMAL HIGH (ref 0–44)
AST: 37 U/L (ref 15–41)
Albumin: 4.2 g/dL (ref 3.5–5.0)
Alkaline Phosphatase: 57 U/L (ref 38–126)
Anion gap: 12 (ref 5–15)
BUN: 12 mg/dL (ref 6–20)
CO2: 26 mmol/L (ref 22–32)
Calcium: 9.5 mg/dL (ref 8.9–10.3)
Chloride: 100 mmol/L (ref 98–111)
Creatinine, Ser: 0.82 mg/dL (ref 0.61–1.24)
GFR, Estimated: >60 mL/min (ref >60)
Glucose, Bld: 107 mg/dL — ABNORMAL HIGH (ref 70–99)
Potassium: 3.8 mmol/L (ref 3.5–5.1)
Sodium: 138 mmol/L (ref 135–145)
Total Bilirubin: 0.2 mg/dL (ref 0.0–1.2)
Total Protein: 7.9 g/dL (ref 6.5–8.1)

## 2023-08-28 LAB — CBC
HCT: 41.2 % (ref 39.0–52.0)
Hemoglobin: 13.6 g/dL (ref 13.0–17.0)
MCH: 31.4 pg (ref 26.0–34.0)
MCHC: 33 g/dL (ref 30.0–36.0)
MCV: 95.2 fL (ref 80.0–100.0)
Platelets: 294 10*3/uL (ref 150–400)
RBC: 4.33 MIL/uL (ref 4.22–5.81)
RDW: 12.1 % (ref 11.5–15.5)
WBC: 9.8 10*3/uL (ref 4.0–10.5)
nRBC: 0 % (ref 0.0–0.2)

## 2023-08-28 LAB — AMMONIA: Ammonia: 30 umol/L (ref 9–35)

## 2023-08-28 MED ORDER — DIVALPROEX SODIUM 500 MG PO DR TAB: 500.0000 mg | DELAYED_RELEASE_TABLET | Freq: Every morning | ORAL | 2 refills | Status: DC

## 2023-08-28 MED ORDER — DIVALPROEX SODIUM 250 MG PO DR TAB: 250.0000 mg | DELAYED_RELEASE_TABLET | Freq: Every day | ORAL | 2 refills | Status: DC

## 2023-08-28 NOTE — ED Provider Notes (Signed)
 [facility] Provider Note    Event Date/Time   First MD Initiated Contact with Patient 08/28/23 1719     (approximate)   History   Altered Mental Status   HPI Eddie Berry is a 38 y.o. male with history of TBI, seizures, schizophrenia presenting today for concerns of altered mental status.  Patient had recent admission to the emergency department for aggressive behavior and eventual seizure activity.  Was found to have undetectable Depakote  level.  Resolved from his seizure and then was able to be discharged home.  Had another seizure-like episode yesterday but awoke from this and was able to walk and tolerate p.o. without issue.  His group home felt like he was still having some difficulty with ambulation today.  They could not define any obvious altered mental status but stated they felt like he was acting differently.  Patient himself denies any acute complaints.  Denies any unilateral numbness or weakness anywhere.  States that he just feels a little weak in his legs.  Chart review: Reviewed recent admission and ED visit.     Physical Exam   Triage Vital Signs: ED Triage Vitals  Encounter Vitals Group     BP 08/28/23 1419 116/87     Systolic BP Percentile --      Diastolic BP Percentile --      Pulse Rate 08/28/23 1419 (!) 107     Resp 08/28/23 1419 18     Temp 08/28/23 1419 97.8 F (36.6 C)     Temp Source 08/28/23 1419 Oral     SpO2 08/28/23 1419 95 %     Weight --      Height --      Head Circumference --      Peak Flow --      Pain Score 08/28/23 1415 0     Pain Loc --      Pain Education --      Exclude from Growth Chart --     Most recent vital signs: Vitals:   08/28/23 1419 08/28/23 1854  BP: 116/87 114/80  Pulse: (!) 107 98  Resp: 18 17  Temp: 97.8 F (36.6 C) 98 F (36.7 C)  SpO2: 95% 98%   Physical Exam: I have reviewed the vital signs and nursing notes. General: Awake, alert, no acute distress.  Nontoxic  appearing. Head:  Atraumatic, normocephalic.   ENT:  EOM intact, PERRL. Oral mucosa is pink and moist with no lesions. Neck: Neck is supple with full range of motion, No meningeal signs. Cardiovascular:  RRR, No murmurs. Peripheral pulses palpable and equal bilaterally. Respiratory:  Symmetrical chest wall expansion.  No rhonchi, rales, or wheezes.  Good air movement throughout.  No use of accessory muscles.   Musculoskeletal:  No cyanosis or edema. Moving extremities with full ROM Abdomen:  Soft, nontender, nondistended. Neuro:  GCS 15, moving all four extremities, interacting appropriately. Speech clear.  Cranial nerves II through XII intact and consistent with his baseline.  Otherwise no obvious sensation deficits to bilateral upper or lower extremities.  No obvious weakness of bilateral upper and lower extremities.  Ambulated to the room without any obvious difficulty. Psych:  Calm, appropriate.   Skin:  Warm, dry, no rash.    ED Results / Procedures / Treatments   Labs (all labs ordered are listed, but only abnormal results are displayed) Labs Reviewed  COMPREHENSIVE METABOLIC PANEL WITH GFR - Abnormal; Notable for the following components:  Result Value   Glucose, Bld 107 (*)    ALT 49 (*)    All other components within normal limits  CBC  AMMONIA  CBG MONITORING, ED     EKG    RADIOLOGY Independently interpreted CT head with no acute pathology   PROCEDURES:  Critical Care performed: No  Procedures   MEDICATIONS ORDERED IN ED: Medications - No data to display   IMPRESSION / MDM / ASSESSMENT AND PLAN / ED COURSE  I reviewed the triage vital signs and the nursing notes.                              Differential diagnosis includes, but is not limited to, seizure-like activity, altered mental status, schizophrenia, electrolyte abnormality  Patient's presentation is most consistent with acute complicated illness / injury requiring diagnostic  workup.  Patient is a 38 year old male presenting today from his group home over concerns of weakness and questionable altered mental status.  His group home member is with him here who has mostly noted some weakness when he walks and felt like he was acting slightly abnormal.  They were requesting CT scan given recent hospitalization for seizures.  His exam is largely unremarkable and appears consistent with the last several times he has been in the emergency department.  No reported seizure activity here today.  Laboratory workup with CBC and CMP unremarkable.  CT imaging shows no acute pathology.  Ammonia levels are normal today with no concern for valproic medication side effect.  Patient feels completely at his baseline and group home agrees with that now.  Has ambulated without any difficulty.  Discussed with him that his valproic acid  level was low in the past which probably contributed to his seizure and they do admit he has missed several doses of that.  Recommended to make sure that he is adequately getting all doses of that at home.  The patient is on the cardiac monitor to evaluate for evidence of arrhythmia and/or significant heart rate changes.     FINAL CLINICAL IMPRESSION(S) / ED DIAGNOSES   Final diagnoses:  Weakness     Rx / DC Orders   ED Discharge Orders     None        Note:  This document was prepared using Dragon voice recognition software and may include unintentional dictation errors.   Kandee Orion, MD 08/28/23 [phone]

## 2023-08-28 NOTE — Discharge Instructions (Addendum)
 CT imaging normal today.  When he was seen several days ago when he had his first seizure his blood levels for his Depakote  (valproic acid ) which treats seizures was undetectable.  I suspect he had missed at least several days of not a week or 2 of this medication given the low level.  When it was checked yesterday was getting closer to normal levels.  Please make sure he is continue to take this every day as well as his other psychiatric medications.

## 2023-08-28 NOTE — ED Triage Notes (Addendum)
 Pt via POV from [facility]. Reports that he was seen last night for seizure like activity. Per staff at the Group Home, pt has not been acting himself this morning and more altered, forgetting things after he was discharged. They were instructed by [facility] to come here for further evaluation and possible CT scan. On arrival, per staff at bedside is acting normally. Pt denies any pain.

## 2023-09-05 ENCOUNTER — Other Ambulatory Visit: Payer: Self-pay | Admitting: Family Medicine

## 2023-09-05 NOTE — Telephone Encounter (Signed)
 Copied from CRM [id number]. Topic: Clinical - Medication Refill >> Sep 05, 2023  9:55 AM Eddie Berry T wrote: Most Recent Primary Care Visit:  Provider: Carlean Charter  Department: [facility]  Visit Type: NEW PT - OFFICE VISIT  Date: 08/04/2023  Medication: fluticasone  (FLONASE ) 50 MCG/ACT nasal spray  Has the patient contacted their pharmacy? No  Is this the correct pharmacy for this prescription? Yes If no, delete pharmacy and type the correct one.  This is the patient's preferred pharmacy:  [facility] - [place], [place] - [address] [place] [address] Phone: [phone] Fax: [phone]   Has the prescription been filled recently? Yes  Is the patient out of the medication? Yes  Has the patient been seen for an appointment in the last year OR does the patient have an upcoming appointment? Yes  Can we respond through MyChart? No  Agent: Please be advised that Rx refills may take up to 3 business days. We ask that you follow-up with your pharmacy.

## 2023-09-08 MED ORDER — FLUTICASONE PROPIONATE 50 MCG/ACT NA SUSP: 2.0000 | Freq: Every day | NASAL | 1 refills | Status: DC

## 2023-09-08 NOTE — Telephone Encounter (Signed)
 Requested medication (s) are due for refill today: routing for review  Requested medication (s) are on the active medication list: yes  Last refill:  12/21/21  Future visit scheduled: yes  Notes to clinic:  Unable to refill per protocol, last refill by another/historical  provider.      Requested Prescriptions  Pending Prescriptions Disp Refills   fluticasone  (FLONASE ) 50 MCG/ACT nasal spray      Sig: Place 2 sprays into both nostrils daily.     Ear, Nose, and Throat: Nasal Preparations - Corticosteroids Failed - 09/08/2023  4:02 PM      Failed - Valid encounter within last 12 months    Recent Outpatient Visits           1 month ago Establishing care with new doctor, encounter for   [facility] Pardue, Asencion Blacksmith, DO       Future Appointments             In 2 months Pardue, Asencion Blacksmith, DO [facility], [facility]

## 2023-09-10 ENCOUNTER — Other Ambulatory Visit: Payer: Self-pay

## 2023-09-10 ENCOUNTER — Emergency Department
Admission: EM | Admit: 2023-09-10 | Discharge: 2023-09-10 | Disposition: A | Discharge status: Home / Self Care | Attending: Emergency Medicine | Admitting: Emergency Medicine

## 2023-09-10 DIAGNOSIS — R0689 Other abnormalities of breathing: Secondary | ICD-10-CM | POA: Diagnosis not present

## 2023-09-10 DIAGNOSIS — R569 Unspecified convulsions: Secondary | ICD-10-CM | POA: Diagnosis not present

## 2023-09-10 DIAGNOSIS — R9431 Abnormal electrocardiogram [ECG] [EKG]: Secondary | ICD-10-CM | POA: Diagnosis not present

## 2023-09-10 DIAGNOSIS — I10 Essential (primary) hypertension: Secondary | ICD-10-CM | POA: Diagnosis not present

## 2023-09-10 LAB — URINALYSIS, ROUTINE W REFLEX MICROSCOPIC
Bilirubin Urine: NEGATIVE
Glucose, UA: NEGATIVE mg/dL
Hgb urine dipstick: NEGATIVE
Ketones, ur: NEGATIVE mg/dL
Leukocytes,Ua: NEGATIVE
Nitrite: NEGATIVE
Protein, ur: NEGATIVE mg/dL
Specific Gravity, Urine: 1.004 — ABNORMAL LOW (ref 1.005–1.030)
pH: 8 (ref 5.0–8.0)

## 2023-09-10 LAB — COMPREHENSIVE METABOLIC PANEL WITH GFR
ALT: 27 U/L (ref 0–44)
AST: 25 U/L (ref 15–41)
Albumin: 3.9 g/dL (ref 3.5–5.0)
Alkaline Phosphatase: 46 U/L (ref 38–126)
Anion gap: 6 (ref 5–15)
BUN: 12 mg/dL (ref 6–20)
CO2: 29 mmol/L (ref 22–32)
Calcium: 9.4 mg/dL (ref 8.9–10.3)
Chloride: 103 mmol/L (ref 98–111)
Creatinine, Ser: 0.8 mg/dL (ref 0.61–1.24)
GFR, Estimated: >60 mL/min (ref >60)
Glucose, Bld: 85 mg/dL (ref 70–99)
Potassium: 3.9 mmol/L (ref 3.5–5.1)
Sodium: 138 mmol/L (ref 135–145)
Total Bilirubin: 0.3 mg/dL (ref 0.0–1.2)
Total Protein: 7.5 g/dL (ref 6.5–8.1)

## 2023-09-10 LAB — CBC WITH DIFFERENTIAL/PLATELET
Abs Immature Granulocytes: 0.04 10*3/uL (ref 0.00–0.07)
Basophils Absolute: 0 10*3/uL (ref 0.0–0.1)
Basophils Relative: 1 %
Eosinophils Absolute: 0.2 10*3/uL (ref 0.0–0.5)
Eosinophils Relative: 3 %
HCT: 43.2 % (ref 39.0–52.0)
Hemoglobin: 13.7 g/dL (ref 13.0–17.0)
Immature Granulocytes: 1 %
Lymphocytes Relative: 35 %
Lymphs Abs: 2.6 10*3/uL (ref 0.7–4.0)
MCH: 30.6 pg (ref 26.0–34.0)
MCHC: 31.7 g/dL (ref 30.0–36.0)
MCV: 96.6 fL (ref 80.0–100.0)
Monocytes Absolute: 0.8 10*3/uL (ref 0.1–1.0)
Monocytes Relative: 11 %
Neutro Abs: 3.8 10*3/uL (ref 1.7–7.7)
Neutrophils Relative %: 49 %
Platelets: 300 10*3/uL (ref 150–400)
RBC: 4.47 MIL/uL (ref 4.22–5.81)
RDW: 12.4 % (ref 11.5–15.5)
WBC: 7.4 10*3/uL (ref 4.0–10.5)
nRBC: 0 % (ref 0.0–0.2)

## 2023-09-10 LAB — MAGNESIUM: Magnesium: 1.8 mg/dL (ref 1.7–2.4)

## 2023-09-10 LAB — URINE DRUG SCREEN, QUALITATIVE (ARMC ONLY)
Amphetamines, Ur Screen: NOT DETECTED
Barbiturates, Ur Screen: NOT DETECTED
Benzodiazepine, Ur Scrn: NOT DETECTED
Cannabinoid 50 Ng, Ur ~~LOC~~: NOT DETECTED
Cocaine Metabolite,Ur ~~LOC~~: NOT DETECTED
MDMA (Ecstasy)Ur Screen: NOT DETECTED
Methadone Scn, Ur: NOT DETECTED
Opiate, Ur Screen: NOT DETECTED
Phencyclidine (PCP) Ur S: NOT DETECTED
Tricyclic, Ur Screen: POSITIVE — AB

## 2023-09-10 LAB — VALPROIC ACID LEVEL: Valproic Acid Lvl: 52 ug/mL (ref 50–100)

## 2023-09-10 MED ORDER — DIVALPROEX SODIUM 500 MG PO DR TAB
500.0000 mg | DELAYED_RELEASE_TABLET | Freq: Once | ORAL | Status: AC
  Administered 2023-09-10: 500 mg via ORAL
  Filled 2023-09-10: qty 1

## 2023-09-10 MED ORDER — LACOSAMIDE 50 MG PO TABS
200.0000 mg | ORAL_TABLET | Freq: Once | ORAL | Status: AC
  Administered 2023-09-10: 200 mg via ORAL
  Filled 2023-09-10: qty 4

## 2023-09-10 MED ORDER — LACTATED RINGERS IV BOLUS
1000.0000 mL | Freq: Once | INTRAVENOUS | Status: AC
  Administered 2023-09-10: 1000 mL via INTRAVENOUS

## 2023-09-10 NOTE — ED Provider Notes (Signed)
 [facility] Provider Note    Event Date/Time   First MD Initiated Contact with Patient 09/10/23 1653     (approximate)   History   Seizures   HPI  Eddie Berry is a 38 y.o. male who presents to the ED for evaluation of Seizures   Review DC summary from admission on 4/21, admitted for 5 days, observed seizure-like activity.  He has a history of TBI, schizophrenia.  Aggressive behavior and psychiatric admissions.  EEG was normal.  Continue Depakote  and Vimpat .  Patient presents to the ED after a witnessed seizure-like episode.  His caregiver provides majority of history.  Reports they were doing routine activities from the group home.  Patient was seated in a chair and when caregiver returned back to him he was poorly responsive.  Sitting up in the chair without any tremulous or seizure-like activity to the extremities but was unresponsive for a few minutes before regaining consciousness.  Back to baseline now.  Reports compliance with medications.  No recent head injuries, illnesses or other concerns.   Physical Exam   Triage Vital Signs: ED Triage Vitals  Encounter Vitals Group     BP      Systolic BP Percentile      Diastolic BP Percentile      Pulse      Resp      Temp      Temp src      SpO2      Weight      Height      Head Circumference      Peak Flow      Pain Score      Pain Loc      Pain Education      Exclude from Growth Chart     Most recent vital signs: Vitals:   09/10/23 1717  BP: (!) 132/98  Pulse: 97  Resp: 18  Temp: 97.8 F (36.6 C)  SpO2: 93%    General: Awake, no distress.  CV:  Good peripheral perfusion.  Resp:  Normal effort.  Abd:  No distention.  MSK:  No deformity noted.  Neuro:  No acute focal deficits appreciated.  No ongoing seizure activity Other:     ED Results / Procedures / Treatments   Labs (all labs ordered are listed, but only abnormal results are displayed) Labs Reviewed   URINALYSIS, ROUTINE W REFLEX MICROSCOPIC - Abnormal; Notable for the following components:      Result Value   Color, Urine STRAW (*)    APPearance CLEAR (*)    Specific Gravity, Urine 1.004 (*)    All other components within normal limits  URINE DRUG SCREEN, QUALITATIVE ([facility] ONLY) - Abnormal; Notable for the following components:   Tricyclic, Ur Screen POSITIVE (*)    All other components within normal limits  VALPROIC ACID  LEVEL  COMPREHENSIVE METABOLIC PANEL WITH GFR  CBC WITH DIFFERENTIAL/PLATELET  MAGNESIUM  LACOSAMIDE     EKG Sinus rhythm with a rate of 93 bpm.  Normal axis and intervals.  No clear signs of acute ischemia.  Wandering baseline  RADIOLOGY   Official radiology report(s): No results found.  PROCEDURES and INTERVENTIONS:  .1-3 Lead EKG Interpretation  Performed by: Arline Bennett, MD Authorized by: Arline Bennett, MD     Interpretation: normal     ECG rate:  94   ECG rate assessment: normal     Rhythm: sinus rhythm     Ectopy: none  Conduction: normal     Medications  lactated ringers bolus 1,000 mL (1,000 mLs Intravenous New Bag/Given 09/10/23 1739)  divalproex  (DEPAKOTE ) DR tablet 500 mg (500 mg Oral Given 09/10/23 1738)  lacosamide  (VIMPAT ) tablet 200 mg (200 mg Oral Given 09/10/23 1738)     IMPRESSION / MDM / ASSESSMENT AND PLAN / ED COURSE  I reviewed the triage vital signs and the nursing notes.  Differential diagnosis includes, but is not limited to, medication noncompliance, breakthrough seizure, cardiac dysrhythmia  {Patient presents with symptoms of an acute illness or injury that is potentially life-threatening.  Patient with seizure disorder presents after an episode of poor responsiveness concerning for seizure-like activity, with a benign workup, back to baseline and suitable for outpatient management with neurologic follow-up.  Looks well and I see him without evidence of acute deficits.  No ongoing seizure activity or signs of status  epilepticus.  Normal CBC, metabolic panel, UA.  Valproic acid  level is therapeutic, lacosamide  level takes longer and will likely not result this evening.  I considered admission for this patient but believe he be suitable for continued outpatient management.  Discussed close ED return precautions.      FINAL CLINICAL IMPRESSION(S) / ED DIAGNOSES   Final diagnoses:  Seizure-like activity (HCC)     Rx / DC Orders   ED Discharge Orders     None        Note:  This document was prepared using Dragon voice recognition software and may include unintentional dictation errors.   Arline Bennett, MD 09/10/23 [phone]

## 2023-09-10 NOTE — Discharge Instructions (Signed)
 Keep taking your medicine and follow-up with your neurologist

## 2023-09-10 NOTE — ED Triage Notes (Signed)
 Pt here via [facility] with c/o of having a seizure, pt HX of same, pt currently not able to talk right now but is alert at this time and did give this RN a fist bump. Pt denies missing any doses of his seizure meds.   Pt live at [facility], address provided by EMS is [address] [employer]), pt resides at [employer] address of [facility].   Pt speaking in at this time. Pt is A&Ox4 at this time.

## 2023-09-14 LAB — LACOSAMIDE: Lacosamide: 6.5 ug/mL (ref 5.0–10.0)

## 2023-09-17 ENCOUNTER — Emergency Department
Admission: EM | Admit: 2023-09-17 | Discharge: 2023-09-17 | Disposition: A | Discharge status: Home Health | Source: Home / Self Care | Attending: Emergency Medicine | Admitting: Emergency Medicine

## 2023-09-17 ENCOUNTER — Encounter: Payer: Self-pay | Admitting: Emergency Medicine

## 2023-09-17 ENCOUNTER — Inpatient Hospital Stay
Admission: EM | Admit: 2023-09-17 | Discharge: 2023-09-19 | DRG: 101 | Disposition: A | Discharge status: Home Health | Attending: Student | Admitting: Student

## 2023-09-17 ENCOUNTER — Other Ambulatory Visit: Payer: Self-pay

## 2023-09-17 DIAGNOSIS — Z79899 Other long term (current) drug therapy: Secondary | ICD-10-CM | POA: Diagnosis not present

## 2023-09-17 DIAGNOSIS — R079 Chest pain, unspecified: Secondary | ICD-10-CM | POA: Diagnosis not present

## 2023-09-17 DIAGNOSIS — G4489 Other headache syndrome: Secondary | ICD-10-CM | POA: Diagnosis not present

## 2023-09-17 DIAGNOSIS — Z87820 Personal history of traumatic brain injury: Secondary | ICD-10-CM

## 2023-09-17 DIAGNOSIS — G40909 Epilepsy, unspecified, not intractable, without status epilepticus: Secondary | ICD-10-CM

## 2023-09-17 DIAGNOSIS — S069X0D Unspecified intracranial injury without loss of consciousness, subsequent encounter: Secondary | ICD-10-CM | POA: Diagnosis not present

## 2023-09-17 DIAGNOSIS — E039 Hypothyroidism, unspecified: Secondary | ICD-10-CM | POA: Diagnosis present

## 2023-09-17 DIAGNOSIS — S069X9S Unspecified intracranial injury with loss of consciousness of unspecified duration, sequela: Secondary | ICD-10-CM | POA: Diagnosis not present

## 2023-09-17 DIAGNOSIS — I2510 Atherosclerotic heart disease of native coronary artery without angina pectoris: Secondary | ICD-10-CM | POA: Diagnosis present

## 2023-09-17 DIAGNOSIS — R9431 Abnormal electrocardiogram [ECG] [EKG]: Secondary | ICD-10-CM | POA: Diagnosis not present

## 2023-09-17 DIAGNOSIS — Z87891 Personal history of nicotine dependence: Secondary | ICD-10-CM

## 2023-09-17 DIAGNOSIS — R413 Other amnesia: Secondary | ICD-10-CM | POA: Diagnosis not present

## 2023-09-17 DIAGNOSIS — Z79890 Hormone replacement therapy (postmenopausal): Secondary | ICD-10-CM | POA: Diagnosis not present

## 2023-09-17 DIAGNOSIS — R000 Tachycardia, unspecified: Secondary | ICD-10-CM | POA: Diagnosis not present

## 2023-09-17 DIAGNOSIS — E785 Hyperlipidemia, unspecified: Secondary | ICD-10-CM | POA: Diagnosis present

## 2023-09-17 DIAGNOSIS — J4489 Other specified chronic obstructive pulmonary disease: Secondary | ICD-10-CM | POA: Diagnosis present

## 2023-09-17 DIAGNOSIS — Z8669 Personal history of other diseases of the nervous system and sense organs: Secondary | ICD-10-CM

## 2023-09-17 DIAGNOSIS — G40A09 Absence epileptic syndrome, not intractable, without status epilepticus: Principal | ICD-10-CM | POA: Diagnosis present

## 2023-09-17 DIAGNOSIS — Z880 Allergy status to penicillin: Secondary | ICD-10-CM | POA: Diagnosis not present

## 2023-09-17 DIAGNOSIS — R569 Unspecified convulsions: Secondary | ICD-10-CM | POA: Insufficient documentation

## 2023-09-17 DIAGNOSIS — E538 Deficiency of other specified B group vitamins: Secondary | ICD-10-CM | POA: Diagnosis not present

## 2023-09-17 DIAGNOSIS — R5383 Other fatigue: Secondary | ICD-10-CM | POA: Diagnosis not present

## 2023-09-17 DIAGNOSIS — R0989 Other specified symptoms and signs involving the circulatory and respiratory systems: Secondary | ICD-10-CM | POA: Diagnosis not present

## 2023-09-17 DIAGNOSIS — R404 Transient alteration of awareness: Secondary | ICD-10-CM | POA: Diagnosis not present

## 2023-09-17 DIAGNOSIS — R55 Syncope and collapse: Secondary | ICD-10-CM | POA: Diagnosis not present

## 2023-09-17 DIAGNOSIS — Z886 Allergy status to analgesic agent status: Secondary | ICD-10-CM | POA: Diagnosis not present

## 2023-09-17 DIAGNOSIS — F840 Autistic disorder: Secondary | ICD-10-CM | POA: Diagnosis present

## 2023-09-17 DIAGNOSIS — G510 Bell's palsy: Secondary | ICD-10-CM | POA: Diagnosis present

## 2023-09-17 DIAGNOSIS — R2689 Other abnormalities of gait and mobility: Secondary | ICD-10-CM | POA: Diagnosis not present

## 2023-09-17 DIAGNOSIS — E559 Vitamin D deficiency, unspecified: Secondary | ICD-10-CM | POA: Diagnosis not present

## 2023-09-17 DIAGNOSIS — R631 Polydipsia: Secondary | ICD-10-CM | POA: Diagnosis not present

## 2023-09-17 DIAGNOSIS — R9389 Abnormal findings on diagnostic imaging of other specified body structures: Secondary | ICD-10-CM | POA: Diagnosis not present

## 2023-09-17 HISTORY — DX: Autistic disorder: F84.0

## 2023-09-17 LAB — CBC WITH DIFFERENTIAL/PLATELET
Abs Immature Granulocytes: 0.01 10*3/uL (ref 0.00–0.07)
Basophils Absolute: 0 10*3/uL (ref 0.0–0.1)
Basophils Relative: 1 %
Eosinophils Absolute: 0.2 10*3/uL (ref 0.0–0.5)
Eosinophils Relative: 3 %
HCT: 41.1 % (ref 39.0–52.0)
Hemoglobin: 13.9 g/dL (ref 13.0–17.0)
Immature Granulocytes: 0 %
Lymphocytes Relative: 35 %
Lymphs Abs: 2 10*3/uL (ref 0.7–4.0)
MCH: 31.7 pg (ref 26.0–34.0)
MCHC: 33.8 g/dL (ref 30.0–36.0)
MCV: 93.6 fL (ref 80.0–100.0)
Monocytes Absolute: 0.6 10*3/uL (ref 0.1–1.0)
Monocytes Relative: 10 %
Neutro Abs: 2.9 10*3/uL (ref 1.7–7.7)
Neutrophils Relative %: 51 %
Platelets: 307 10*3/uL (ref 150–400)
RBC: 4.39 MIL/uL (ref 4.22–5.81)
RDW: 12.3 % (ref 11.5–15.5)
WBC: 5.8 10*3/uL (ref 4.0–10.5)
nRBC: 0 % (ref 0.0–0.2)

## 2023-09-17 LAB — CBG MONITORING, ED: Glucose-Capillary: 78 mg/dL (ref 70–99)

## 2023-09-17 LAB — COMPREHENSIVE METABOLIC PANEL WITH GFR
ALT: 22 U/L (ref 0–44)
AST: 25 U/L (ref 15–41)
Albumin: 4 g/dL (ref 3.5–5.0)
Alkaline Phosphatase: 54 U/L (ref 38–126)
Anion gap: 9 (ref 5–15)
BUN: 13 mg/dL (ref 6–20)
CO2: 26 mmol/L (ref 22–32)
Calcium: 9.2 mg/dL (ref 8.9–10.3)
Chloride: 106 mmol/L (ref 98–111)
Creatinine, Ser: 0.86 mg/dL (ref 0.61–1.24)
GFR, Estimated: >60 mL/min (ref >60)
Glucose, Bld: 113 mg/dL — ABNORMAL HIGH (ref 70–99)
Potassium: 3.7 mmol/L (ref 3.5–5.1)
Sodium: 141 mmol/L (ref 135–145)
Total Bilirubin: 0.5 mg/dL (ref 0.0–1.2)
Total Protein: 7.5 g/dL (ref 6.5–8.1)

## 2023-09-17 LAB — URINALYSIS, ROUTINE W REFLEX MICROSCOPIC
Bilirubin Urine: NEGATIVE
Glucose, UA: NEGATIVE mg/dL
Hgb urine dipstick: NEGATIVE
Ketones, ur: NEGATIVE mg/dL
Leukocytes,Ua: NEGATIVE
Nitrite: NEGATIVE
Protein, ur: NEGATIVE mg/dL
Specific Gravity, Urine: 1.013 (ref 1.005–1.030)
pH: 7 (ref 5.0–8.0)

## 2023-09-17 LAB — URINE DRUG SCREEN, QUALITATIVE (ARMC ONLY)
Amphetamines, Ur Screen: NOT DETECTED
Barbiturates, Ur Screen: NOT DETECTED
Benzodiazepine, Ur Scrn: NOT DETECTED
Cannabinoid 50 Ng, Ur ~~LOC~~: NOT DETECTED
Cocaine Metabolite,Ur ~~LOC~~: NOT DETECTED
MDMA (Ecstasy)Ur Screen: NOT DETECTED
Methadone Scn, Ur: NOT DETECTED
Opiate, Ur Screen: NOT DETECTED
Phencyclidine (PCP) Ur S: NOT DETECTED
Tricyclic, Ur Screen: POSITIVE — AB

## 2023-09-17 MED ORDER — DIVALPROEX SODIUM 250 MG PO DR TAB: 250.0000 mg | DELAYED_RELEASE_TABLET | Freq: Every morning | ORAL | Status: DC

## 2023-09-17 MED ORDER — PALIPERIDONE ER 3 MG PO TB24
9.0000 mg | ORAL_TABLET | Freq: Every day | ORAL | Status: DC
  Administered 2023-09-18: 9 mg via ORAL
  Filled 2023-09-17: qty 3

## 2023-09-17 MED ORDER — PROPRANOLOL HCL 20 MG PO TABS
10.0000 mg | ORAL_TABLET | Freq: Every day | ORAL | Status: DC
  Administered 2023-09-17 – 2023-09-19 (×3): 10 mg via ORAL
  Filled 2023-09-17 (×3): qty 0.5

## 2023-09-17 MED ORDER — ACETAMINOPHEN 500 MG PO TABS: 500.0000 mg | ORAL_TABLET | Freq: Four times a day (QID) | ORAL | Status: DC | PRN

## 2023-09-17 MED ORDER — LEVOTHYROXINE SODIUM 50 MCG PO TABS
25.0000 ug | ORAL_TABLET | Freq: Every day | ORAL | Status: DC
  Administered 2023-09-18 – 2023-09-19 (×2): 25 ug via ORAL
  Filled 2023-09-17 (×2): qty 1

## 2023-09-17 MED ORDER — MELATONIN 5 MG PO TABS
5.0000 mg | ORAL_TABLET | Freq: Every evening | ORAL | Status: DC | PRN
  Administered 2023-09-18: 5 mg via ORAL
  Filled 2023-09-17: qty 1

## 2023-09-17 MED ORDER — DIVALPROEX SODIUM 500 MG PO DR TAB: 500.0000 mg | DELAYED_RELEASE_TABLET | Freq: Every morning | ORAL | Status: DC

## 2023-09-17 MED ORDER — LORATADINE 10 MG PO TABS
10.0000 mg | ORAL_TABLET | Freq: Every day | ORAL | Status: DC
  Administered 2023-09-17 – 2023-09-18 (×2): 10 mg via ORAL
  Filled 2023-09-17 (×2): qty 1

## 2023-09-17 MED ORDER — DOCUSATE SODIUM 100 MG PO CAPS
100.0000 mg | ORAL_CAPSULE | Freq: Every day | ORAL | Status: DC
  Administered 2023-09-19: 100 mg via ORAL
  Filled 2023-09-17: qty 1

## 2023-09-17 MED ORDER — AZELASTINE HCL 0.1 % NA SOLN: 2.0000 | NASAL | Status: DC | PRN

## 2023-09-17 MED ORDER — ENOXAPARIN SODIUM 40 MG/0.4ML IJ SOSY
40.0000 mg | PREFILLED_SYRINGE | INTRAMUSCULAR | Status: DC
  Administered 2023-09-17 – 2023-09-18 (×2): 40 mg via SUBCUTANEOUS
  Filled 2023-09-17 (×2): qty 0.4

## 2023-09-17 MED ORDER — ORAL CARE MOUTH RINSE
15.0000 mL | OROMUCOSAL | Status: DC
  Administered 2023-09-18: 15 mL via OROMUCOSAL
  Filled 2023-09-17 (×6): qty 15

## 2023-09-17 MED ORDER — LACOSAMIDE 50 MG PO TABS
200.0000 mg | ORAL_TABLET | Freq: Two times a day (BID) | ORAL | Status: DC
  Administered 2023-09-17 – 2023-09-19 (×4): 200 mg via ORAL
  Filled 2023-09-17 (×4): qty 4

## 2023-09-17 MED ORDER — DIVALPROEX SODIUM 250 MG PO DR TAB: 250.0000 mg | DELAYED_RELEASE_TABLET | Freq: Every day | ORAL | Status: DC

## 2023-09-17 MED ORDER — DIVALPROEX SODIUM 500 MG PO DR TAB
500.0000 mg | DELAYED_RELEASE_TABLET | Freq: Two times a day (BID) | ORAL | Status: DC
  Administered 2023-09-18 – 2023-09-19 (×3): 500 mg via ORAL
  Filled 2023-09-17 (×3): qty 1

## 2023-09-17 MED ORDER — FLUTICASONE PROPIONATE 50 MCG/ACT NA SUSP: 2.0000 | Freq: Every day | NASAL | Status: DC | PRN

## 2023-09-17 MED ORDER — LORAZEPAM 2 MG/ML IJ SOLN: 4.0000 mg | INTRAMUSCULAR | Status: DC | PRN

## 2023-09-17 MED ORDER — DIVALPROEX SODIUM 500 MG PO DR TAB: 500.0000 mg | DELAYED_RELEASE_TABLET | Freq: Every day | ORAL | Status: DC

## 2023-09-17 MED ORDER — FENOFIBRATE 54 MG PO TABS
54.0000 mg | ORAL_TABLET | Freq: Every day | ORAL | Status: DC
  Administered 2023-09-18 – 2023-09-19 (×2): 54 mg via ORAL
  Filled 2023-09-17 (×2): qty 1

## 2023-09-17 MED ORDER — PANTOPRAZOLE SODIUM 40 MG PO TBEC
40.0000 mg | DELAYED_RELEASE_TABLET | Freq: Every day | ORAL | Status: DC
  Administered 2023-09-18 – 2023-09-19 (×2): 40 mg via ORAL
  Filled 2023-09-17 (×2): qty 1

## 2023-09-17 MED ORDER — MIRTAZAPINE 15 MG PO TABS
15.0000 mg | ORAL_TABLET | Freq: Every day | ORAL | Status: DC
  Administered 2023-09-17 – 2023-09-18 (×2): 15 mg via ORAL
  Filled 2023-09-17 (×2): qty 1

## 2023-09-17 MED ORDER — ONDANSETRON HCL 4 MG PO TABS: 4.0000 mg | ORAL_TABLET | Freq: Four times a day (QID) | ORAL | Status: DC | PRN

## 2023-09-17 MED ORDER — MIRABEGRON ER 50 MG PO TB24
50.0000 mg | ORAL_TABLET | Freq: Every day | ORAL | Status: DC
  Administered 2023-09-18 – 2023-09-19 (×2): 50 mg via ORAL
  Filled 2023-09-17 (×2): qty 1

## 2023-09-17 MED ORDER — SODIUM CHLORIDE 0.9% FLUSH: 3.0000 mL | INTRAVENOUS | Status: DC | PRN

## 2023-09-17 MED ORDER — ONDANSETRON HCL 4 MG/2ML IJ SOLN: 4.0000 mg | Freq: Four times a day (QID) | INTRAMUSCULAR | Status: DC | PRN

## 2023-09-17 MED ORDER — SERTRALINE HCL 50 MG PO TABS
100.0000 mg | ORAL_TABLET | Freq: Every day | ORAL | Status: DC
  Administered 2023-09-18: 100 mg via ORAL
  Filled 2023-09-17: qty 2

## 2023-09-17 MED ORDER — SODIUM CHLORIDE 0.9% FLUSH
3.0000 mL | Freq: Two times a day (BID) | INTRAVENOUS | Status: DC
  Administered 2023-09-17 – 2023-09-19 (×4): 10 mL via INTRAVENOUS

## 2023-09-17 MED ORDER — DIVALPROEX SODIUM 250 MG PO DR TAB: 500.0000 mg | DELAYED_RELEASE_TABLET | Freq: Every day | ORAL | Status: DC

## 2023-09-17 MED ORDER — ORAL CARE MOUTH RINSE: 15.0000 mL | OROMUCOSAL | Status: DC | PRN

## 2023-09-17 MED ORDER — SERTRALINE HCL 50 MG PO TABS: 100.0000 mg | ORAL_TABLET | Freq: Every day | ORAL | Status: DC

## 2023-09-17 MED ORDER — ALBUTEROL SULFATE (2.5 MG/3ML) 0.083% IN NEBU: 2.5000 mg | INHALATION_SOLUTION | Freq: Four times a day (QID) | RESPIRATORY_TRACT | Status: DC | PRN

## 2023-09-17 NOTE — ED Notes (Signed)
 RN unable to get in contact with legal guardian to make aware of admission. No answer after 2 attempts.

## 2023-09-17 NOTE — ED Triage Notes (Addendum)
 Patient to ED from [facility]. Pt was at an appointment when he had a seizure. Seizure occurred approx 25 minutes ago. Pt post-ictal at this time. Seen for same on 5/7.   Per group home representative, pt's legal guardian aware of patient in ED.

## 2023-09-17 NOTE — ED Notes (Signed)
 Called dietary for supper tray. It is actually on the way.

## 2023-09-17 NOTE — ED Notes (Signed)
 Second red top sent to lab. Hospitalist was at bedside. Pt is alert, talking and joking.

## 2023-09-17 NOTE — ED Notes (Signed)
Provided sandwich tray.

## 2023-09-17 NOTE — ED Notes (Addendum)
 Pt just seen here earlier for seizures. Pt was dc and family had pt in car and had left the hospital. Pt then began to have another focal seizures. Pt not responding to this RN. Pt staring off in space. Pt is on meds for his seizures. Charge RN Shelvy Dickens called for assistance. Pt not responding with other staff at first. Pt then able to stand and get out of car to stretcher.

## 2023-09-17 NOTE — ED Provider Notes (Signed)
 [facility] Provider Note   Event Date/Time   First MD Initiated Contact with Patient 09/17/23 1401     (approximate) History  Seizures  HPI Eddie Berry is a 38 y.o. male with a past medical history of traumatic brain injury with subsequent seizure activity who presents after an episode of seizure activity at a PCP appointment today.  Patient was postictal per staff on scene for approximately 25 minutes.  Patient states that he does not remember the events of yesterday however does remember this morning.  Patient denies any medication nonadherence this his medications are given to him.  Patient states that he has had change in his medications recently as he was seen on 09/10/2023 for similar symptoms. ROS: Patient currently denies any vision changes, tinnitus, difficulty speaking, facial droop, sore throat, chest pain, shortness of breath, abdominal pain, nausea/vomiting/diarrhea, dysuria, or weakness/numbness/paresthesias in any extremity   Physical Exam  Triage Vital Signs: ED Triage Vitals  Encounter Vitals Group     BP 09/17/23 1405 112/85     Systolic BP Percentile --      Diastolic BP Percentile --      Pulse Rate 09/17/23 1405 (!) 103     Resp 09/17/23 1405 17     Temp 09/17/23 1405 98.3 F (36.8 C)     Temp Source 09/17/23 1405 Oral     SpO2 09/17/23 1405 94 %     Weight 09/17/23 1402 222 lb 10.6 oz (101 kg)     Height 09/17/23 1402 6' (1.829 m)     Head Circumference --      Peak Flow --      Pain Score 09/17/23 1402 0     Pain Loc --      Pain Education --      Exclude from Growth Chart --    Most recent vital signs: Vitals:   09/17/23 1405 09/17/23 1532  BP: 112/85 121/80  Pulse: (!) 103 98  Resp: 17 18  Temp: 98.3 F (36.8 C) 98.3 F (36.8 C)  SpO2: 94% 100%   General: Awake, oriented x4. CV:  Good peripheral perfusion.  Resp:  Normal effort.  Abd:  No distention.  Other:  Middle-aged obese Caucasian male resting comfortably  in no acute distress ED Results / Procedures / Treatments  Labs (all labs ordered are listed, but only abnormal results are displayed) Labs Reviewed  COMPREHENSIVE METABOLIC PANEL WITH GFR - Abnormal; Notable for the following components:      Result Value   Glucose, Bld 113 (*)    All other components within normal limits  URINALYSIS, ROUTINE W REFLEX MICROSCOPIC - Abnormal; Notable for the following components:   Color, Urine YELLOW (*)    APPearance CLOUDY (*)    All other components within normal limits  URINE DRUG SCREEN, QUALITATIVE ([facility] ONLY) - Abnormal; Notable for the following components:   Tricyclic, Ur Screen POSITIVE (*)    All other components within normal limits  CBC WITH DIFFERENTIAL/PLATELET   EKG ED ECG REPORT I, Charleen Conn, the attending physician, personally viewed and interpreted this ECG. Date: 09/17/2023 EKG Time: 1406 Rate: 103 Rhythm: Tachycardic sinus rhythm QRS Axis: normal Intervals: normal ST/T Wave abnormalities: normal Narrative Interpretation: Tachycardic sinus rhythm.  No evidence of acute ischemia PROCEDURES: Critical Care performed: No .1-3 Lead EKG Interpretation  Performed by: Charleen Conn, MD Authorized by: Charleen Conn, MD     Interpretation: normal     ECG  rate:  91   ECG rate assessment: normal     Rhythm: sinus rhythm     Ectopy: none     Conduction: normal    MEDICATIONS ORDERED IN ED: Medications - No data to display IMPRESSION / MDM / ASSESSMENT AND PLAN / ED COURSE  I reviewed the triage vital signs and the nursing notes.                             The patient is on the cardiac monitor to evaluate for evidence of arrhythmia and/or significant heart rate changes. Patient's presentation is most consistent with acute presentation with potential threat to life or bodily function. Patient presents after recent seizure episode.  Patient had slow return to baseline mental and physical function per caregiver. No  immunosuppresion hx and had no preceding fever. No history of alcohol  abuse or suspicion for toxin ingestion. Unlikely stroke, syncope. Unlikely infectious etiology. No preceding trauma.  Workup: EKG, BMP, POCT glucose   Field Interventions: None ED Interventions: None Patient's medication changes have been made by his primary just prior to urgency department visit and patient was informed to follow this new regimen Disposition: Discharge home with primary care follow up in next 24-48 hours.   FINAL CLINICAL IMPRESSION(S) / ED DIAGNOSES   Final diagnoses:  Seizure-like activity (HCC)   Rx / DC Orders   ED Discharge Orders     None      Note:  This document was prepared using Dragon voice recognition software and may include unintentional dictation errors.   Muath Hallam K, MD 09/18/23 [phone]

## 2023-09-17 NOTE — ED Notes (Signed)
 EDP will inform this RN if bloodwork needed since was done today already.

## 2023-09-17 NOTE — ED Notes (Signed)
 Pt was given 2 chocolate milks.

## 2023-09-17 NOTE — ED Triage Notes (Signed)
 Pt to ED POV with caregiver from [facility] for possible seizure, seen here today for same. Caregiver at bedside who states pt had absence seizure in the car--no shaking. Caregiver states that this happens in "seasons". States pt has been "in a daze" today.pt currently nonverbal. Appears to be having mild nystagmus. Caregiver states pt has been taking all his meds. Hx TBI.

## 2023-09-17 NOTE — ED Provider Notes (Signed)
 [facility] Provider Note    Event Date/Time   First MD Initiated Contact with Patient 09/17/23 1634     (approximate)  History   Chief Complaint: Seizures  HPI  Eddie Berry is a 38 y.o. male with a past medical history of autism, seizure disorder, TBI, presents to the emergency department for possible seizure-like activity.  Patient is here with his group home caregiver.  Patient had a neurology appointment earlier today and while at the office had an episode consistent with an absence seizure in which the patient stares off and becomes unresponsive.  Patient had a postictal state following this.  Patient was seen in the emergency department had returned back to his baseline and was discharged.  Group home provider states they were in the car when the patient began staring off and stopped responding.  They brought the patient back to the emergency department.  Here the patient will look at you when you talk to him but he is not answering questions, not talking.  No concern for ongoing seizure activity.  Physical Exam   Triage Vital Signs: ED Triage Vitals  Encounter Vitals Group     BP 09/17/23 1640 (!) 122/97     Systolic BP Percentile --      Diastolic BP Percentile --      Pulse Rate 09/17/23 1639 96     Resp 09/17/23 1639 (!) 25     Temp 09/17/23 1639 98.5 F (36.9 C)     Temp Source 09/17/23 1639 Oral     SpO2 09/17/23 1639 95 %     Weight 09/17/23 1636 200 lb (90.7 kg)     Height 09/17/23 1636 6' (1.829 m)     Head Circumference --      Peak Flow --      Pain Score --      Pain Loc --      Pain Education --      Exclude from Growth Chart --     Most recent vital signs: Vitals:   09/17/23 1640 09/17/23 1641  BP: (!) 122/97 (!) 122/97  Pulse: 94   Resp: 19   Temp:    SpO2: 95%     General: Awake, no distress, will look into when you talk to him we will follow you with his eyes.  Was not responding to verbal questioning.  Not  following commands. CV:  Good peripheral perfusion.  Regular rate and rhythm  Resp:  Normal effort.  Equal breath sounds bilaterally.  Abd:  No distention.  Soft, nontender.  No rebound or guarding.  ED Results / Procedures / Treatments   EKG  EKG viewed and interpreted by myself shows a sinus rhythm at 95 bpm with a narrow QRS, normal axis, normal intervals, no concerning ST changes.  MEDICATIONS ORDERED IN ED: Medications - No data to display   IMPRESSION / MDM / ASSESSMENT AND PLAN / ED COURSE  I reviewed the triage vital signs and the nursing notes.  Patient's presentation is most consistent with acute presentation with potential threat to life or bodily function.  Patient presents to the emergency department for additional seizure-like activity.  This is the patient's second or possibly third episode today per caregiver.  States prior to this it has been's or longer several months or longer since the patient's last episode.  Patient had lab work performed earlier today showing a reassuring CBC and chemistry as well as urinalysis and drug screen.  However as the patient has had 2 possibly 3 episodes of seizure activity the group home provider was not comfortable monitoring the patient at home, I spoke to Dr. Renaee Caro of neurology.  We will admit for ongoing monitoring and neurologic consultation.  Will discuss with the hospitalist.  FINAL CLINICAL IMPRESSION(S) / ED DIAGNOSES   Seizure activity   Note:  This document was prepared using Dragon voice recognition software and may include unintentional dictation errors.   Ruth Cove, MD 09/17/23 1726

## 2023-09-17 NOTE — ED Notes (Signed)
 Tried to call [facility], put on long hold. Will call back.

## 2023-09-17 NOTE — ED Notes (Signed)
Pt appears more alert  

## 2023-09-17 NOTE — ED Notes (Signed)
 Pt requested drink. Provided cola. No bloodwork needed at this time.

## 2023-09-17 NOTE — Discharge Instructions (Addendum)
 Please follow-up with your neurologist for recheck of your antiseizure medication levels.

## 2023-09-17 NOTE — H&P (Signed)
 History and Physical    Nahsir Berry UJW:[id number] DOB: 1985/11/17 DOA: 09/17/2023  PCP: Carlean Charter, DO (Confirm with patient/family/NH records and if not entered, this has to be entered at [facility] point of entry) Patient coming from: Group home  I have personally briefly reviewed patient's old medical records in [facility]  Chief Complaint: Seizure  HPI: Eddie Berry is a 38 y.o. male with medical history significant of TBI, seizure disorder, schizophrenia, cigar smoking, asthma, presented with seizure.  Patient is postictal and does not remember much of the event, all history obtained by reviewing of ED record and discussion with ED physician.  Patient went to have a routine neurology follow-up this morning, and neurology had the plan to cut down some of his seizure medications due to side effect of daytime lethargy.  During the visit however patient became unresponsive with eyes wide open and staring forward for about 10-15 minutes.  And patient sent to ED for the evaluation.  During ED stay, patient had another similar episode when he suddenly stopped responding and staring at all for 10 minutes.  After that, patient appears to be back to his baseline and ED discharge him back to group home.  During the ride however patient had a third episode of staring and not responding and was driven back to ED.  According to patient's caregiver, he has been taking his seizure medications which were provided through bubble packaging.    Review of Systems: As per HPI otherwise 14 point review of systems negative.    Past Medical History:  Diagnosis Date   Allergy    Asthma    Autism    Blood transfusion without reported diagnosis    Seizures (HCC)    TBI (traumatic brain injury) (HCC)     Past Surgical History:  Procedure Laterality Date   ANKLE SURGERY     APPENDECTOMY     BRAIN SURGERY     LUNG SURGERY       reports that he quit smoking about 3 years ago. His smoking  use included cigarettes. He started smoking about 25 years ago. He has a 22 pack-year smoking history. He has never used smokeless tobacco. He reports that he does not currently use alcohol . He reports that he does not currently use drugs.  Allergies  Allergen Reactions   Antihistamines, Diphenhydramine-Type Other (See Comments)    UNK reaction   Nsaids Other (See Comments)    Unk reaction   Prednisone Other (See Comments)    UNK reaction   Aspirin Rash   Penicillins Rash    History reviewed. No pertinent family history.   Prior to Admission medications   Medication Sig Start Date End Date Taking? Authorizing Provider  acetaminophen  (TYLENOL ) 500 MG tablet Take 500 mg by mouth every 6 (six) hours as needed for mild pain (pain score 1-3), moderate pain (pain score 4-6), fever or headache.    [provider]  albuterol  (VENTOLIN  HFA) 108 (90 Base) MCG/ACT inhaler Inhale 2 puffs into the lungs every 6 (six) hours as needed for wheezing or shortness of breath.    [provider]  azelastine (ASTELIN) 0.1 % nasal spray Place 2 sprays into both nostrils as needed for rhinitis. Use in each nostril as directed    [provider]  cetirizine (ZYRTEC) 10 MG tablet Take 10 mg by mouth daily.    [provider]  Cholecalciferol  (VITAMIN D3) 50 MCG (2000 UT) TABS Take 1 tablet by  mouth daily.    [provider]  divalproex  (DEPAKOTE ) 250 MG DR tablet Take 1 tablet (250 mg total) by mouth at bedtime. 08/28/23 11/26/23  Kandee Orion, MD  divalproex  (DEPAKOTE ) 500 MG DR tablet Take 1 tablet (500 mg total) by mouth in the morning. 08/28/23 11/26/23  Kandee Orion, MD  docusate sodium  (COLACE) 100 MG capsule Take 1 capsule (100 mg total) by mouth daily. 08/04/23   Carlean Charter, DO  docusate sodium  (COLACE) 100 MG capsule Take 1 capsule (100 mg total) by mouth daily as needed for mild constipation. 08/04/23   Carlean Charter, DO  fenofibrate  (TRICOR ) 48 MG tablet  Take 48 mg by mouth daily.    [provider]  fluticasone  (FLONASE ) 50 MCG/ACT nasal spray Place 2 sprays into both nostrils daily. 09/08/23   Carlean Charter, DO  hydroxypropyl methylcellulose / hypromellose (ISOPTO TEARS / GONIOVISC) 2.5 % ophthalmic solution Place 2 drops into both eyes 2 (two) times daily. 08/04/23   Carlean Charter, DO  lacosamide  (VIMPAT ) 200 MG TABS tablet Take 1 tablet (200 mg total) by mouth 2 (two) times daily. 01/21/23   Bryson Carbine, MD  levothyroxine  (SYNTHROID ) 25 MCG tablet Take 25 mcg by mouth daily before breakfast.    [provider]  Melatonin 10 MG TABS Take 1 tablet by mouth at bedtime.    [provider]  mirabegron  ER (MYRBETRIQ ) 50 MG TB24 tablet Take 50 mg by mouth daily.    [provider]  mirtazapine (REMERON) 15 MG tablet Take 15 mg by mouth at bedtime.    [provider]  Multiple Vitamin (THEREMS PO) Take 1 tablet by mouth daily.    [provider]  omeprazole (PRILOSEC) 20 MG capsule Take 20 mg by mouth daily.    [provider]  paliperidone  (INVEGA ) 6 MG 24 hr tablet Take 9 mg by mouth daily.    [provider]  propranolol  (INDERAL ) 10 MG tablet Take 10 mg by mouth daily.    [provider]  sertraline  (ZOLOFT ) 100 MG tablet Take 100 mg by mouth daily.    [provider]    Physical Exam: Vitals:   09/17/23 1639 09/17/23 1640 09/17/23 1641 09/17/23 1730  BP:  (!) 122/97 (!) 122/97 (!) 128/104  Pulse: 96 94  94  Resp: (!) 25 19  19   Temp: 98.5 F (36.9 C)     TempSrc: Oral     SpO2: 95% 95%  97%  Weight:      Height:        Constitutional: NAD, calm, comfortable Vitals:   09/17/23 1639 09/17/23 1640 09/17/23 1641 09/17/23 1730  BP:  (!) 122/97 (!) 122/97 (!) 128/104  Pulse: 96 94  94  Resp: (!) 25 19  19   Temp: 98.5 F (36.9 C)     TempSrc: Oral     SpO2: 95% 95%  97%  Weight:      Height:       Eyes: PERRL, lids and conjunctivae  normal ENMT: Mucous membranes are moist. Posterior pharynx clear of any exudate or lesions.Normal dentition.  Neck: normal, supple, no masses, no thyromegaly Respiratory: clear to auscultation bilaterally, no wheezing, no crackles. Normal respiratory effort. No accessory muscle use.  Cardiovascular: Regular rate and rhythm, no murmurs / rubs / gallops. No extremity edema. 2+ pedal pulses. No carotid bruits.  Abdomen: no tenderness, no masses palpated. No hepatosplenomegaly. Bowel sounds positive.  Musculoskeletal: no clubbing /  cyanosis. No joint deformity upper and lower extremities. Good ROM, no contractures. Normal muscle tone.  Skin: no rashes, lesions, ulcers. No induration Neurologic: CN 2-12 grossly intact. Sensation intact, DTR normal. Strength 5/5 in all 4.  Psychiatric: Normal judgment and insight. Alert and oriented x 3. Normal mood.    Labs on Admission: I have personally reviewed following labs and imaging studies  CBC: Recent Labs  Lab 09/17/23 1415  WBC 5.8  NEUTROABS 2.9  HGB 13.9  HCT 41.1  MCV 93.6  PLT 307   Basic Metabolic Panel: Recent Labs  Lab 09/17/23 1415  NA 141  K 3.7  CL 106  CO2 26  GLUCOSE 113*  BUN 13  CREATININE 0.86  CALCIUM 9.2   GFR: Estimated Creatinine Clearance: 125.3 mL/min (by C-G formula based on SCr of 0.86 mg/dL). Liver Function Tests: Recent Labs  Lab 09/17/23 1415  AST 25  ALT 22  ALKPHOS 54  BILITOT 0.5  PROT 7.5  ALBUMIN 4.0   No results for input(s): "LIPASE", "AMYLASE" in the last 168 hours. No results for input(s): "AMMONIA" in the last 168 hours. Coagulation Profile: No results for input(s): "INR", "PROTIME" in the last 168 hours. Cardiac Enzymes: No results for input(s): "CKTOTAL", "CKMB", "CKMBINDEX", "TROPONINI" in the last 168 hours. BNP (last 3 results) No results for input(s): "PROBNP" in the last 8760 hours. HbA1C: No results for input(s): "HGBA1C" in the last 72 hours. CBG: Recent Labs  Lab  09/17/23 1650  GLUCAP 78   Lipid Profile: No results for input(s): "CHOL", "HDL", "LDLCALC", "TRIG", "CHOLHDL", "LDLDIRECT" in the last 72 hours. Thyroid  Function Tests: No results for input(s): "TSH", "T4TOTAL", "FREET4", "T3FREE", "THYROIDAB" in the last 72 hours. Anemia Panel: No results for input(s): "VITAMINB12", "FOLATE", "FERRITIN", "TIBC", "IRON", "RETICCTPCT" in the last 72 hours. Urine analysis:    Component Value Date/Time   COLORURINE YELLOW (A) 09/17/2023 1415   APPEARANCEUR CLOUDY (A) 09/17/2023 1415   LABSPEC 1.013 09/17/2023 1415   PHURINE 7.0 09/17/2023 1415   GLUCOSEU NEGATIVE 09/17/2023 1415   HGBUR NEGATIVE 09/17/2023 1415   BILIRUBINUR NEGATIVE 09/17/2023 1415   KETONESUR NEGATIVE 09/17/2023 1415   PROTEINUR NEGATIVE 09/17/2023 1415   NITRITE NEGATIVE 09/17/2023 1415   LEUKOCYTESUR NEGATIVE 09/17/2023 1415    Radiological Exams on Admission: No results found.  EKG: Sinus rhythm, no acute ST changes.  Assessment/Plan Principal Problem:   Seizure (HCC)  (please populate well all problems here in Problem List. (For example, if patient is on BP meds at home and you resume or decide to hold them, it is a problem that needs to be her. Same for CAD, COPD, HLD and so on)  AMS Likely absence seizure -probably breakthrough seizure in nature -Neurology recommended overnight observation.  Full neurology consultation to follow -As per neurologist recommendation, continue Depakote  and Vimpat  on current dose - No reported for or trauma, hold off further brain image study  History of schizophrenia -No acute concern - Continue Invega   Mild intermittent asthma -No acute concern -Continue albuterol  as needed  DVT prophylaxis: Lovenox  Code Status: Full code Family Communication: None at bedside Disposition Plan: Expect less than 2 midnight hospital stay Consults called: Neurology Admission status: Telemetry observation   Frank Island MD [facility] Pager [phone]  09/17/2023, 5:52 PM

## 2023-09-17 NOTE — Consult Note (Signed)
 NEUROLOGY CONSULT NOTE   Date of service: Sep 17, 2023 Patient Name: Eddie Berry MRN:  [id number] DOB:  Sep 18, 1985 Chief Complaint: Breakthrough seizures Requesting Provider: Frank [place], MD  History of Present Illness  Eddie Berry is a 38 y.o. male with a PMHx of TBI with residual right sided deficits, autism and seizures, who presents to the ED from his group home after multiple breakthrough seizures. He had been discharged from the hospital on 4/21 after a 5 day admission following breakthrough seizure activity. At that time the patient had been seated in a chair at his group home; when his caregiver returned back to him he was poorly responsive and sitting up in the chair without any tremulous or seizure-like activity to the extremities - he continued to be unresponsive for a few minutes before regaining consciousness. CBC, metabolic panel and UA were normal. His VPA level was subherapeutic during that admission at 32. EEG was normal.   He was then seen again for a breakthrough seizure on 5/7. He had denied missing any doses of his seizure medications at that time. Lacosamide  level from 5/7 came back therapeutic at 6.5 (therapeutic range is 5-10) and VPA level was at the low end of the therapeutic range at 52. He also had a normal CBC, metabolic panel and UA. He was discharged with recommendation to continue his VPA and lacosamide , and to follow up with his outpatient Neurologist.   He then had another seizure earlier this afternoon. He was back to baseline in the ED, so he was discharged home. He then went to the [facility] for follow up appointment. While at the office he had an episode consistent with an absence-like seizure in which he stared off and became unresponsive. Patient had a postictal state following this. He was sent back to the ED. When he was back in the car with caretaker, he started to have another focal seizure, being unresponsive and staring off in space. He  was then able to stand and get out of the car and onto a stretcher. His caregiver stated that this happens in "seasons" and that the patient has been "in a daze" today. Per Triage RN note, the patient appeared to be having mild nystagmus. His caregiver stated that the patient has been taking all of his meds.  The patient currently does not have any complaints, including no pain, recurrent seizure while in the ED, or headache.    ROS  Comprehensive ROS performed and pertinent positives documented in HPI    Past History   Past Medical History:  Diagnosis Date   Allergy    Asthma    Autism    Blood transfusion without reported diagnosis    Seizures (HCC)    TBI (traumatic brain injury) (HCC)     Past Surgical History:  Procedure Laterality Date   ANKLE SURGERY     APPENDECTOMY     BRAIN SURGERY     LUNG SURGERY      Family History: History reviewed. No pertinent family history.  Social History  reports that he quit smoking about 3 years ago. His smoking use included cigarettes. He started smoking about 25 years ago. He has a 22 pack-year smoking history. He has never used smokeless tobacco. He reports that he does not currently use alcohol . He reports that he does not currently use drugs.  Allergies  Allergen Reactions   Antihistamines, Diphenhydramine-Type Other (See Comments)    UNK reaction   Nsaids Other (See  Comments)    Unk reaction   Prednisone Other (See Comments)    UNK reaction   Aspirin Rash   Penicillins Rash    Medications   Current Facility-Administered Medications:    acetaminophen  (TYLENOL ) tablet 500 mg, 500 mg, Oral, Q6H PRN, Zhang, Ping T, MD   albuterol  (PROVENTIL ) (2.5 MG/3ML) 0.083% nebulizer solution 2.5 mg, 2.5 mg, Inhalation, Q6H PRN, Antoniette Batty T, MD   azelastine (ASTELIN) 0.1 % nasal spray 2 spray, 2 spray, Each Nare, PRN, Frank [place], MD   [START ON 09/18/2023] divalproex  (DEPAKOTE ) DR tablet 250 mg, 250 mg, Oral, QHS, Zhang, Ping T, MD    [START ON 09/18/2023] divalproex  (DEPAKOTE ) DR tablet 500 mg, 500 mg, Oral, q AM, Frank [place], MD   [START ON 09/18/2023] docusate sodium  (COLACE) capsule 100 mg, 100 mg, Oral, Daily, Zhang, Herbie Loll T, MD   enoxaparin  (LOVENOX ) injection 40 mg, 40 mg, Subcutaneous, Q24H, Zhang, Ping T, MD   fenofibrate  tablet 54 mg, 54 mg, Oral, Daily, Zhang, Ping T, MD   fluticasone  (FLONASE ) 50 MCG/ACT nasal spray 2 spray, 2 spray, Each Nare, Daily PRN, Antoniette Batty T, MD   lacosamide  (VIMPAT ) tablet 200 mg, 200 mg, Oral, BID, Zhang, Ping T, MD   [START ON 09/18/2023] levothyroxine  (SYNTHROID ) tablet 25 mcg, 25 mcg, Oral, QAC breakfast, Antoniette Batty T, MD   loratadine  (CLARITIN ) tablet 10 mg, 10 mg, Oral, Daily, Zhang, Herbie Loll T, MD   LORazepam  (ATIVAN ) injection 4 mg, 4 mg, Intravenous, Q5 Min x 2 PRN, Antoniette Batty T, MD   melatonin tablet 5 mg, 5 mg, Oral, QHS PRN, [place], MD   mirabegron  ER (MYRBETRIQ ) tablet 50 mg, 50 mg, Oral, Daily, Zhang, Herbie Loll T, MD   mirtazapine (REMERON) tablet 15 mg, 15 mg, Oral, QHS, Zhang, Ping T, MD   ondansetron  (ZOFRAN ) tablet 4 mg, 4 mg, Oral, Q6H PRN **OR** ondansetron  (ZOFRAN ) injection 4 mg, 4 mg, Intravenous, Q6H PRN, Frank [place], MD   Oral care mouth rinse, 15 mL, Mouth Rinse, Q2H, Zhang, Brooke Canton, MD   Oral care mouth rinse, 15 mL, Mouth Rinse, PRN, [place], MD   paliperidone  (INVEGA ) 24 hr tablet 9 mg, 9 mg, Oral, Daily, Zhang, Ping T, MD   pantoprazole  (PROTONIX ) EC tablet 40 mg, 40 mg, Oral, Daily, Zhang, Herbie Loll T, MD   propranolol  (INDERAL ) tablet 10 mg, 10 mg, Oral, Daily, Zhang, Herbie Loll T, MD   sertraline  (ZOLOFT ) tablet 100 mg, 100 mg, Oral, Daily, Zhang, Herbie Loll T, MD   sodium chloride  flush (NS) 0.9 % injection 3-10 mL, 3-10 mL, Intravenous, Q12H, Zhang, Herbie Loll T, MD   sodium chloride  flush (NS) 0.9 % injection 3-10 mL, 3-10 mL, Intravenous, PRN, [place], MD  Current Outpatient Medications:    acetaminophen  (TYLENOL ) 500 MG tablet, Take 500 mg by mouth every 6  (six) hours as needed for mild pain (pain score 1-3), moderate pain (pain score 4-6), fever or headache., Disp: , Rfl:    albuterol  (VENTOLIN  HFA) 108 (90 Base) MCG/ACT inhaler, Inhale 2 puffs into the lungs every 6 (six) hours as needed for wheezing or shortness of breath., Disp: , Rfl:    azelastine (ASTELIN) 0.1 % nasal spray, Place 2 sprays into both nostrils as needed for rhinitis. Use in each nostril as directed, Disp: , Rfl:    cetirizine (ZYRTEC) 10 MG tablet, Take 10 mg by mouth daily., Disp: , Rfl:    Cholecalciferol  (VITAMIN D3) 50 MCG (2000 UT) TABS, Take  1 tablet by mouth daily., Disp: , Rfl:    divalproex  (DEPAKOTE ) 250 MG DR tablet, Take 1 tablet (250 mg total) by mouth at bedtime., Disp: 30 tablet, Rfl: 2   divalproex  (DEPAKOTE ) 500 MG DR tablet, Take 1 tablet (500 mg total) by mouth in the morning., Disp: 30 tablet, Rfl: 2   docusate sodium  (COLACE) 100 MG capsule, Take 1 capsule (100 mg total) by mouth daily., Disp: 30 capsule, Rfl: 11   docusate sodium  (COLACE) 100 MG capsule, Take 1 capsule (100 mg total) by mouth daily as needed for mild constipation., Disp: 30 capsule, Rfl: 11   fenofibrate  (TRICOR ) 48 MG tablet, Take 48 mg by mouth daily., Disp: , Rfl:    fluticasone  (FLONASE ) 50 MCG/ACT nasal spray, Place 2 sprays into both nostrils daily., Disp: 16 g, Rfl: 1   hydroxypropyl methylcellulose / hypromellose (ISOPTO TEARS / GONIOVISC) 2.5 % ophthalmic solution, Place 2 drops into both eyes 2 (two) times daily., Disp: 15 mL, Rfl: 4   lacosamide  (VIMPAT ) 200 MG TABS tablet, Take 1 tablet (200 mg total) by mouth 2 (two) times daily., Disp: 60 tablet, Rfl: 2   levothyroxine  (SYNTHROID ) 25 MCG tablet, Take 25 mcg by mouth daily before breakfast., Disp: , Rfl:    Melatonin 10 MG TABS, Take 1 tablet by mouth at bedtime., Disp: , Rfl:    mirabegron  ER (MYRBETRIQ ) 50 MG TB24 tablet, Take 50 mg by mouth daily., Disp: , Rfl:    mirtazapine (REMERON) 15 MG tablet, Take 15 mg by mouth at  bedtime., Disp: , Rfl:    Multiple Vitamin (THEREMS PO), Take 1 tablet by mouth daily., Disp: , Rfl:    omeprazole (PRILOSEC) 20 MG capsule, Take 20 mg by mouth daily., Disp: , Rfl:    paliperidone  (INVEGA ) 6 MG 24 hr tablet, Take 9 mg by mouth daily., Disp: , Rfl:    propranolol  (INDERAL ) 10 MG tablet, Take 10 mg by mouth daily., Disp: , Rfl:    sertraline  (ZOLOFT ) 100 MG tablet, Take 100 mg by mouth daily., Disp: , Rfl:   Vitals   Vitals:   09/17/23 1639 09/17/23 1640 09/17/23 1641 09/17/23 1730  BP:  (!) 122/97 (!) 122/97 (!) 128/104  Pulse: 96 94  94  Resp: (!) 25 19  19   Temp: 98.5 F (36.9 C)     TempSrc: Oral     SpO2: 95% 95%  97%  Weight:      Height:        Body mass index is 27.12 kg/m.  Physical Exam   Physical Exam HEENT- Scars to scalp from remote head trauma.  Lungs- Respirations unlabored Extremities- Warm and well-perfused  Neurological Examination Mental Status: Awake and alert. Oriented x 5. Moderately flattened affect with decreased prosodic (somewhat monotone) content to speech (likely chronic due to TBI). Otherwise, speech is fluent with intact naming and comprehension. Memory intact for recent and remote events. Able to recite the months of the year forwards and backwards without difficulty. Mild dysarthria.  Cranial Nerves: II: Temporal visual fields intact with no extinction to DSS. PERRL. III,IV, VI: No ptosis. EOMI with mildly saccadic quality of pursuits. No nystagmus. V: Temp sensation decreased on the right VII: Right facial droop involving the upper and lower quadrants with weakened eyelid closure on the right (chronic) VIII: Hearing intact to voice IX,X: No hypophonia or hoarseness XI: Symmetric shoulder shrug XII: Midline tongue extension Motor: RUE: 5/5 LUE: 5/5 RLE: 5/5 LLE: 5/5 Sensory: Temp sensation decreased to  RUE, normal to LUE and BLE.  Deep Tendon Reflexes: 2+ and symmetric bilateral biceps and brachioradialis. 2+ right  patellar, 1+ left patellar.   Cerebellar: No ataxia with FNF bilaterally, but slower on the right. RAM intact bilaterally, slower on the right. Subtle RUE action tremor also noted.  Gait: Able to stand with own power, without unsteadiness.   Labs/Imaging/Neurodiagnostic studies   CBC:  Recent Labs  Lab 09-24-2023 1415  WBC 5.8  NEUTROABS 2.9  HGB 13.9  HCT 41.1  MCV 93.6  PLT 307   Basic Metabolic Panel:  Lab Results  Component Value Date   NA 141 09/24/23   K 3.7 09/24/23   CO2 26 09-24-2023   GLUCOSE 113 (H) 2023/09/24   BUN 13 09-24-2023   CREATININE 0.86 09-24-2023   CALCIUM 9.2 09-24-23   GFRNONAA >60 09-24-2023   Lipid Panel: No results found for: "LDLCALC" HgbA1c:  Lab Results  Component Value Date   HGBA1C 5.6 05/16/2022   Urine Drug Screen:     Component Value Date/Time   LABOPIA NONE DETECTED 2023/09/24 1415   COCAINSCRNUR NONE DETECTED 09-24-23 1415   LABBENZ NONE DETECTED 09/24/23 1415   AMPHETMU NONE DETECTED 09-24-2023 1415   THCU NONE DETECTED 09-24-2023 1415   LABBARB NONE DETECTED 2023/09/24 1415    Alcohol  Level     Component Value Date/Time   ETH <10 08/21/2023 1739   INR  Lab Results  Component Value Date   INR 1.0 05/15/2022   APTT  Lab Results  Component Value Date   APTT 28 05/15/2022     ASSESSMENT  Torrean Lambrecht is a 38 y.o. male with a history of TBI and epilepsy presenting after multiple breakthrough complex partial seizures over the past several days. He endorses compliance with his AEDs Vimpat  and VPA.  - Exam reveals chronic right sided findings related to his TBI as well as a somewhat flattened affect with decreased prosodic content to his speech. He is fully oriented with good attention, concentration and memory.  - CT head from 4/24: Stable encephalomalacia in the bilateral frontal lobes, left temporal lobe and right cerebellum. - EEG from 4/18: Within normal limits.  - Recent VPA and Vimpat  levels were  therapeutic. The patient states that VPA makes him drowsy and cannot tolerate a dosage increase. He states that he does not have this problem with Vimpat .  - Current home dosage of VPA: 500 mg BID with recommendation from Dr. Walden Guise recently to lower to 500 mg qAM and 250 mg at bedtime due to patient feeling drowsy in the AM.  - Current home dose of Vimpat  is 200 mg BID, which is at the commonly accepted maximum daily dose.    RECOMMENDATIONS  - EEG (ordered) - Lacosamide  and levetiracetam  levels (ordered) - Inpatient seizure precautions - Will need to discuss outpatient seizure precautions with patient prior to discharge, including no driving until seizure-free for 6 months.  - Increasing his VPA dosing back to 500 mg BID.  - Will discuss with our Epileptologist at [facility] tomorrow the option of possibly increasing Vimpat  to a higher total daily dose (which would be out of the commonly accepted maximum, but his recent Vimpat  level was near the bottom of the therapeutic range) versus adding a 3rd anticonvulsant. Will also discuss the possible need for LTM EEG, which can only be performed at [facility].  ______________________________________________________________________    Hope Ly, Rimsha Trembley, MD [facility]

## 2023-09-18 ENCOUNTER — Observation Stay

## 2023-09-18 DIAGNOSIS — I2510 Atherosclerotic heart disease of native coronary artery without angina pectoris: Secondary | ICD-10-CM | POA: Diagnosis present

## 2023-09-18 DIAGNOSIS — Z79899 Other long term (current) drug therapy: Secondary | ICD-10-CM | POA: Diagnosis not present

## 2023-09-18 DIAGNOSIS — Z886 Allergy status to analgesic agent status: Secondary | ICD-10-CM | POA: Diagnosis not present

## 2023-09-18 DIAGNOSIS — Z880 Allergy status to penicillin: Secondary | ICD-10-CM | POA: Diagnosis not present

## 2023-09-18 DIAGNOSIS — E785 Hyperlipidemia, unspecified: Secondary | ICD-10-CM | POA: Diagnosis present

## 2023-09-18 DIAGNOSIS — J4489 Other specified chronic obstructive pulmonary disease: Secondary | ICD-10-CM | POA: Diagnosis present

## 2023-09-18 DIAGNOSIS — R569 Unspecified convulsions: Secondary | ICD-10-CM | POA: Diagnosis present

## 2023-09-18 DIAGNOSIS — G510 Bell's palsy: Secondary | ICD-10-CM | POA: Diagnosis present

## 2023-09-18 DIAGNOSIS — E039 Hypothyroidism, unspecified: Secondary | ICD-10-CM | POA: Diagnosis present

## 2023-09-18 DIAGNOSIS — G40909 Epilepsy, unspecified, not intractable, without status epilepticus: Secondary | ICD-10-CM

## 2023-09-18 DIAGNOSIS — Z87820 Personal history of traumatic brain injury: Secondary | ICD-10-CM | POA: Diagnosis not present

## 2023-09-18 DIAGNOSIS — F840 Autistic disorder: Secondary | ICD-10-CM | POA: Diagnosis present

## 2023-09-18 DIAGNOSIS — Z79890 Hormone replacement therapy (postmenopausal): Secondary | ICD-10-CM | POA: Diagnosis not present

## 2023-09-18 DIAGNOSIS — G40A09 Absence epileptic syndrome, not intractable, without status epilepticus: Secondary | ICD-10-CM | POA: Diagnosis present

## 2023-09-18 DIAGNOSIS — Z87891 Personal history of nicotine dependence: Secondary | ICD-10-CM | POA: Diagnosis not present

## 2023-09-18 LAB — HIV ANTIBODY (ROUTINE TESTING W REFLEX): HIV Screen 4th Generation wRfx: NONREACTIVE

## 2023-09-18 LAB — VITAMIN B12: Vitamin B-12: 303 pg/mL (ref 180–914)

## 2023-09-18 LAB — MAGNESIUM: Magnesium: 1.7 mg/dL (ref 1.7–2.4)

## 2023-09-18 MED ORDER — SERTRALINE HCL 50 MG PO TABS: 150.0000 mg | ORAL_TABLET | Freq: Every day | ORAL | Status: DC

## 2023-09-18 MED ORDER — CLOZAPINE 100 MG PO TABS
300.0000 mg | ORAL_TABLET | Freq: Every day | ORAL | Status: DC
  Administered 2023-09-18 – 2023-09-19 (×2): 300 mg via ORAL
  Filled 2023-09-18 (×2): qty 3

## 2023-09-18 MED ORDER — ZONISAMIDE 25 MG PO CAPS
50.0000 mg | ORAL_CAPSULE | Freq: Every day | ORAL | Status: DC
  Administered 2023-09-18 – 2023-09-19 (×2): 50 mg via ORAL
  Filled 2023-09-18 (×2): qty 2

## 2023-09-18 MED ORDER — PALIPERIDONE ER 3 MG PO TB24
12.0000 mg | ORAL_TABLET | Freq: Every day | ORAL | Status: DC
  Administered 2023-09-19: 12 mg via ORAL
  Filled 2023-09-18: qty 4

## 2023-09-18 NOTE — Plan of Care (Signed)
  Problem: Education: Goal: Knowledge of General Education information will improve Description: Including pain rating scale, medication(s)/side effects and non-pharmacologic comfort measures Outcome: Progressing   Problem: Health Behavior/Discharge Planning: Goal: Ability to manage health-related needs will improve Outcome: Progressing   Problem: Clinical Measurements: Goal: Ability to maintain clinical measurements within normal limits will improve Outcome: Progressing Goal: Will remain free from infection Outcome: Progressing Goal: Diagnostic test results will improve Outcome: Progressing Goal: Respiratory complications will improve Outcome: Progressing Goal: Cardiovascular complication will be avoided Outcome: Progressing   Problem: Nutrition: Goal: Adequate nutrition will be maintained Outcome: Progressing   Problem: Coping: Goal: Level of anxiety will decrease Outcome: Progressing   Problem: Pain Managment: Goal: General experience of comfort will improve and/or be controlled Outcome: Progressing   Problem: Safety: Goal: Ability to remain free from injury will improve Outcome: Progressing   Problem: Health Behavior/Discharge Planning: Goal: Compliance with prescribed medication regimen will improve Outcome: Progressing

## 2023-09-18 NOTE — Progress Notes (Signed)
 [facility] Progress Note  Patient: Eddie Berry    ZOX:[id number]  DOA: 09/17/2023     Date of Service: the patient was seen and examined on 09/18/2023  Chief Complaint  Patient presents with   Seizures   Brief hospital course: Eddie Berry is a 38 y.o. male with medical history significant of TBI, seizure disorder, schizophrenia, cigar smoking, asthma, presented with seizure.   Patient is postictal and does not remember much of the event, all history obtained by reviewing of ED record and discussion with ED physician.  Patient went to have a routine neurology follow-up this morning, and neurology had the plan to cut down some of his seizure medications due to side effect of daytime lethargy.  During the visit however patient became unresponsive with eyes wide open and staring forward for about 10-15 minutes.  And patient sent to ED for the evaluation.  During ED stay, patient had another similar episode when he suddenly stopped responding and staring at all for 10 minutes.  After that, patient appears to be back to his baseline and ED discharge him back to group home.  During the ride however patient had a third episode of staring and not responding and was driven back to ED.  According to patient's caregiver, he has been taking his seizure medications which were provided through [employer].   Assessment and Plan:  AMS, resolved  Likely absence seizure -probably breakthrough seizure in nature -As per neurologist recommendation, continue Depakote  and Vimpat  on current dose - No reported for or trauma, hold off further brain image study Neurology following Keppra  level and lacosamide  levels are pending, depending on the blood level, dose will be uptitrated. Patient may need LTM EEG which can be done at [facility], patient may need to be transferred there.  Awaiting for final neurology recommendation    History of schizophrenia -No acute concern - Continue Invega     Mild intermittent asthma -No acute concern -Continue albuterol  as needed  Hypothyroid: Synthroid   Body mass index is 27.12 kg/m.  Interventions:  Diet: Regular diet DVT Prophylaxis: Subcutaneous Lovenox    Advance goals of care discussion: Full code  Family Communication: family was not present at bedside, at the time of interview.  The pt provided permission to discuss medical plan with the family. Opportunity was given to ask question and all questions were answered satisfactorily.   Disposition:  Pt is from home, admitted with seizure disorder, still has risk of seizure, which precludes a safe discharge. Discharge to home, when stable and cleared by neurology.  Subjective: No significant events overnight, patient remained seizure-free, denies any headache or dizziness.  No new other complaints.  Physical Exam: General: NAD, lying comfortably Appear in no distress, affect appropriate Eyes: PERRLA ENT: Oral Mucosa Clear, moist  Neck: no JVD,  Cardiovascular: S1 and S2 Present, no Murmur,  Respiratory: good respiratory effort, Bilateral Air entry equal and Decreased, no Crackles, no wheezes Abdomen: Bowel Sound present, Soft and no tenderness,  Skin: no rashes Extremities: no Pedal edema, no calf tenderness Neurologic: Right facial palsy, no any other focal deficits Gait not checked due to patient safety concerns  Vitals:   09/17/23 1730 09/17/23 2106 09/18/23 0406 09/18/23 0827  BP: (!) 128/104 138/88 114/75 122/87  Pulse: 94 (!) 107 98 99  Resp: 19   18  Temp:  98 F (36.7 C) 97.9 F (36.6 C) 98.1 F (36.7 C)  TempSrc:   Oral Oral  SpO2: 97% 96% 94% 96%  Weight:      Height:        Intake/Output Summary (Last 24 hours) at 09/18/2023 1241 Last data filed at 09/18/2023 0900 Gross per 24 hour  Intake 954 ml  Output 2125 ml  Net -1171 ml   Filed Weights   09/17/23 1636  Weight: 90.7 kg    Data Reviewed: I have personally reviewed and interpreted daily  labs, tele strips, imagings as discussed above. I reviewed all nursing notes, pharmacy notes, vitals, pertinent old records I have discussed plan of care as described above with RN and patient/family.  CBC: Recent Labs  Lab 09/17/23 1415  WBC 5.8  NEUTROABS 2.9  HGB 13.9  HCT 41.1  MCV 93.6  PLT 307   Basic Metabolic Panel: Recent Labs  Lab 09/17/23 1415 09/18/23 0833  NA 141  --   K 3.7  --   CL 106  --   CO2 26  --   GLUCOSE 113*  --   BUN 13  --   CREATININE 0.86  --   CALCIUM 9.2  --   MG  --  1.7    Studies: No results found.  Scheduled Meds:  divalproex   500 mg Oral BID   docusate sodium   100 mg Oral Daily   enoxaparin  (LOVENOX ) injection  40 mg Subcutaneous Q24H   fenofibrate   54 mg Oral Daily   lacosamide   200 mg Oral BID   levothyroxine   25 mcg Oral Q0600   loratadine   10 mg Oral Daily   mirabegron  ER  50 mg Oral Daily   mirtazapine  15 mg Oral QHS   paliperidone   9 mg Oral Daily   pantoprazole   40 mg Oral Daily   propranolol   10 mg Oral Daily   sertraline   100 mg Oral Daily   sodium chloride  flush  3-10 mL Intravenous Q12H   Continuous Infusions: PRN Meds: acetaminophen , albuterol , azelastine, fluticasone , LORazepam , melatonin, ondansetron  **OR** ondansetron  (ZOFRAN ) IV, mouth rinse, sodium chloride  flush  Time spent: 35 minutes  Author: Althia Atlas. MD [facility] 09/18/2023 12:41 PM  To reach On-call, see care teams to locate the attending and reach out to them via [email or web address]. If 7PM-7AM, please contact night-coverage If you still have difficulty reaching the attending provider, please page the [facility] (Director on Call) for [facility] on amion for assistance.

## 2023-09-18 NOTE — Plan of Care (Signed)

## 2023-09-18 NOTE — Progress Notes (Signed)
 Patient sent for EEG via bed in stable condition.

## 2023-09-18 NOTE — Procedures (Signed)
 Patient Name: Eddie Berry  MRN: [id number]  Epilepsy Attending: Arleene Lack  Referring Physician/Provider: Kimberley Penman, MD  Date: 09/18/2023 Duration: 26.03 mins  Patient history: 38yo M with breakthrough seizure. EEG to evaluate for seizure  Level of alertness: Awake, drowsy  AEDs during EEG study: VPA, ZNS, LCM  Technical aspects: This EEG study was done with scalp electrodes positioned according to the 10-20 International system of electrode placement. Electrical activity was reviewed with band pass filter of 1-70Hz , sensitivity of 7 uV/mm, display speed of 46mm/sec with a 60Hz  notched filter applied as appropriate. EEG data were recorded continuously and digitally stored.  Video monitoring was available and reviewed as appropriate.  Description: The posterior dominant rhythm consists of 9 Hz activity of moderate voltage (25-35 uV) seen predominantly in posterior head regions, symmetric and reactive to eye opening and eye closing. Drowsiness was characterized by attenuation of the posterior background rhythm. Hyperventilation and photic stimulation were not performed.     IMPRESSION: This study is within normal limits. No seizures or epileptiform discharges were seen throughout the recording.  A normal interictal EEG does not exclude the diagnosis of epilepsy.   Dontrey Snellgrove O Juwann Sherk

## 2023-09-18 NOTE — Plan of Care (Addendum)
 EEG is pending.   Given his worsened seizure frequency, his maximum dosing of Vimpat  and inability to tolerate higher doses of VPA, I am starting a trial of zonisamide, 50 mg po at bedtime. He will now be on 3 AEDs: vallproic acid, Vimpat  and zonisamide.   Electronically signed: Dr. Carlo Guevarra

## 2023-09-18 NOTE — Progress Notes (Signed)
 Patient received from procedure via bed in stable condition.

## 2023-09-18 NOTE — Progress Notes (Signed)
 Eeg done

## 2023-09-19 ENCOUNTER — Telehealth: Payer: Self-pay | Admitting: Family Medicine

## 2023-09-19 ENCOUNTER — Other Ambulatory Visit: Payer: Self-pay

## 2023-09-19 ENCOUNTER — Emergency Department

## 2023-09-19 ENCOUNTER — Emergency Department
Admission: EM | Admit: 2023-09-19 | Discharge: 2023-09-19 | Disposition: A | Discharge status: Home / Self Care | Attending: Emergency Medicine | Admitting: Emergency Medicine

## 2023-09-19 ENCOUNTER — Encounter: Payer: Self-pay | Admitting: Intensive Care

## 2023-09-19 DIAGNOSIS — R0989 Other specified symptoms and signs involving the circulatory and respiratory systems: Secondary | ICD-10-CM | POA: Diagnosis not present

## 2023-09-19 DIAGNOSIS — R569 Unspecified convulsions: Secondary | ICD-10-CM | POA: Diagnosis not present

## 2023-09-19 DIAGNOSIS — R000 Tachycardia, unspecified: Secondary | ICD-10-CM | POA: Diagnosis not present

## 2023-09-19 DIAGNOSIS — F840 Autistic disorder: Secondary | ICD-10-CM | POA: Insufficient documentation

## 2023-09-19 DIAGNOSIS — R9389 Abnormal findings on diagnostic imaging of other specified body structures: Secondary | ICD-10-CM | POA: Diagnosis not present

## 2023-09-19 LAB — BASIC METABOLIC PANEL WITH GFR
Anion gap: 12 (ref 5–15)
Anion gap: 11 (ref 5–15)
BUN: 12 mg/dL (ref 6–20)
BUN: 11 mg/dL (ref 6–20)
CO2: 26 mmol/L (ref 22–32)
CO2: 24 mmol/L (ref 22–32)
Calcium: 9.3 mg/dL (ref 8.9–10.3)
Calcium: 9.1 mg/dL (ref 8.9–10.3)
Chloride: 102 mmol/L (ref 98–111)
Chloride: 101 mmol/L (ref 98–111)
Creatinine, Ser: 0.91 mg/dL (ref 0.61–1.24)
Creatinine, Ser: 0.81 mg/dL (ref 0.61–1.24)
GFR, Estimated: >60 mL/min (ref >60)
GFR, Estimated: >60 mL/min (ref >60)
Glucose, Bld: 95 mg/dL (ref 70–99)
Glucose, Bld: 82 mg/dL (ref 70–99)
Potassium: 3.8 mmol/L (ref 3.5–5.1)
Potassium: 3.5 mmol/L (ref 3.5–5.1)
Sodium: 140 mmol/L (ref 135–145)
Sodium: 136 mmol/L (ref 135–145)

## 2023-09-19 LAB — CBC
HCT: 42.7 % (ref 39.0–52.0)
HCT: 39.5 % (ref 39.0–52.0)
Hemoglobin: 14 g/dL (ref 13.0–17.0)
Hemoglobin: 13.3 g/dL (ref 13.0–17.0)
MCH: 30.8 pg (ref 26.0–34.0)
MCH: 31.5 pg (ref 26.0–34.0)
MCHC: 32.8 g/dL (ref 30.0–36.0)
MCHC: 33.7 g/dL (ref 30.0–36.0)
MCV: 93.8 fL (ref 80.0–100.0)
MCV: 93.6 fL (ref 80.0–100.0)
Platelets: 318 10*3/uL (ref 150–400)
Platelets: 322 10*3/uL (ref 150–400)
RBC: 4.55 MIL/uL (ref 4.22–5.81)
RBC: 4.22 MIL/uL (ref 4.22–5.81)
RDW: 12.3 % (ref 11.5–15.5)
RDW: 12.2 % (ref 11.5–15.5)
WBC: 11.7 10*3/uL — ABNORMAL HIGH (ref 4.0–10.5)
WBC: 12.8 10*3/uL — ABNORMAL HIGH (ref 4.0–10.5)
nRBC: 0 % (ref 0.0–0.2)
nRBC: 0 % (ref 0.0–0.2)

## 2023-09-19 LAB — LEVETIRACETAM LEVEL: Levetiracetam Lvl: <2 ug/mL — ABNORMAL LOW (ref 10.0–40.0)

## 2023-09-19 LAB — TROPONIN I (HIGH SENSITIVITY): Troponin I (High Sensitivity): 3 ng/L (ref <18)

## 2023-09-19 LAB — MAGNESIUM: Magnesium: 1.7 mg/dL (ref 1.7–2.4)

## 2023-09-19 LAB — VALPROIC ACID LEVEL: Valproic Acid Lvl: 50 ug/mL (ref 50–100)

## 2023-09-19 LAB — PHOSPHORUS: Phosphorus: 3.1 mg/dL (ref 2.5–4.6)

## 2023-09-19 MED ORDER — ZONISAMIDE 50 MG PO CAPS: 50.0000 mg | ORAL_CAPSULE | Freq: Every day | ORAL | 2 refills | Status: DC

## 2023-09-19 MED ORDER — ACETAMINOPHEN 500 MG PO TABS: 500.0000 mg | ORAL_TABLET | Freq: Three times a day (TID) | ORAL | Status: AC | PRN

## 2023-09-19 MED ORDER — CYANOCOBALAMIN 1000 MCG PO TABS
1000.0000 ug | ORAL_TABLET | Freq: Every day | ORAL | 0 refills | Status: DC
Start: 2023-09-20 — End: 2023-09-24

## 2023-09-19 MED ORDER — CYANOCOBALAMIN 1000 MCG/ML IJ SOLN
1000.0000 ug | Freq: Once | INTRAMUSCULAR | Status: AC
  Administered 2023-09-19: 1000 ug via INTRAMUSCULAR
  Filled 2023-09-19: qty 1

## 2023-09-19 MED ORDER — VITAMIN B-12 1000 MCG PO TABS: 1000.0000 ug | ORAL_TABLET | Freq: Every day | ORAL | Status: DC

## 2023-09-19 MED ORDER — DIVALPROEX SODIUM 500 MG PO DR TAB: 500.0000 mg | DELAYED_RELEASE_TABLET | Freq: Two times a day (BID) | ORAL | 2 refills | Status: AC

## 2023-09-19 MED ORDER — FLUTICASONE PROPIONATE 50 MCG/ACT NA SUSP
2.0000 | Freq: Every day | NASAL | Status: DC | PRN
  Filled 2023-09-19: qty 16

## 2023-09-19 NOTE — [place] FL2 (Signed)
 [place]  MEDICAID FL2 LEVEL OF CARE FORM     IDENTIFICATION  Patient Name: Eddie Berry Birthdate: 11-Jul-1985 Sex: male Admission Date (Current Location): 09/17/2023  [facility] and [place] Number:  Chiropodist and Address:  [facility], [address] [place], [place] [address]      Provider Number: [id number]  Attending Physician Name and Address:  Althia Atlas, MD  Relative Name and Phone Number:  [name], legal guardian, phone: [phone]    Current Level of Care: Hospital Recommended Level of Care: [facility] ([employer], [address] [place], [place] [address]) Prior Approval Number:    Date Approved/Denied:   PASRR Number:    Discharge Plan: Other (Comment) (Family Care Home)    Current Diagnoses: Patient Active Problem List   Diagnosis Date Noted   Seizure disorder (HCC) 09/18/2023   Observed seizure-like activity (HCC) 08/22/2023   Obesity (BMI 30.0-34.9) 08/04/2023   Chronic constipation 08/04/2023   Vitamin D  deficiency 08/04/2023   Chronic dryness of both eyes 08/04/2023   Gastroesophageal reflux disease 08/04/2023   Tobacco use 05/15/2022   Hypoglycemia 05/15/2022   Medication management 02/06/2022   Lethargy 02/05/2022   Acute respiratory failure with hypoxia (HCC) 02/04/2022   CAP (community acquired pneumonia) 02/03/2022   Hypothyroidism 02/03/2022   Acute pulmonary embolism (HCC) 02/03/2022   Excessive daytime sleepiness 12/21/2021   Seizure (HCC) 06/16/2020   Mood disorder (HCC) 06/16/2020   TBI (traumatic brain injury) (HCC)     Orientation RESPIRATION BLADDER Height & Weight        Normal Incontinent Weight: 90.7 kg Height:  6' (182.9 cm)  BEHAVIORAL SYMPTOMS/MOOD NEUROLOGICAL BOWEL NUTRITION STATUS    Convulsions/Seizures Incontinent Diet (Please see discharge summary)  AMBULATORY STATUS COMMUNICATION OF NEEDS Skin   Supervision Verbally Normal                        Personal Care Assistance Level of Assistance  Bathing, Feeding, Dressing, Total care Bathing Assistance: Independent Feeding assistance: Independent Dressing Assistance: Independent Total Care Assistance: Independent   Functional Limitations Info             SPECIAL CARE FACTORS FREQUENCY                       Contractures      Additional Factors Info  Code Status Code Status Info: Full Code             Current Medications (09/19/2023):  This is the current hospital active medication list Current Facility-Administered Medications  Medication Dose Route Frequency Provider Last Rate Last Admin   acetaminophen  (TYLENOL ) tablet 500 mg  500 mg Oral Q6H PRN Antoniette Batty T, MD       albuterol  (PROVENTIL ) (2.5 MG/3ML) 0.083% nebulizer solution 2.5 mg  2.5 mg Inhalation Q6H PRN Antoniette Batty T, MD       azelastine (ASTELIN) 0.1 % nasal spray 2 spray  2 spray Each Nare PRN [place], MD       cloZAPine  (CLOZARIL ) tablet 300 mg  300 mg Oral Daily Althia Atlas, MD   300 mg at 09/19/23 1017   [START ON 09/20/2023] cyanocobalamin  (VITAMIN B12) tablet 1,000 mcg  1,000 mcg Oral Daily Althia Atlas, MD       divalproex  (DEPAKOTE ) DR tablet 500 mg  500 mg Oral BID Belue, Nathan S, RPH   500 mg at 09/19/23 1016  docusate sodium  (COLACE) capsule 100 mg  100 mg Oral Daily Antoniette Batty T, MD   100 mg at 09/19/23 1015   enoxaparin  (LOVENOX ) injection 40 mg  40 mg Subcutaneous Q24H Antoniette Batty T, MD   40 mg at 09/18/23 2123   fenofibrate  tablet 54 mg  54 mg Oral Daily Antoniette Batty T, MD   54 mg at 09/19/23 1016   fluticasone  (FLONASE ) 50 MCG/ACT nasal spray 2 spray  2 spray Each Nare Daily PRN Althia Atlas, MD       lacosamide  (VIMPAT ) tablet 200 mg  200 mg Oral BID Antoniette Batty T, MD   200 mg at 09/19/23 1015   levothyroxine  (SYNTHROID ) tablet 25 mcg  25 mcg Oral Q0600 Antoniette Batty T, MD   25 mcg at 09/19/23 4098   loratadine  (CLARITIN ) tablet 10 mg  10 mg Oral Daily Antoniette Batty T, MD    10 mg at 09/18/23 2122   LORazepam  (ATIVAN ) injection 4 mg  4 mg Intravenous Q5 Min x 2 PRN Antoniette Batty T, MD       melatonin tablet 5 mg  5 mg Oral QHS PRN Antoniette Batty T, MD   5 mg at 09/18/23 2122   mirabegron  ER (MYRBETRIQ ) tablet 50 mg  50 mg Oral Daily Zhang, Ping T, MD   50 mg at 09/19/23 1017   mirtazapine (REMERON) tablet 15 mg  15 mg Oral QHS Zhang, Ping T, MD   15 mg at 09/18/23 2122   ondansetron  (ZOFRAN ) tablet 4 mg  4 mg Oral Q6H PRN [place], MD       Or   ondansetron  (ZOFRAN ) injection 4 mg  4 mg Intravenous Q6H PRN [place], MD       Oral care mouth rinse  15 mL Mouth Rinse PRN Antoniette Batty T, MD       paliperidone  (INVEGA ) 24 hr tablet 12 mg  12 mg Oral Daily Althia Atlas, MD   12 mg at 09/19/23 1017   pantoprazole  (PROTONIX ) EC tablet 40 mg  40 mg Oral Daily Zhang, Ping T, MD   40 mg at 09/19/23 1016   propranolol  (INDERAL ) tablet 10 mg  10 mg Oral Daily Antoniette Batty T, MD   10 mg at 09/19/23 1016   sertraline  (ZOLOFT ) tablet 150 mg  150 mg Oral QHS Althia Atlas, MD       sodium chloride  flush (NS) 0.9 % injection 3-10 mL  3-10 mL Intravenous Q12H Antoniette Batty T, MD   10 mL at 09/19/23 1018   sodium chloride  flush (NS) 0.9 % injection 3-10 mL  3-10 mL Intravenous PRN Antoniette Batty T, MD       zonisamide (ZONEGRAN) capsule 50 mg  50 mg Oral Daily Lindzen, Eric, MD   50 mg at 09/19/23 1017     Discharge Medications: Please see discharge summary for a list of discharge medications.  Relevant Imaging Results:  Relevant Lab Results:   Additional Information SSN: [id number]  Crayton Docker, RN

## 2023-09-19 NOTE — Telephone Encounter (Unsigned)
 Copied from CRM [id number]. Topic: Appointments - Appointment Scheduling >> Sep 19, 2023 10:59 AM Chasity T wrote: Stephenie Einstein from hospital is calling to schedule an appointment for patient. She is wanting something for next Friday May 23rd for follow up appointment. Please contact patient for appointment details. [phone].

## 2023-09-19 NOTE — Plan of Care (Signed)

## 2023-09-19 NOTE — Discharge Summary (Signed)
 [facility] Discharge Summary   Patient: Eddie Berry WUJ:[id number]  PCP: Carlean Charter, DO  Date of admission: 09/17/2023   Date of discharge:  09/19/2023     Discharge Diagnoses:  Principal Problem:   Seizure [facility]) Active Problems:   Seizure disorder (HCC)   Admitted From: Group home Disposition: Group home  Recommendations for Outpatient Follow-up:  F/u with PCP in 1 wk F/u with Neurology in 1 wk Follow up LABS/TEST:     Follow-up Information     Carlean Charter, DO. Go on 09/26/2023.   Specialty: Family Medicine Why: Office will call patient to set up follow up appt Contact information: [address] [place] [address] [phone]                Diet recommendation: Regular diet  Activity: The patient is advised to gradually reintroduce usual activities, as tolerated  Discharge Condition: stable  Code Status: Full code   History of present illness: As per the H and P dictated on admission Hospital Course:  Eddie Berry is a 38 y.o. male with medical history significant of TBI, seizure disorder, schizophrenia, cigar smoking, asthma, presented with seizure. Patient is postictal and does not remember much of the event, all history obtained by reviewing of ED record and discussion with ED physician.  Patient went to have a routine neurology follow-up this morning, and neurology had the plan to cut down some of his seizure medications due to side effect of daytime lethargy.  During the visit however patient became unresponsive with eyes wide open and staring forward for about 10-15 minutes.  And patient sent to ED for the evaluation.  During ED stay, patient had another similar episode when he suddenly stopped responding and staring at all for 10 minutes.  After that, patient appears to be back to his baseline and ED discharge him back to group home.  During the ride however patient had a third episode of staring and not responding  and was driven back to ED.  According to patient's caregiver, he has been taking his seizure medications which were provided through bubble packaging.     Assessment and Plan:  # AMS, resolved. Likely absence seizure probably breakthrough seizure in nature Neurology consulted, continued Vimpat  200 mg p.o. daily, Increased Depakote  to 500 mg p.o. twice daily. EEG was done which was negative.  started on zonisamide 50 mg p.o. daily.  Patient was cleared by neurology to discharge and follow-up as an outpatient in 1 week.    # History of schizophrenia and depression: No acute concern Continue Invega , Clozapine  300 mg p.o. daily, Zoloft .  Continued hydroxyzine as needed for anxiety home dose. Recommend to follow with psych as per schedule.   # Mild intermittent asthma: No acute concern. Continue albuterol  as needed # Hypothyroid: Synthroid    Body mass index is 27.12 kg/m.  Nutrition Interventions:  - Patient was instructed, not to drive, operate heavy machinery, perform activities at heights, swimming or participation in water activities or provide baby sitting services while on Pain, Sleep and Anxiety Medications; until his outpatient Physician has advised to do so again.  - Also recommended to not to take more than prescribed Pain, Sleep and Anxiety Medications.  Patient was ambulatory without any assistance. On the day of the discharge the patient's vitals were stable, and no other acute medical condition were reported by patient. the patient was felt safe to be discharge at back to group home.  Consultants: Neurology  Procedures: EEG  Discharge Exam: General: Appear in no distress, no Rash; Oral Mucosa Clear, moist. Cardiovascular: S1 and S2 Present, no Murmur, Respiratory: normal respiratory effort, Bilateral Air entry present and no Crackles, no wheezes Abdomen: Bowel Sound present, Soft and no tenderness, no hernia Extremities: no Pedal edema, no calf tenderness Neurology:  chronic right facial palsy, no any other focal deficits affect appropriate.  Filed Weights   09/17/23 1636  Weight: 90.7 kg   Vitals:   09/19/23 1153 09/19/23 1212  BP: 126/84 122/80  Pulse: (!) 116 98  Resp: 18 20  Temp: 98.1 F (36.7 C) 98.4 F (36.9 C)  SpO2: 95% 98%    DISCHARGE MEDICATION: Allergies as of 09/19/2023       Reactions   Antihistamines, Diphenhydramine-type Other (See Comments)   UNK reaction   Nsaids Other (See Comments)   Unk reaction   Prednisone Other (See Comments)   UNK reaction   Aspirin Rash   Penicillins Rash        Medication List     STOP taking these medications    mirtazapine 15 MG tablet Commonly known as: REMERON       TAKE these medications    acetaminophen  500 MG tablet Commonly known as: TYLENOL  Take 1 tablet (500 mg total) by mouth every 8 (eight) hours as needed for mild pain (pain score 1-3), moderate pain (pain score 4-6), fever or headache. What changed: when to take this   albuterol  108 (90 Base) MCG/ACT inhaler Commonly known as: VENTOLIN  HFA Inhale 2 puffs into the lungs every 6 (six) hours as needed for wheezing or shortness of breath.   cetirizine 10 MG tablet Commonly known as: ZYRTEC Take 10 mg by mouth daily.   cloZAPine  100 MG tablet Commonly known as: CLOZARIL  Take 100 mg by mouth daily. Take along with one 200 mg tablet for total 300 mg once daily   Clozapine  200 MG Tbdp Take 200 mg by mouth daily. Take along with one 100 mg tablet for total 300 mg once daily   cyanocobalamin  1000 MCG tablet Take 1 tablet (1,000 mcg total) by mouth daily. Start taking on: Sep 20, 2023   divalproex  500 MG DR tablet Commonly known as: DEPAKOTE  Take 1 tablet (500 mg total) by mouth 2 (two) times daily. What changed:  when to take this Another medication with the same name was removed. Continue taking this medication, and follow the directions you see here.   docusate sodium  100 MG capsule Commonly known as:  Colace Take 1 capsule (100 mg total) by mouth daily.   fenofibrate  48 MG tablet Commonly known as: TRICOR  Take 48 mg by mouth daily.   fluticasone  50 MCG/ACT nasal spray Commonly known as: FLONASE  Place 2 sprays into both nostrils daily.   hydroxypropyl methylcellulose / hypromellose 2.5 % ophthalmic solution Commonly known as: ISOPTO TEARS / GONIOVISC Place 2 drops into both eyes 2 (two) times daily.   hydrOXYzine 50 MG tablet Commonly known as: ATARAX Take 25-50 mg by mouth 2 (two) times daily as needed for anxiety.   ibuprofen 400 MG tablet Commonly known as: ADVIL Take 1.5 tablets (600 mg total) by mouth every 8 (eight) hours as needed. What changed:  medication strength when to take this reasons to take this   lacosamide  200 MG Tabs tablet Commonly known as: VIMPAT  Take 1 tablet (200 mg total) by mouth 2 (two) times daily.   levothyroxine  25 MCG tablet Commonly known as: SYNTHROID  Take 25 mcg by  mouth daily before breakfast.   Melatonin 10 MG Tabs Take 10 mg by mouth at bedtime.   Myrbetriq  50 MG Tb24 tablet Generic drug: mirabegron  ER Take 50 mg by mouth daily.   omeprazole 20 MG capsule Commonly known as: PRILOSEC Take 20 mg by mouth daily.   paliperidone  6 MG 24 hr tablet Commonly known as: INVEGA  Take 12 mg by mouth every morning.   propranolol  10 MG tablet Commonly known as: INDERAL  Take 10 mg by mouth daily.   sertraline  100 MG tablet Commonly known as: ZOLOFT  Take 150 mg by mouth at bedtime.   THEREMS PO Take 1 tablet by mouth daily.   Vitamin D3 50 MCG (2000 UT) Tabs Take 1 tablet by mouth daily.   zonisamide 50 MG capsule Commonly known as: ZONEGRAN Take 1 capsule (50 mg total) by mouth daily. Start taking on: Sep 20, 2023       Allergies  Allergen Reactions   Antihistamines, Diphenhydramine-Type Other (See Comments)    UNK reaction   Nsaids Other (See Comments)    Unk reaction   Prednisone Other (See Comments)    UNK  reaction   Aspirin Rash   Penicillins Rash   Discharge Instructions     Call MD for:   Complete by: As directed    Seizures   Call MD for:  difficulty breathing, headache or visual disturbances   Complete by: As directed    Call MD for:  extreme fatigue   Complete by: As directed    Call MD for:  persistant dizziness or light-headedness   Complete by: As directed    Call MD for:  persistant nausea and vomiting   Complete by: As directed    Call MD for:  severe uncontrolled pain   Complete by: As directed    Call MD for:  temperature >100.4   Complete by: As directed    Diet - low sodium heart healthy   Complete by: As directed    Discharge instructions   Complete by: As directed    F/u with PCP in 1 wk F/u with Neurology in 1 wk   Increase activity slowly   Complete by: As directed        The results of significant diagnostics from this hospitalization (including imaging, microbiology, ancillary and laboratory) are listed below for reference.    Significant Diagnostic Studies: EEG adult Result Date: 09/18/2023 Arleene Lack, MD     09/18/2023  3:36 PM Patient Name: Schylar Friday MRN: [id number] Epilepsy Attending: Arleene Lack Referring Physician/Provider: Kimberley Penman, MD Date: 09/18/2023 Duration: 26.03 mins Patient history: 38yo M with breakthrough seizure. EEG to evaluate for seizure Level of alertness: Awake, drowsy AEDs during EEG study: VPA, ZNS, LCM Technical aspects: This EEG study was done with scalp electrodes positioned according to the 10-20 International system of electrode placement. Electrical activity was reviewed with band pass filter of 1-70Hz , sensitivity of 7 uV/mm, display speed of 19mm/sec with a 60Hz  notched filter applied as appropriate. EEG data were recorded continuously and digitally stored.  Video monitoring was available and reviewed as appropriate. Description: The posterior dominant rhythm consists of 9 Hz activity of moderate voltage  (25-35 uV) seen predominantly in posterior head regions, symmetric and reactive to eye opening and eye closing. Drowsiness was characterized by attenuation of the posterior background rhythm. Hyperventilation and photic stimulation were not performed.   IMPRESSION: This study is within normal limits. No seizures or epileptiform discharges were seen throughout the  recording. A normal interictal EEG does not exclude the diagnosis of epilepsy. Arleene Lack   CT Head Wo Contrast Result Date: 08/28/2023 CLINICAL DATA:  Seizure with altered mental status. EXAM: CT HEAD WITHOUT CONTRAST TECHNIQUE: Contiguous axial images were obtained from the base of the skull through the vertex without intravenous contrast. RADIATION DOSE REDUCTION: This exam was performed according to the departmental dose-optimization program which includes automated exposure control, adjustment of the mA and/or kV according to patient size and/or use of iterative reconstruction technique. COMPARISON:  Head CT 08/22/2023. FINDINGS: Brain: No evidence of acute infarction, hemorrhage, hydrocephalus, extra-axial collection or mass lesion/mass effect. Encephalomalacia in the bilateral frontal lobes, left temporal lobe and right cerebellum appear unchanged. Vascular: No hyperdense vessel or unexpected calcification. Skull: There are old burr holes bilaterally and old left craniotomy. No acute fracture. Sinuses/Orbits: No acute finding. Other: None. IMPRESSION: 1. No acute intracranial process. 2. Stable encephalomalacia in the bilateral frontal lobes, left temporal lobe and right cerebellum. Electronically Signed   By: Tyron Gallon M.D.   On: 08/28/2023 19:24   EEG adult Result Date: 08/22/2023 Arleene Lack, MD     08/22/2023  3:02 PM Patient Name: Augusten Tesfay MRN: [id number] Epilepsy Attending: Arleene Lack Referring Physician/Provider: [place], MD Date: 08/22/2023 Duration: 26.14 mins Patient history: 38yo M with ams. EEG to  evaluate for seizure Level of alertness: Awake AEDs during EEG study: LEV, VPA, LCM, Ativan  Technical aspects: This EEG study was done with scalp electrodes positioned according to the 10-20 International system of electrode placement. Electrical activity was reviewed with band pass filter of 1-70Hz , sensitivity of 7 uV/mm, display speed of 46mm/sec with a 60Hz  notched filter applied as appropriate. EEG data were recorded continuously and digitally stored.  Video monitoring was available and reviewed as appropriate. Description: The posterior dominant rhythm consists of 8-9 Hz activity of moderate voltage (25-35 uV) seen predominantly in posterior head regions, symmetric and reactive to eye opening and eye closing. Drowsiness was characterized by attenuation of posterior dominant rhythm. There is an excessive amount of 15 to 18 Hz beta activity distributed symmetrically and diffusely. Hyperventilation and photic stimulation were not performed.   ABNORMALITY - Excessive beta, generalized IMPRESSION: This study is within normal limits. The excessive beta activity seen in the background is most likely due to the effect of benzodiazepine and is a benign EEG pattern. No seizures or epileptiform discharges were seen throughout the recording. A normal interictal EEG does not exclude the diagnosis of epilepsy. Arleene Lack   DG Chest 1 View Result Date: 08/22/2023 CLINICAL DATA:  Seizure with coughing congestion. EXAM: CHEST  1 VIEW COMPARISON:  07/31/2023 FINDINGS: Stable asymmetric elevation right hemidiaphragm. The lungs are clear without focal pneumonia, edema, pneumothorax or pleural effusion. Cardiopericardial silhouette is at upper limits of normal for size. No acute bony abnormality. Telemetry leads overlie the chest. IMPRESSION: No active disease. Electronically Signed   By: Donnal Fusi M.D.   On: 08/22/2023 07:57   CT Head Wo Contrast Result Date: 08/22/2023 CLINICAL DATA:  Altered mental status. EXAM:  CT HEAD WITHOUT CONTRAST TECHNIQUE: Contiguous axial images were obtained from the base of the skull through the vertex without intravenous contrast. RADIATION DOSE REDUCTION: This exam was performed according to the departmental dose-optimization program which includes automated exposure control, adjustment of the mA and/or kV according to patient size and/or use of iterative reconstruction technique. COMPARISON:  Head CT 10/11/2022 FINDINGS: Brain: Areas of focal encephalomalacia from chronic  infarcts are again noted in right superior and inferior frontal and left inferior/lateral frontal lobes, broad-based chronic infarct in the left temporal lobe and smaller chronic infarct in the posterior right cerebellar hemisphere. No new cortical based infarct, hemorrhage, mass or mass effect are seen. There is no midline shift. There is background mild atrophy and small-vessel disease. Mild atrophic ventriculomegaly. Basal cisterns are patent. Vascular: No hyperdense vessel or unexpected calcification. Skull: Negative for fractures or focal lesions. Right frontal and left frontotemporal craniotomy changes are chronically seen. Sinuses/Orbits: No acute finding. Other: None. IMPRESSION: 1. No acute intracranial CT findings or interval changes. 2. Chronic changes including multiple infarcts, and postsurgical changes. Electronically Signed   By: Denman Fischer M.D.   On: 08/22/2023 04:47    Microbiology: No results found for this or any previous visit (from the past 240 hours).   Labs: CBC: Recent Labs  Lab 09/17/23 1415 09/19/23 0444  WBC 5.8 11.7*  NEUTROABS 2.9  --   HGB 13.9 14.0  HCT 41.1 42.7  MCV 93.6 93.8  PLT 307 318   Basic Metabolic Panel: Recent Labs  Lab 09/17/23 1415 09/18/23 0833 09/19/23 0444  NA 141  --  140  K 3.7  --  3.8  CL 106  --  102  CO2 26  --  26  GLUCOSE 113*  --  95  BUN 13  --  12  CREATININE 0.86  --  0.91  CALCIUM 9.2  --  9.3  MG  --  1.7 1.7  PHOS  --   --   3.1   Liver Function Tests: Recent Labs  Lab 09/17/23 1415  AST 25  ALT 22  ALKPHOS 54  BILITOT 0.5  PROT 7.5  ALBUMIN 4.0   No results for input(s): "LIPASE", "AMYLASE" in the last 168 hours. No results for input(s): "AMMONIA" in the last 168 hours. Cardiac Enzymes: No results for input(s): "CKTOTAL", "CKMB", "CKMBINDEX", "TROPONINI" in the last 168 hours. BNP (last 3 results) No results for input(s): "BNP" in the last 8760 hours. CBG: Recent Labs  Lab 09/17/23 1650  GLUCAP 78    Time spent: 35 minutes  Signed:  Althia Atlas  [facility] 09/19/2023 12:52 PM

## 2023-09-19 NOTE — TOC Transition Note (Addendum)
 Transition of Care [facility]) - Discharge Note   Patient Details  Name: Sullivan Karwowski MRN: [id number] Date of Birth: Jan 17, 1986  [facility]) CM/SW Contact:  Crayton Docker, RN 09/19/2023, 1:13 PM   Clinical Narrative:     Discharge orders noted for home/self care. Transportation arranged per [facility].  Returning FL2 placed in discharge packet. Final next level of care:  ([facility]) Barriers to Discharge: No Barriers Identified   Patient Goals and CMS Choice    Family Care Home  Discharge Placement      Family Care Home          Discharge Plan and Services Additional resources added to the After Visit Summary for       Social Drivers of Health (SDOH) Interventions SDOH Screenings   Food Insecurity: No Food Insecurity (09/18/2023)  Housing: Low Risk  (09/18/2023)  Transportation Needs: No Transportation Needs (09/18/2023)  Utilities: Not At Risk (09/18/2023)  Depression (PHQ2-9): Low Risk  (08/04/2023)  Tobacco Use: Medium Risk (09/17/2023)     Readmission Risk Interventions     No data to display

## 2023-09-19 NOTE — ED Provider Notes (Signed)
 [facility] Provider Note   Event Date/Time   First MD Initiated Contact with Patient 09/19/23 1806     (approximate) History  No chief complaint on file.  HPI Eddie Berry is a 38 y.o. male with past medical history of autism, seizure disorder, TBI who presents to the emergency department after discharge this morning for possible seizure activity.  Patient is here patient arrives after having a staring spell and became unresponsive for approximately 10 minutes before EMS arrived.  EMS reports he was still unresponsive on their arrival.  Patient was opening eyes but would not follow commands and would not answer questions.  Patient is now answering questions appropriately, following commands, and has no complaints at this time. ROS: Patient currently denies any vision changes, tinnitus, difficulty speaking, facial droop, sore throat, chest pain, shortness of breath, abdominal pain, nausea/vomiting/diarrhea, dysuria, or weakness/numbness/paresthesias in any extremity   Physical Exam  Triage Vital Signs: ED Triage Vitals  Encounter Vitals Group     BP 09/19/23 1437 117/84     Systolic BP Percentile --      Diastolic BP Percentile --      Pulse Rate 09/19/23 1437 (!) 104     Resp 09/19/23 1437 18     Temp 09/19/23 1437 97.7 F (36.5 C)     Temp Source 09/19/23 1437 Oral     SpO2 09/19/23 1437 94 %     Weight 09/19/23 1432 200 lb (90.7 kg)     Height 09/19/23 1432 6' (1.829 m)     Head Circumference --      Peak Flow --      Pain Score 09/19/23 1432 5     Pain Loc --      Pain Education --      Exclude from Growth Chart --    Most recent vital signs: Vitals:   09/19/23 1437  BP: 117/84  Pulse: (!) 104  Resp: 18  Temp: 97.7 F (36.5 C)  SpO2: 94%   General: Awake, oriented x4. CV:  Good peripheral perfusion.  Resp:  Normal effort.  Abd:  No distention.  Other:  Middle-aged overweight Caucasian male resting comfortably in no acute distress ED  Results / Procedures / Treatments  Labs (all labs ordered are listed, but only abnormal results are displayed) Labs Reviewed  CBC - Abnormal; Notable for the following components:      Result Value   WBC 12.8 (*)    All other components within normal limits  VALPROIC ACID  LEVEL  BASIC METABOLIC PANEL WITH GFR  TROPONIN I (HIGH SENSITIVITY)  TROPONIN I (HIGH SENSITIVITY)  RADIOLOGY ED MD interpretation: One-view portable chest x-ray interpreted by me shows no evidence of acute abnormalities including no pneumonia, pneumothorax, or widened mediastinum -Agree with radiology assessment Official radiology report(s): DG Chest 1 View Result Date: 09/19/2023 CLINICAL DATA:  696295 Chest pain 284132 EXAM: CHEST  1 VIEW COMPARISON:  August 22, 2023 FINDINGS: The left costophrenic sulcus is excluded from the field of view. Low lung volumes. Mild persistent elevation of the right hemidiaphragm. No focal airspace consolidation, pleural effusion, or pneumothorax. No cardiomegaly. No acute fracture or destructive lesion. IMPRESSION: The left costophrenic sulcus is excluded from the field of view. Otherwise, no acute cardiopulmonary abnormality. Electronically Signed   By: Rance Burrows M.D.   On: 09/19/2023 15:28   PROCEDURES: Critical Care performed: No Procedures MEDICATIONS ORDERED IN ED: Medications - No data to display IMPRESSION / MDM / ASSESSMENT  AND PLAN / ED COURSE  I reviewed the triage vital signs and the nursing notes.                             The patient is on the cardiac monitor to evaluate for evidence of arrhythmia and/or significant heart rate changes. Patient's presentation is most consistent with acute presentation with potential threat to life or bodily function. Patient presents after recent seizure episode.  Patient had slow return to baseline mental and physical function per caregiver. No immunosuppresion hx and had no preceding fever. No history of alcohol  abuse or  suspicion for toxin ingestion. Unlikely stroke, syncope. Unlikely infectious etiology. No preceding trauma.  Workup: EKG, BMP, POCT glucose (pregnancy test if male) and CT Brain.  Field Interventions: None ED Interventions: none Disposition: Discharge home with primary care follow up in next 24-48 hours.   FINAL CLINICAL IMPRESSION(S) / ED DIAGNOSES   Final diagnoses:  Seizure-like activity (HCC)   Rx / DC Orders   ED Discharge Orders     None      Note:  This document was prepared using Dragon voice recognition software and may include unintentional dictation errors.   Felica Chargois K, MD 09/19/23 [phone]

## 2023-09-19 NOTE — ED Triage Notes (Signed)
 Arrived by EMS. Started c/o of headache on the way back to group home and then became unresponsive for about 10 minutes before EMS arrived. EMS reports still unresponsive upon their arrival. Upon opening eyes he wouldn't follow commands and non verbal. Patient now following commands and speaking upon arrival to ER. C/o headache and chest pain with EMS  Patient was just discharged this AM from [facility] after being admitted for seizures  EMS vitals: 126/78 b/p 96% RA  20G R hand

## 2023-09-21 ENCOUNTER — Other Ambulatory Visit: Payer: Self-pay

## 2023-09-21 ENCOUNTER — Emergency Department

## 2023-09-21 ENCOUNTER — Emergency Department
Admission: EM | Admit: 2023-09-21 | Discharge: 2023-09-22 | Disposition: A | Discharge status: Other Acute Inpatient Hospital | Attending: Emergency Medicine | Admitting: Emergency Medicine

## 2023-09-21 ENCOUNTER — Encounter: Payer: Self-pay | Admitting: Emergency Medicine

## 2023-09-21 ENCOUNTER — Encounter (HOSPITAL_COMMUNITY): Payer: Self-pay

## 2023-09-21 DIAGNOSIS — Z87820 Personal history of traumatic brain injury: Secondary | ICD-10-CM

## 2023-09-21 DIAGNOSIS — R0689 Other abnormalities of breathing: Secondary | ICD-10-CM | POA: Diagnosis not present

## 2023-09-21 DIAGNOSIS — R0789 Other chest pain: Secondary | ICD-10-CM | POA: Diagnosis not present

## 2023-09-21 DIAGNOSIS — R55 Syncope and collapse: Secondary | ICD-10-CM | POA: Diagnosis not present

## 2023-09-21 DIAGNOSIS — J9811 Atelectasis: Secondary | ICD-10-CM | POA: Diagnosis not present

## 2023-09-21 DIAGNOSIS — G40909 Epilepsy, unspecified, not intractable, without status epilepticus: Secondary | ICD-10-CM | POA: Diagnosis present

## 2023-09-21 DIAGNOSIS — M50322 Other cervical disc degeneration at C5-C6 level: Secondary | ICD-10-CM | POA: Diagnosis not present

## 2023-09-21 DIAGNOSIS — J45909 Unspecified asthma, uncomplicated: Secondary | ICD-10-CM | POA: Diagnosis not present

## 2023-09-21 DIAGNOSIS — R404 Transient alteration of awareness: Secondary | ICD-10-CM | POA: Diagnosis not present

## 2023-09-21 DIAGNOSIS — R569 Unspecified convulsions: Principal | ICD-10-CM

## 2023-09-21 DIAGNOSIS — M4802 Spinal stenosis, cervical region: Secondary | ICD-10-CM | POA: Diagnosis not present

## 2023-09-21 DIAGNOSIS — R079 Chest pain, unspecified: Secondary | ICD-10-CM | POA: Diagnosis not present

## 2023-09-21 DIAGNOSIS — J439 Emphysema, unspecified: Secondary | ICD-10-CM | POA: Diagnosis not present

## 2023-09-21 DIAGNOSIS — S199XXA Unspecified injury of neck, initial encounter: Secondary | ICD-10-CM | POA: Diagnosis not present

## 2023-09-21 DIAGNOSIS — R9389 Abnormal findings on diagnostic imaging of other specified body structures: Secondary | ICD-10-CM | POA: Diagnosis not present

## 2023-09-21 LAB — CBC WITH DIFFERENTIAL/PLATELET
Abs Immature Granulocytes: 0.03 10*3/uL (ref 0.00–0.07)
Basophils Absolute: 0 10*3/uL (ref 0.0–0.1)
Basophils Relative: 1 %
Eosinophils Absolute: 0.3 10*3/uL (ref 0.0–0.5)
Eosinophils Relative: 4 %
HCT: 39.6 % (ref 39.0–52.0)
Hemoglobin: 12.9 g/dL — ABNORMAL LOW (ref 13.0–17.0)
Immature Granulocytes: 0 %
Lymphocytes Relative: 30 %
Lymphs Abs: 2.5 10*3/uL (ref 0.7–4.0)
MCH: 30.8 pg (ref 26.0–34.0)
MCHC: 32.6 g/dL (ref 30.0–36.0)
MCV: 94.5 fL (ref 80.0–100.0)
Monocytes Absolute: 1.1 10*3/uL — ABNORMAL HIGH (ref 0.1–1.0)
Monocytes Relative: 13 %
Neutro Abs: 4.3 10*3/uL (ref 1.7–7.7)
Neutrophils Relative %: 52 %
Platelets: 314 10*3/uL (ref 150–400)
RBC: 4.19 MIL/uL — ABNORMAL LOW (ref 4.22–5.81)
RDW: 12.5 % (ref 11.5–15.5)
WBC: 8.2 10*3/uL (ref 4.0–10.5)
nRBC: 0 % (ref 0.0–0.2)

## 2023-09-21 LAB — COMPREHENSIVE METABOLIC PANEL WITH GFR
ALT: 27 U/L (ref 0–44)
AST: 29 U/L (ref 15–41)
Albumin: 3.8 g/dL (ref 3.5–5.0)
Alkaline Phosphatase: 56 U/L (ref 38–126)
Anion gap: 7 (ref 5–15)
BUN: 12 mg/dL (ref 6–20)
CO2: 26 mmol/L (ref 22–32)
Calcium: 8.9 mg/dL (ref 8.9–10.3)
Chloride: 104 mmol/L (ref 98–111)
Creatinine, Ser: 0.82 mg/dL (ref 0.61–1.24)
GFR, Estimated: >60 mL/min (ref >60)
Glucose, Bld: 88 mg/dL (ref 70–99)
Potassium: 4.1 mmol/L (ref 3.5–5.1)
Sodium: 137 mmol/L (ref 135–145)
Total Bilirubin: 0.5 mg/dL (ref 0.0–1.2)
Total Protein: 7.6 g/dL (ref 6.5–8.1)

## 2023-09-21 LAB — LIPASE, BLOOD: Lipase: 36 U/L (ref 11–51)

## 2023-09-21 LAB — VALPROIC ACID LEVEL: Valproic Acid Lvl: 57 ug/mL (ref 50–100)

## 2023-09-21 LAB — TROPONIN I (HIGH SENSITIVITY): Troponin I (High Sensitivity): 2 ng/L (ref <18)

## 2023-09-21 LAB — LACOSAMIDE: Lacosamide: 5.7 ug/mL (ref 5.0–10.0)

## 2023-09-21 MED ORDER — CLOZAPINE 100 MG PO TABS
300.0000 mg | ORAL_TABLET | Freq: Every day | ORAL | Status: DC
  Administered 2023-09-21 – 2023-09-22 (×2): 300 mg via ORAL
  Filled 2023-09-21 (×2): qty 3

## 2023-09-21 MED ORDER — CLOZAPINE 200 MG PO TBDP: 200.0000 mg | ORAL_TABLET | Freq: Every day | ORAL | Status: DC

## 2023-09-21 MED ORDER — LEVOTHYROXINE SODIUM 50 MCG PO TABS
25.0000 ug | ORAL_TABLET | Freq: Every day | ORAL | Status: DC
  Administered 2023-09-22: 25 ug via ORAL
  Filled 2023-09-21: qty 1

## 2023-09-21 MED ORDER — DIVALPROEX SODIUM 500 MG PO DR TAB
500.0000 mg | DELAYED_RELEASE_TABLET | Freq: Two times a day (BID) | ORAL | Status: DC
  Administered 2023-09-21 – 2023-09-22 (×4): 500 mg via ORAL
  Filled 2023-09-21 (×4): qty 1

## 2023-09-21 MED ORDER — CLOZAPINE 100 MG PO TABS: 100.0000 mg | ORAL_TABLET | Freq: Every day | ORAL | Status: DC

## 2023-09-21 MED ORDER — FLUTICASONE PROPIONATE 50 MCG/ACT NA SUSP
2.0000 | Freq: Every day | NASAL | Status: DC
  Administered 2023-09-21: 2 via NASAL
  Filled 2023-09-21: qty 16

## 2023-09-21 MED ORDER — HYDROXYZINE HCL 25 MG PO TABS
25.0000 mg | ORAL_TABLET | Freq: Two times a day (BID) | ORAL | Status: DC | PRN
  Administered 2023-09-21: 50 mg via ORAL
  Filled 2023-09-21: qty 2

## 2023-09-21 MED ORDER — LACOSAMIDE 50 MG PO TABS
200.0000 mg | ORAL_TABLET | Freq: Two times a day (BID) | ORAL | Status: DC
  Administered 2023-09-21 – 2023-09-22 (×4): 200 mg via ORAL
  Filled 2023-09-21 (×4): qty 4

## 2023-09-21 MED ORDER — PANTOPRAZOLE SODIUM 40 MG PO TBEC
40.0000 mg | DELAYED_RELEASE_TABLET | Freq: Every day | ORAL | Status: DC
  Administered 2023-09-21 – 2023-09-22 (×2): 40 mg via ORAL
  Filled 2023-09-21 (×2): qty 1

## 2023-09-21 MED ORDER — PALIPERIDONE ER 3 MG PO TB24
12.0000 mg | ORAL_TABLET | Freq: Every morning | ORAL | Status: DC
  Administered 2023-09-21 – 2023-09-22 (×2): 12 mg via ORAL
  Filled 2023-09-21 (×3): qty 4

## 2023-09-21 MED ORDER — MIRABEGRON ER 50 MG PO TB24
50.0000 mg | ORAL_TABLET | Freq: Every day | ORAL | Status: DC
  Administered 2023-09-21 – 2023-09-22 (×2): 50 mg via ORAL
  Filled 2023-09-21 (×2): qty 1

## 2023-09-21 MED ORDER — SERTRALINE HCL 50 MG PO TABS
150.0000 mg | ORAL_TABLET | Freq: Every day | ORAL | Status: DC
Start: 2023-09-21 — End: 2023-09-22
  Administered 2023-09-21 – 2023-09-22 (×2): 150 mg via ORAL
  Filled 2023-09-21 (×2): qty 3

## 2023-09-21 MED ORDER — PROPRANOLOL HCL 20 MG PO TABS
10.0000 mg | ORAL_TABLET | Freq: Every day | ORAL | Status: DC
  Administered 2023-09-21 – 2023-09-22 (×2): 10 mg via ORAL
  Filled 2023-09-21 (×2): qty 1

## 2023-09-21 MED ORDER — ZONISAMIDE 25 MG PO CAPS
50.0000 mg | ORAL_CAPSULE | Freq: Every day | ORAL | Status: DC
  Administered 2023-09-22: 50 mg via ORAL
  Filled 2023-09-21 (×2): qty 2

## 2023-09-21 NOTE — ED Notes (Signed)
 Pt was given multiple cups of coffee throughout day shift both Holiday representative and this Clinical research associate let Pt know that he would be given one last decaf coffee for tn and the the rest of the night would be either water or soda of pt choice.

## 2023-09-21 NOTE — ED Notes (Signed)
 Food tray ordered
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
## 2023-09-21 NOTE — ED Triage Notes (Signed)
 Pt via [facility] from [facility] reports witnessed seizure. Pt has a hx of absent seizure. EMS witnessed seizure for about 10-15 seconds. Reports that he was just seen for same on Wednesday. On arrival, pt is alert but not speaking but able to answer questions by writing them down.

## 2023-09-21 NOTE — ED Provider Notes (Signed)
 [facility] Provider Note    Event Date/Time   First MD Initiated Contact with Patient 09/21/23 1129     (approximate)   History   Chief Complaint Seizures   HPI  Eddie Berry is a 38 y.o. male with past medical history of TBI, seizures, schizophrenia, and asthma who presents to the ED for seizures.  Per EMS, patient noted to have seizure episode earlier today by staff at his group home.  This was described as patient staring off and not responding, lasting for a couple of minutes.  Episodes seem to resolve, however patient had another episode lasting 10 to 15 seconds shortly after EMS arrival.  He is reportedly verbal at baseline, but has been nonverbal with EMS, requesting pen and paper to communicate on arrival to the ED.  He states he has been taking his medications as prescribed, complains of pain across his chest and abdomen as well as headache and neck pain.  He is unsure whether he fell with the episode.     Physical Exam   Triage Vital Signs: ED Triage Vitals [09/21/23 1130]  Encounter Vitals Group     BP      Systolic BP Percentile      Diastolic BP Percentile      Pulse      Resp      Temp      Temp src      SpO2      Weight 198 lb 6.6 oz (90 kg)     Height 6' (1.829 m)     Head Circumference      Peak Flow      Pain Score 0     Pain Loc      Pain Education      Exclude from Growth Chart     Most recent vital signs: Vitals:   09/21/23 1130 09/21/23 1242  BP: 122/88 (!) 134/90  Pulse: 97 97  Resp: (!) 9 (!) 22  Temp: 98.3 F (36.8 C)   SpO2: 96% 95%    Constitutional: Awake and alert, nonverbal. Eyes: Conjunctivae are normal. Head: Atraumatic. Nose: No congestion/rhinnorhea. Mouth/Throat: Mucous membranes are moist.  Neck: Midline cervical spine tenderness to palpation noted. Cardiovascular: Normal rate, regular rhythm. Grossly normal heart sounds.  2+ radial pulses bilaterally. Respiratory: Normal respiratory  effort.  No retractions. Lungs CTAB. Gastrointestinal: Soft and nontender. No distention. Musculoskeletal: No lower extremity tenderness nor edema.  Neurologic: Patient not able to speak but able to communicate via pen and paper.  Chronic right facial droop noted, no other focal deficits.    ED Results / Procedures / Treatments   Labs (all labs ordered are listed, but only abnormal results are displayed) Labs Reviewed  CBC WITH DIFFERENTIAL/PLATELET - Abnormal; Notable for the following components:      Result Value   RBC 4.19 (*)    Hemoglobin 12.9 (*)    Monocytes Absolute 1.1 (*)    All other components within normal limits  COMPREHENSIVE METABOLIC PANEL WITH GFR  LIPASE, BLOOD  VALPROIC ACID  LEVEL  TROPONIN I (HIGH SENSITIVITY)  TROPONIN I (HIGH SENSITIVITY)     EKG  ED ECG REPORT I, Twilla Galea, the attending physician, personally viewed and interpreted this ECG.   Date: 09/21/2023  EKG Time: 11:32  Rate: 97  Rhythm: normal sinus rhythm  Axis: Normal  Intervals:none  ST&T Change: None  RADIOLOGY CT head reviewed and interpreted by me with no hemorrhage or midline shift.  PROCEDURES:  Critical Care performed: No  Procedures   MEDICATIONS ORDERED IN ED: Medications - No data to display   IMPRESSION / MDM / ASSESSMENT AND PLAN / ED COURSE  I reviewed the triage vital signs and the nursing notes.                              38 y.o. male with past medical history of TBI, schizophrenia, seizures, and asthma who presents to the ED following reported absence seizure at his group home followed by additional episode with EMS.  Patient's presentation is most consistent with acute presentation with potential threat to life or bodily function.  Differential diagnosis includes, but is not limited to, absence seizure, generalized seizure, status epilepticus, medication noncompliance, breakthrough seizure, electrolyte abnormality, arrhythmia.  Patient  nontoxic-appearing and in no acute distress, vital signs are unremarkable.  I did review patient's recent discharge summary, where he was admitted for multiple complex partial seizures that sound similar to what he has experienced today.  Depakote  dose was increased at that time after previously being lowered due to daytime somnolence.  Patient awake and alert at this time, communicating via pen and paper, no evidence of ongoing seizure.  Unclear whether he had a fall and we will check CT head and cervical spine.  He also complains of pain in his chest and abdomen, however no pain noted on palpation.  We will screen EKG and labs including troponin.  CT head and cervical spine are negative for acute process, chest x-ray also unremarkable.  Labs are reassuring with no significant anemia, leukocytosis, tract abnormality, or AKI.  Troponin within normal limits, Depakote  level therapeutic.  Patient evaluated by Dr. Renaee Caro of neurology, who recommends transfer to [facility] for longer-term EEG monitoring, PNES remains on the differential and he does not recommend any medication changes at this time.  Case discussed with Dr. Oris Birmingham of the hospitalist service at [place], who accepted patient for transfer.  Dr. Lindzen will plan to reach out to neurology at [facility] as well.      FINAL CLINICAL IMPRESSION(S) / ED DIAGNOSES   Final diagnoses:  Seizures (HCC)     Rx / DC Orders   ED Discharge Orders     None        Note:  This document was prepared using Dragon voice recognition software and may include unintentional dictation errors.   Twilla Galea, MD 09/21/23 [id number]

## 2023-09-22 ENCOUNTER — Inpatient Hospital Stay (HOSPITAL_COMMUNITY)
Admission: AD | Admit: 2023-09-22 | Discharge: 2023-09-24 | DRG: 101 | Disposition: A | Discharge status: Home / Self Care | Attending: Internal Medicine | Admitting: Internal Medicine

## 2023-09-22 DIAGNOSIS — J45909 Unspecified asthma, uncomplicated: Secondary | ICD-10-CM | POA: Diagnosis not present

## 2023-09-22 DIAGNOSIS — F419 Anxiety disorder, unspecified: Secondary | ICD-10-CM | POA: Diagnosis present

## 2023-09-22 DIAGNOSIS — G40209 Localization-related (focal) (partial) symptomatic epilepsy and epileptic syndromes with complex partial seizures, not intractable, without status epilepticus: Principal | ICD-10-CM | POA: Diagnosis present

## 2023-09-22 DIAGNOSIS — Z886 Allergy status to analgesic agent status: Secondary | ICD-10-CM | POA: Diagnosis not present

## 2023-09-22 DIAGNOSIS — Z87891 Personal history of nicotine dependence: Secondary | ICD-10-CM

## 2023-09-22 DIAGNOSIS — Z888 Allergy status to other drugs, medicaments and biological substances status: Secondary | ICD-10-CM | POA: Diagnosis not present

## 2023-09-22 DIAGNOSIS — H5500 Unspecified nystagmus: Secondary | ICD-10-CM | POA: Diagnosis present

## 2023-09-22 DIAGNOSIS — G9389 Other specified disorders of brain: Secondary | ICD-10-CM | POA: Diagnosis present

## 2023-09-22 DIAGNOSIS — Z9089 Acquired absence of other organs: Secondary | ICD-10-CM

## 2023-09-22 DIAGNOSIS — Z9889 Other specified postprocedural states: Secondary | ICD-10-CM | POA: Diagnosis not present

## 2023-09-22 DIAGNOSIS — X58XXXA Exposure to other specified factors, initial encounter: Secondary | ICD-10-CM | POA: Diagnosis present

## 2023-09-22 DIAGNOSIS — R569 Unspecified convulsions: Secondary | ICD-10-CM | POA: Diagnosis not present

## 2023-09-22 DIAGNOSIS — T7691XA Unspecified adult maltreatment, suspected, initial encounter: Secondary | ICD-10-CM | POA: Diagnosis present

## 2023-09-22 DIAGNOSIS — G4089 Other seizures: Secondary | ICD-10-CM | POA: Diagnosis not present

## 2023-09-22 DIAGNOSIS — R29810 Facial weakness: Secondary | ICD-10-CM | POA: Diagnosis present

## 2023-09-22 DIAGNOSIS — Z87820 Personal history of traumatic brain injury: Secondary | ICD-10-CM | POA: Diagnosis not present

## 2023-09-22 DIAGNOSIS — G40909 Epilepsy, unspecified, not intractable, without status epilepticus: Secondary | ICD-10-CM | POA: Diagnosis not present

## 2023-09-22 DIAGNOSIS — Z880 Allergy status to penicillin: Secondary | ICD-10-CM | POA: Diagnosis not present

## 2023-09-22 DIAGNOSIS — Z79899 Other long term (current) drug therapy: Secondary | ICD-10-CM | POA: Diagnosis not present

## 2023-09-22 DIAGNOSIS — Z79890 Hormone replacement therapy (postmenopausal): Secondary | ICD-10-CM | POA: Diagnosis not present

## 2023-09-22 DIAGNOSIS — E039 Hypothyroidism, unspecified: Secondary | ICD-10-CM | POA: Diagnosis present

## 2023-09-22 DIAGNOSIS — J45901 Unspecified asthma with (acute) exacerbation: Secondary | ICD-10-CM | POA: Diagnosis present

## 2023-09-22 DIAGNOSIS — F209 Schizophrenia, unspecified: Secondary | ICD-10-CM | POA: Diagnosis present

## 2023-09-22 DIAGNOSIS — F840 Autistic disorder: Secondary | ICD-10-CM | POA: Diagnosis present

## 2023-09-22 DIAGNOSIS — Z593 Problems related to living in residential institution: Secondary | ICD-10-CM

## 2023-09-22 MED ORDER — LEVOTHYROXINE SODIUM 25 MCG PO TABS
25.0000 ug | ORAL_TABLET | Freq: Every day | ORAL | Status: DC
  Administered 2023-09-23 – 2023-09-24 (×2): 25 ug via ORAL
  Filled 2023-09-22 (×2): qty 1

## 2023-09-22 MED ORDER — ACETAMINOPHEN 325 MG PO TABS: 650.0000 mg | ORAL_TABLET | Freq: Four times a day (QID) | ORAL | Status: DC | PRN

## 2023-09-22 MED ORDER — ENOXAPARIN SODIUM 40 MG/0.4ML IJ SOSY
40.0000 mg | PREFILLED_SYRINGE | Freq: Every day | INTRAMUSCULAR | Status: DC
  Administered 2023-09-23 – 2023-09-24 (×2): 40 mg via SUBCUTANEOUS
  Filled 2023-09-22 (×2): qty 0.4

## 2023-09-22 MED ORDER — PROCHLORPERAZINE EDISYLATE 10 MG/2ML IJ SOLN: 5.0000 mg | Freq: Four times a day (QID) | INTRAMUSCULAR | Status: DC | PRN

## 2023-09-22 MED ORDER — DIVALPROEX SODIUM 250 MG PO DR TAB
500.0000 mg | DELAYED_RELEASE_TABLET | Freq: Two times a day (BID) | ORAL | Status: DC
Start: 2023-09-23 — End: 2023-09-24
  Administered 2023-09-23 – 2023-09-24 (×4): 500 mg via ORAL
  Filled 2023-09-22 (×4): qty 2

## 2023-09-22 MED ORDER — MELATONIN 5 MG PO TABS
5.0000 mg | ORAL_TABLET | Freq: Every evening | ORAL | Status: DC | PRN
Start: 2023-09-22 — End: 2023-09-24
  Administered 2023-09-23: 5 mg via ORAL
  Filled 2023-09-22: qty 1

## 2023-09-22 MED ORDER — ZONISAMIDE 25 MG PO CAPS
50.0000 mg | ORAL_CAPSULE | Freq: Every day | ORAL | Status: DC
  Administered 2023-09-23 – 2023-09-24 (×3): 50 mg via ORAL
  Filled 2023-09-22 (×3): qty 2

## 2023-09-22 MED ORDER — LORAZEPAM 2 MG/ML IJ SOLN: 2.0000 mg | Freq: Four times a day (QID) | INTRAMUSCULAR | Status: DC | PRN

## 2023-09-22 MED ORDER — LACTATED RINGERS IV SOLN: INTRAVENOUS | Status: AC

## 2023-09-22 MED ORDER — POLYETHYLENE GLYCOL 3350 17 G PO PACK: 17.0000 g | PACK | Freq: Every day | ORAL | Status: DC | PRN

## 2023-09-22 MED ORDER — LACOSAMIDE 200 MG PO TABS
200.0000 mg | ORAL_TABLET | Freq: Two times a day (BID) | ORAL | Status: DC
  Administered 2023-09-23 – 2023-09-24 (×4): 200 mg via ORAL
  Filled 2023-09-22 (×4): qty 1
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
## 2023-09-22 NOTE — ED Notes (Signed)
 Pt reporting to ED initially d/t seizure. Pt has not had a seizure during the last shift and is currently awaiting transfer to OSH. Pt alert and oriented. Pt has hx of developmental delay and behaving appropriately. Pt ABCs intact. RR even and unlabored. Pt in NAD. Bed in lowest locked position. Call bell in reach. Pt ambulatory. Pt requesting a cup of coffee. RN informed by outgoing RN that the pt requested and was provided several cups of coffee during the last shift. With pt HR being just above 100, pt informed that he will not be provided with any coffee this evening but he can have water and juice.   Past Medical History:  Diagnosis Date   Allergy    Asthma    Autism    Blood transfusion without reported diagnosis    Seizures (HCC)    TBI (traumatic brain injury) (HCC)

## 2023-09-22 NOTE — ED Notes (Signed)
 EMTALA: REQUIRED DOCUMENTATION COMPLETED AND REVIEWED BY WRITER PRIOR TO PT TRANSFER MD REASSESSMENT EMTALA RN SECTION TRANSFER E-SIGN by verbal legal guardian with AJ, RN and Lexie, RN VS WITHIN REQUIRED TIME
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
## 2023-09-22 NOTE — ED Notes (Addendum)
 Pt legal guardian, Moira Andrews, called for an update on pts status. He was informed on confirmation of bed placement at [facility], just awaiting confirmation on transportation.

## 2023-09-22 NOTE — ED Notes (Addendum)
 This ED RN called and gave report to Justino Ona, RN at [facility]. No questions or concerns at this time. Pt legal guardian aware and gave consent for transfer. EMTALA documentation filled out and confirmed with Charge RN Sherian Dimitri. Pt ABCs intact. RR even and unlabored. Pt in NAD.

## 2023-09-22 NOTE — ED Notes (Signed)
 CALLED  [facility]  PT  STILL ON  WAITLIST  FOR  BED

## 2023-09-22 NOTE — ED Notes (Signed)
 Pt transported with [facility] to [place] at this time.

## 2023-09-22 NOTE — ED Notes (Signed)
 Pt ambulated to bathroom in the hallway without assistance from staff. Pt returned to room and bed without assistance from staff and was reconnected to the VS monitor. Pt tolerated activity well.
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
## 2023-09-22 NOTE — ED Notes (Signed)
 Pt provided with dinner tray that was found in the ED Quad area. Pt tray warmed up for 30 seconds by this RN. PT provided with 2 cups of orange juice as well. Pt ABCs intact. RR even and unlabored. Pt in NAD. Bed in lowest locked position. Call bell in reach. Denies further needs at this time.

## 2023-09-23 ENCOUNTER — Inpatient Hospital Stay (HOSPITAL_COMMUNITY)

## 2023-09-23 ENCOUNTER — Other Ambulatory Visit: Payer: Self-pay | Admitting: Family Medicine

## 2023-09-23 DIAGNOSIS — J45909 Unspecified asthma, uncomplicated: Secondary | ICD-10-CM | POA: Diagnosis not present

## 2023-09-23 DIAGNOSIS — F419 Anxiety disorder, unspecified: Secondary | ICD-10-CM | POA: Diagnosis not present

## 2023-09-23 DIAGNOSIS — R569 Unspecified convulsions: Secondary | ICD-10-CM | POA: Diagnosis not present

## 2023-09-23 LAB — CBC
HCT: 41.9 % (ref 39.0–52.0)
Hemoglobin: 13.5 g/dL (ref 13.0–17.0)
MCH: 30.5 pg (ref 26.0–34.0)
MCHC: 32.2 g/dL (ref 30.0–36.0)
MCV: 94.6 fL (ref 80.0–100.0)
Platelets: 325 10*3/uL (ref 150–400)
RBC: 4.43 MIL/uL (ref 4.22–5.81)
RDW: 12.6 % (ref 11.5–15.5)
WBC: 7.2 10*3/uL (ref 4.0–10.5)
nRBC: 0 % (ref 0.0–0.2)

## 2023-09-23 LAB — BASIC METABOLIC PANEL WITH GFR
Anion gap: 11 (ref 5–15)
BUN: 10 mg/dL (ref 6–20)
CO2: 25 mmol/L (ref 22–32)
Calcium: 9.2 mg/dL (ref 8.9–10.3)
Chloride: 104 mmol/L (ref 98–111)
Creatinine, Ser: 0.79 mg/dL (ref 0.61–1.24)
GFR, Estimated: >60 mL/min (ref >60)
Glucose, Bld: 83 mg/dL (ref 70–99)
Potassium: 3.8 mmol/L (ref 3.5–5.1)
Sodium: 140 mmol/L (ref 135–145)

## 2023-09-23 LAB — PHOSPHORUS: Phosphorus: 3 mg/dL (ref 2.5–4.6)

## 2023-09-23 LAB — MAGNESIUM: Magnesium: 1.7 mg/dL (ref 1.7–2.4)

## 2023-09-23 MED ORDER — CLOZAPINE 100 MG PO TABS
300.0000 mg | ORAL_TABLET | Freq: Every day | ORAL | Status: DC
  Administered 2023-09-23 – 2023-09-24 (×2): 300 mg via ORAL
  Filled 2023-09-23 (×2): qty 3

## 2023-09-23 MED ORDER — IPRATROPIUM-ALBUTEROL 0.5-2.5 (3) MG/3ML IN SOLN: 3.0000 mL | Freq: Four times a day (QID) | RESPIRATORY_TRACT | Status: DC | PRN

## 2023-09-23 MED ORDER — METHYLPREDNISOLONE SODIUM SUCC 40 MG IJ SOLR
40.0000 mg | Freq: Two times a day (BID) | INTRAMUSCULAR | Status: DC
  Administered 2023-09-23 – 2023-09-24 (×3): 40 mg via INTRAVENOUS
  Filled 2023-09-23 (×3): qty 1

## 2023-09-23 MED ORDER — HYDROXYZINE HCL 25 MG PO TABS: 25.0000 mg | ORAL_TABLET | Freq: Two times a day (BID) | ORAL | Status: DC | PRN

## 2023-09-23 MED ORDER — CLOZAPINE 200 MG PO TBDP: 200.0000 mg | ORAL_TABLET | Freq: Every day | ORAL | Status: DC

## 2023-09-23 MED ORDER — METOPROLOL TARTRATE 5 MG/5ML IV SOLN
5.0000 mg | INTRAVENOUS | Status: DC | PRN
  Administered 2023-09-23: 5 mg via INTRAVENOUS
  Filled 2023-09-23 (×2): qty 5

## 2023-09-23 NOTE — Procedures (Addendum)
 Patient Name: Raheem Kolbe  MRN: [id number]  Epilepsy Attending: Arleene Lack  Referring Physician/Provider: Khaliqdina, Salman, MD  Duration: 09/23/2023 0206 to 09/24/2023 0206   Patient history: 38yo M with seizure like activity. EEG to evaluate for seizure.   Level of alertness: Awake, asleep   AEDs during EEG study: VPA, LCM, ZNS   Technical aspects: This EEG study was done with scalp electrodes positioned according to the 10-20 International system of electrode placement. Electrical activity was reviewed with band pass filter of 1-70Hz , sensitivity of 7 uV/mm, display speed of 12mm/sec with a 60Hz  notched filter applied as appropriate. EEG data were recorded continuously and digitally stored.  Video monitoring was available and reviewed as appropriate.   Description: The posterior dominant rhythm consists of 8-9 Hz activity of moderate voltage (25-35 uV) seen predominantly in posterior head regions, symmetric and reactive to eye opening and eye closing.  Sleep was characterized by vertex waves, sleep symptoms (12 to 14 Hz), maximal frontocentral region.  Hyperventilation and photic stimulation were not performed.      IMPRESSION: This study is within normal limits. No seizures or epileptiform discharges were seen throughout the recording.   A normal interictal EEG does not exclude the diagnosis of epilepsy.   Chany Woolworth O Wali Reinheimer
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
## 2023-09-23 NOTE — Progress Notes (Signed)
 LTM EEG hooked up and running - no initial skin breakdown - push button tested - [facility] monitoring.

## 2023-09-23 NOTE — Progress Notes (Signed)
 TR[facility] night cross cover note:   I was notified by the patient's RN that the patient continues to exhibit evidence of sinus tachycardia, with most recent heart rate sustained in the 120s, and appearing to be consistent with sinus tachycardia on the monitor.  Not associated with any new symptoms.  Other vital signs appear stable, including afebrile; cell blood pressures in the 130s mmHg; respiratory rate 18-20, oxygen saturation 96 to 98% on room air.  I subsequently added as needed IV Lopressor for sustained heart rates > 120 bpm.      Camelia Cavalier, DO Hospitalist

## 2023-09-23 NOTE — Procedures (Signed)
 Patient Name: Eddie Berry  MRN: [id number]  Epilepsy Attending: Arleene Lack  Referring Physician/Provider: Bary Boss, DO  Date: 09/23/2023 Duration: 28.11 mins  Patient history: 38yo M with seizure like activity. EEG to evaluate for seizure.  Level of alertness: Awake  AEDs during EEG study: VPA, LCM, ZNS  Technical aspects: This EEG study was done with scalp electrodes positioned according to the 10-20 International system of electrode placement. Electrical activity was reviewed with band pass filter of 1-70Hz , sensitivity of 7 uV/mm, display speed of 69mm/sec with a 60Hz  notched filter applied as appropriate. EEG data were recorded continuously and digitally stored.  Video monitoring was available and reviewed as appropriate.  Description: The posterior dominant rhythm consists of 8-9 Hz activity of moderate voltage (25-35 uV) seen predominantly in posterior head regions, symmetric and reactive to eye opening and eye closing. Hyperventilation and photic stimulation were not performed.     IMPRESSION: This study is within normal limits. No seizures or epileptiform discharges were seen throughout the recording.  A normal interictal EEG does not exclude the diagnosis of epilepsy.  Analisa Sledd O Joal Eakle
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
## 2023-09-23 NOTE — Progress Notes (Signed)
 EEG complete - results pending

## 2023-09-24 ENCOUNTER — Other Ambulatory Visit: Payer: Self-pay | Admitting: Family Medicine

## 2023-09-24 ENCOUNTER — Inpatient Hospital Stay (HOSPITAL_COMMUNITY)

## 2023-09-24 ENCOUNTER — Encounter (HOSPITAL_COMMUNITY)

## 2023-09-24 DIAGNOSIS — R569 Unspecified convulsions: Secondary | ICD-10-CM | POA: Diagnosis not present

## 2023-09-24 DIAGNOSIS — J45909 Unspecified asthma, uncomplicated: Secondary | ICD-10-CM | POA: Diagnosis not present

## 2023-09-24 MED ORDER — CLOZAPINE 100 MG PO TABS: 300.0000 mg | ORAL_TABLET | Freq: Every day | ORAL | Status: DC

## 2023-09-24 MED ORDER — IPRATROPIUM-ALBUTEROL 0.5-2.5 (3) MG/3ML IN SOLN
3.0000 mL | Freq: Four times a day (QID) | RESPIRATORY_TRACT | 2 refills | Status: AC | PRN
Start: 2023-09-24 — End: ?

## 2023-09-24 MED ORDER — CLOZAPINE 100 MG PO TABS: 300.0000 mg | ORAL_TABLET | Freq: Every day | ORAL | Status: AC

## 2023-09-24 MED ORDER — ZONISAMIDE 50 MG PO CAPS: 50.0000 mg | ORAL_CAPSULE | Freq: Every day | ORAL | 2 refills | Status: AC
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
## 2023-09-24 NOTE — TOC Transition Note (Signed)
 Transition of Care [facility]) - Discharge Note   Patient Details  Name: Eddie Berry MRN: [id number] Date of Birth: 10-Feb-1986  [facility] CM/SW Contact:  Tandy Fam, LCSW Phone Number: 09/24/2023, 2:39 PM   Clinical Narrative:   CSW notified by MD that patient is stable to return to group home today. CSW spoke with [name], owner of group home, and patient is able to return. Group home will come pick up the patient and review discharge instructions upon arrival. CSW notified legal guardian, [name], and faxed her discharge summary. No further TOC needs.    Final next level of care: Group Home Barriers to Discharge: Barriers Resolved   Patient Goals and CMS Choice Patient states their goals for this hospitalization and ongoing recovery are:: patient unable to participate in goal setting, has legal guardian CMS Medicare.gov Compare Post Acute Care list provided to:: Legal Guardian Choice offered to / list presented to : [facility] POA / Guardian      Discharge Placement                Patient to be transferred to facility by: Group home staff Name of family member notified: [name], guardian Patient and family notified of of transfer: 09/24/23  Discharge Plan and Services Additional resources added to the After Visit Summary for       Post Acute Care Choice: NA                               Social Drivers of Health (SDOH) Interventions SDOH Screenings   Food Insecurity: No Food Insecurity (09/23/2023)  Housing: Low Risk  (09/23/2023)  Transportation Needs: No Transportation Needs (09/23/2023)  Utilities: Not At Risk (09/23/2023)  Depression (PHQ2-9): Low Risk  (08/04/2023)  Tobacco Use: Medium Risk (09/21/2023)     Readmission Risk Interventions     No data to display

## 2023-09-24 NOTE — Plan of Care (Signed)
  Problem: Education: Goal: Knowledge of General Education information will improve Description: Including pain rating scale, medication(s)/side effects and non-pharmacologic comfort measures Outcome: Progressing   Problem: Clinical Measurements: Goal: Ability to maintain clinical measurements within normal limits will improve Outcome: Progressing   Problem: Activity: Goal: Risk for activity intolerance will decrease Outcome: Progressing   Problem: Nutrition: Goal: Adequate nutrition will be maintained Outcome: Progressing   Problem: Coping: Goal: Level of anxiety will decrease Outcome: Progressing   Problem: Safety: Goal: Ability to remain free from injury will improve Outcome: Progressing

## 2023-09-24 NOTE — [place] FL2 (Signed)
 [place]  MEDICAID FL2 LEVEL OF CARE FORM     IDENTIFICATION  Patient Name: Eddie Berry Birthdate: 11/05/1985 Sex: male Admission Date (Current Location): 09/22/2023  [facility] and [place] Number:  Chiropodist and Address:  [facility] [place], [address] [place], [place] [address]      Provider Number: [id number]  Attending Physician Name and Address:  Feliciana Horn, MD  Relative Name and Phone Number:       Current Level of Care: Hospital Recommended Level of Care: [facility] Prior Approval Number:    Date Approved/Denied:   PASRR Number:    Discharge Plan: Other (Comment) [facility])    Current Diagnoses: Patient Active Problem List   Diagnosis Date Noted   Seizure-like activity (HCC) 09/22/2023   Seizure disorder (HCC) 09/18/2023   Observed seizure-like activity (HCC) 08/22/2023   Obesity (BMI 30.0-34.9) 08/04/2023   Chronic constipation 08/04/2023   Vitamin D  deficiency 08/04/2023   Chronic dryness of both eyes 08/04/2023   Gastroesophageal reflux disease 08/04/2023   Tobacco use 05/15/2022   Hypoglycemia 05/15/2022   Medication management 02/06/2022   Lethargy 02/05/2022   Acute respiratory failure with hypoxia (HCC) 02/04/2022   CAP (community acquired pneumonia) 02/03/2022   Hypothyroidism 02/03/2022   Acute pulmonary embolism (HCC) 02/03/2022   Excessive daytime sleepiness 12/21/2021   Seizure (HCC) 06/16/2020   Mood disorder (HCC) 06/16/2020   TBI (traumatic brain injury) (HCC)     Orientation RESPIRATION BLADDER Height & Weight     Self, Time, Situation, Place  Normal Continent Weight:   Height:     BEHAVIORAL SYMPTOMS/MOOD NEUROLOGICAL BOWEL NUTRITION STATUS    Convulsions/Seizures Continent Diet (regular)  AMBULATORY STATUS COMMUNICATION OF NEEDS Skin   Supervision Verbally Normal                       Personal Care Assistance Level of Assistance  Bathing, Feeding, Dressing Bathing  Assistance: Independent Feeding assistance: Independent Dressing Assistance: Independent     Functional Limitations Info             SPECIAL CARE FACTORS FREQUENCY                       Contractures Contractures Info: Not present    Additional Factors Info  Code Status, Allergies Code Status Info: Full Allergies Info: Antihistamines, Diphenhydramine-type, Nsaids, Prednisone, Aspirin, Penicillins           Current Medications (09/24/2023):  This is the current hospital active medication list Current Facility-Administered Medications  Medication Dose Route Frequency Provider Last Rate Last Admin   acetaminophen  (TYLENOL ) tablet 650 mg  650 mg Oral Q6H PRN Reesa Cannon N, DO       cloZAPine  (CLOZARIL ) tablet 300 mg  300 mg Oral Daily Reesa Cannon N, DO   300 mg at 09/24/23 2952   divalproex  (DEPAKOTE ) DR tablet 500 mg  500 mg Oral BID Reesa Cannon N, DO   500 mg at 09/24/23 0911   enoxaparin  (LOVENOX ) injection 40 mg  40 mg Subcutaneous Daily Bary Boss, DO   40 mg at 09/24/23 8413   hydrOXYzine  (ATARAX ) tablet 25-50 mg  25-50 mg Oral BID PRN Bary Boss, DO       ipratropium-albuterol  (DUONEB) 0.5-2.5 (3) MG/3ML nebulizer solution 3 mL  3 mL Nebulization Q6H PRN Feliciana Horn, MD       lacosamide  (VIMPAT ) tablet 200 mg  200 mg  Oral BID Bary Boss, DO   200 mg at 09/24/23 2440   levothyroxine  (SYNTHROID ) tablet 25 mcg  25 mcg Oral Q0600 Bary Boss, DO   25 mcg at 09/24/23 0507   LORazepam  (ATIVAN ) injection 2 mg  2 mg Intravenous Q6H PRN Reesa Cannon N, DO       melatonin tablet 5 mg  5 mg Oral QHS PRN Hall, Carole N, DO   5 mg at 09/23/23 2140   metoprolol tartrate (LOPRESSOR) injection 5 mg  5 mg Intravenous Q4H PRN Howerter, Justin B, DO   5 mg at 09/23/23 0635   polyethylene glycol (MIRALAX  / GLYCOLAX ) packet 17 g  17 g Oral Daily PRN Bary Boss, DO       prochlorperazine  (COMPAZINE ) injection 5 mg  5 mg Intravenous Q6H PRN Reesa Cannon N, DO        zonisamide  (ZONEGRAN ) capsule 50 mg  50 mg Oral Daily Hall, Carole N, DO   50 mg at 09/24/23 0912     Discharge Medications: Please see discharge summary for a list of discharge medications.  Relevant Imaging Results:  Relevant Lab Results:   Additional Information SS#: [id number]  Tandy Fam, LCSW

## 2023-09-24 NOTE — Procedures (Addendum)
 Patient Name: Eddie Berry  MRN: [id number]  Epilepsy Attending: Arleene Lack  Referring Physician/Provider: Khaliqdina, Salman, MD  Duration: 09/24/2023 0206 to 09/24/2023 1503   Patient history: 38yo M with seizure like activity. EEG to evaluate for seizure.   Level of alertness: Awake, asleep   AEDs during EEG study: VPA, LCM, ZNS   Technical aspects: This EEG study was done with scalp electrodes positioned according to the 10-20 International system of electrode placement. Electrical activity was reviewed with band pass filter of 1-70Hz , sensitivity of 7 uV/mm, display speed of 37mm/sec with a 60Hz  notched filter applied as appropriate. EEG data were recorded continuously and digitally stored.  Video monitoring was available and reviewed as appropriate.   Description: The posterior dominant rhythm consists of 8-9 Hz activity of moderate voltage (25-35 uV) seen predominantly in posterior head regions, symmetric and reactive to eye opening and eye closing.  Sleep was characterized by vertex waves, sleep symptoms (12 to 14 Hz), maximal frontocentral region.  Hyperventilation and photic stimulation were not performed.      IMPRESSION: This study is within normal limits. No seizures or epileptiform discharges were seen throughout the recording.   A normal interictal EEG does not exclude the diagnosis of epilepsy.   Sandralee Tarkington O Diasia Henken

## 2023-09-29 ENCOUNTER — Other Ambulatory Visit: Payer: Self-pay

## 2023-09-29 ENCOUNTER — Emergency Department

## 2023-09-29 ENCOUNTER — Emergency Department
Admission: EM | Admit: 2023-09-29 | Discharge: 2023-09-29 | Disposition: A | Discharge status: Home / Self Care | Attending: Emergency Medicine | Admitting: Emergency Medicine

## 2023-09-29 DIAGNOSIS — F840 Autistic disorder: Secondary | ICD-10-CM | POA: Diagnosis not present

## 2023-09-29 DIAGNOSIS — R569 Unspecified convulsions: Secondary | ICD-10-CM | POA: Diagnosis not present

## 2023-09-29 DIAGNOSIS — R270 Ataxia, unspecified: Secondary | ICD-10-CM | POA: Diagnosis not present

## 2023-09-29 DIAGNOSIS — M5030 Other cervical disc degeneration, unspecified cervical region: Secondary | ICD-10-CM | POA: Diagnosis not present

## 2023-09-29 DIAGNOSIS — S0990XA Unspecified injury of head, initial encounter: Secondary | ICD-10-CM | POA: Diagnosis not present

## 2023-09-29 DIAGNOSIS — R29810 Facial weakness: Secondary | ICD-10-CM | POA: Diagnosis not present

## 2023-09-29 DIAGNOSIS — R0689 Other abnormalities of breathing: Secondary | ICD-10-CM | POA: Diagnosis not present

## 2023-09-29 DIAGNOSIS — S199XXA Unspecified injury of neck, initial encounter: Secondary | ICD-10-CM | POA: Diagnosis not present

## 2023-09-29 LAB — CBC WITH DIFFERENTIAL/PLATELET
Abs Immature Granulocytes: 0.11 10*3/uL — ABNORMAL HIGH (ref 0.00–0.07)
Basophils Absolute: 0.1 10*3/uL (ref 0.0–0.1)
Basophils Relative: 1 %
Eosinophils Absolute: 0.2 10*3/uL (ref 0.0–0.5)
Eosinophils Relative: 3 %
HCT: 42.6 % (ref 39.0–52.0)
Hemoglobin: 14.1 g/dL (ref 13.0–17.0)
Immature Granulocytes: 1 %
Lymphocytes Relative: 35 %
Lymphs Abs: 3 10*3/uL (ref 0.7–4.0)
MCH: 31.4 pg (ref 26.0–34.0)
MCHC: 33.1 g/dL (ref 30.0–36.0)
MCV: 94.9 fL (ref 80.0–100.0)
Monocytes Absolute: 0.8 10*3/uL (ref 0.1–1.0)
Monocytes Relative: 9 %
Neutro Abs: 4.4 10*3/uL (ref 1.7–7.7)
Neutrophils Relative %: 51 %
Platelets: 305 10*3/uL (ref 150–400)
RBC: 4.49 MIL/uL (ref 4.22–5.81)
RDW: 12.9 % (ref 11.5–15.5)
WBC: 8.6 10*3/uL (ref 4.0–10.5)
nRBC: 0 % (ref 0.0–0.2)

## 2023-09-29 LAB — COMPREHENSIVE METABOLIC PANEL WITH GFR
ALT: 31 U/L (ref 0–44)
AST: 26 U/L (ref 15–41)
Albumin: 4 g/dL (ref 3.5–5.0)
Alkaline Phosphatase: 52 U/L (ref 38–126)
Anion gap: 10 (ref 5–15)
BUN: 18 mg/dL (ref 6–20)
CO2: 26 mmol/L (ref 22–32)
Calcium: 8.2 mg/dL — ABNORMAL LOW (ref 8.9–10.3)
Chloride: 103 mmol/L (ref 98–111)
Creatinine, Ser: 0.87 mg/dL (ref 0.61–1.24)
GFR, Estimated: >60 mL/min (ref >60)
Glucose, Bld: 84 mg/dL (ref 70–99)
Potassium: 3.8 mmol/L (ref 3.5–5.1)
Sodium: 139 mmol/L (ref 135–145)
Total Bilirubin: 0.4 mg/dL (ref 0.0–1.2)
Total Protein: 7.2 g/dL (ref 6.5–8.1)

## 2023-09-29 LAB — URINALYSIS, ROUTINE W REFLEX MICROSCOPIC
Bilirubin Urine: NEGATIVE
Glucose, UA: NEGATIVE mg/dL
Hgb urine dipstick: NEGATIVE
Ketones, ur: NEGATIVE mg/dL
Leukocytes,Ua: NEGATIVE
Nitrite: NEGATIVE
Protein, ur: NEGATIVE mg/dL
Specific Gravity, Urine: 1.004 — ABNORMAL LOW (ref 1.005–1.030)
pH: 7 (ref 5.0–8.0)

## 2023-09-29 LAB — VALPROIC ACID LEVEL: Valproic Acid Lvl: 29 ug/mL — ABNORMAL LOW (ref 50–100)

## 2023-09-29 LAB — MAGNESIUM: Magnesium: 2.1 mg/dL (ref 1.7–2.4)

## 2023-09-29 NOTE — ED Notes (Signed)
 Pt guardian called and HIPAA compliant vm left.

## 2023-09-29 NOTE — ED Provider Notes (Signed)
 [facility] Provider Note    Event Date/Time   First MD Initiated Contact with Patient 09/29/23 1616     (approximate)   History   Seizures   HPI  Eddie Berry is a 38 y.o. male  autism, seizure disorder, TBI who comes in with concerns for seizure.  Patient was found unresponsive on the ground.  When EMS got there patient initially had a GCS of 3 but upon transport to the emergency room patient had woken up.  I reviewed a note where patient was seen on 5/16 for a seizure.  He was then seen on 5/18 where patient was transferred to [facility] where he was admitted from 5/19 until 5/21 where there was concern for possible breakthrough seizure like spells versus pseudoseizures exacerbated by his stressful home environment.Neurology recommended to continue all 3 home AEDs, valproic acid  500 mg twice daily, Vimpat  200 mg twice daily and Zonegran  50 mg nightly.   Patient denies any symptoms.  He reports feeling at his baseline self.  He states he does not remember what happened and that he just was on the ground  Physical Exam   Triage Vital Signs: ED Triage Vitals  Encounter Vitals Group     BP 09/29/23 1621 (!) 138/97     Systolic BP Percentile --      Diastolic BP Percentile --      Pulse Rate 09/29/23 1621 97     Resp 09/29/23 1621 (!) 25     Temp 09/29/23 1621 97.9 F (36.6 C)     Temp Source 09/29/23 1621 Oral     SpO2 09/29/23 1621 100 %     Weight 09/29/23 1623 228 lb (103.4 kg)     Height 09/29/23 1623 6' (1.829 m)     Head Circumference --      Peak Flow --      Pain Score 09/29/23 1622 4     Pain Loc --      Pain Education --      Exclude from Growth Chart --     Most recent vital signs: Vitals:   09/29/23 1621  BP: (!) 138/97  Pulse: 97  Resp: (!) 25  Temp: 97.9 F (36.6 C)  SpO2: 100%     General: Awake, no distress.  CV:  Good peripheral perfusion.  Resp:  Normal effort.  Abd:  No distention.  Soft and  nontender Other:  Patient has baseline right-sided facial droop but otherwise cranial nerves appear intact.   ED Results / Procedures / Treatments   Labs (all labs ordered are listed, but only abnormal results are displayed) Labs Reviewed  CBC WITH DIFFERENTIAL/PLATELET - Abnormal; Notable for the following components:      Result Value   Abs Immature Granulocytes 0.11 (*)    All other components within normal limits  URINALYSIS, ROUTINE W REFLEX MICROSCOPIC - Abnormal; Notable for the following components:   Color, Urine STRAW (*)    APPearance CLEAR (*)    Specific Gravity, Urine 1.004 (*)    All other components within normal limits  COMPREHENSIVE METABOLIC PANEL WITH GFR  MAGNESIUM  VALPROIC ACID  LEVEL  CBG MONITORING, ED     EKG  My interpretation of EKG:  Normal sinus rhythm 95 without any ST elevation or T wave inversions, normal intervals  RADIOLOGY I have reviewed the ct personally and interpreted no evidence of intracranial hemorrhage   PROCEDURES:  Critical Care performed: No  .1-3 Lead EKG  Interpretation  Performed by: Lubertha Rush, MD Authorized by: Lubertha Rush, MD     Interpretation: normal     ECG rate:  90   ECG rate assessment: normal     Rhythm: sinus rhythm     Ectopy: none     Conduction: normal      MEDICATIONS ORDERED IN ED: Medications - No data to display   IMPRESSION / MDM / ASSESSMENT AND PLAN / ED COURSE  I reviewed the triage vital signs and the nursing notes.   Patient's presentation is most consistent with acute presentation with potential threat to life or bodily function.   Patient comes in with possible seizure he was unresponsive with EMS but then woke up.  With me patient is acting exactly like his baseline self.  It sounds that it was an abrupt change from being unresponsive to acting normal self and after reading the discharge summary that it sounds that there is some concern for epileptic seizure versus  pseudoseizures.  However, given patient's history we will get CT imaging and blood work to evaluate and monitor patient for recurrent seizures.  This does not sound consistent with syncope.  No evidence of meningitis.  UA without evidence of UTI  Valproic level normal   Mag normal  Cbc normal   Cmp normal   IMPRESSION: 1. No acute fracture or subluxation of the cervical spine. 2. Stable degenerative disc disease. Congenital fusion of C2-C3.    6:01 PM Reevaluated patient.  He has had no additional seizure activity.  He is acting his baseline self.  He states that he would like something to eat.  When he stated that we can get him some crackers he states that he wants something from the cafeteria.  I told him I did not know what they had today he stated that he knows what he would like to order and he asked if he could give me the order.  I explained to patient that I would have the nurse come talk with him but at this time his workup is reassuring and that he could be discharged back to group home.  We can try to get him some food while waiting for the group home to come pick him up.  The patient is on the cardiac monitor to evaluate for evidence of arrhythmia and/or significant heart rate changes.      FINAL CLINICAL IMPRESSION(S) / ED DIAGNOSES   Final diagnoses:  Seizure (HCC)     Rx / DC Orders   ED Discharge Orders     None        Note:  This document was prepared using Dragon voice recognition software and may include unintentional dictation errors.   Lubertha Rush, MD 09/29/23 [phone]

## 2023-09-29 NOTE — ED Notes (Signed)
 Spoke with pt's emergency contact, Cletus Dakins, and they will be coming to get pt asap.

## 2023-09-29 NOTE — Discharge Instructions (Addendum)
 Your CT imaging and your blood work was reassuring.  Make sure that you are continue to take all of your seizure medications as prescribed.  You will want to talk with your neurology team to see if they want to adjust your medications.  Please call them tomorrow to make a follow-up appointment and return to the ER if you develop a return of seizure worsening symptoms or any other concerns

## 2023-09-29 NOTE — ED Triage Notes (Signed)
 Pt arrives via [facility] from his group home with c/o an unwitnessed seizure. Per EMS pt was found on the floor of his bedroom and per staff when this usually happens pt has had a seizure. Pt appears to be back at his baseline with his right sided facial droop. Pt is A&Ox4, ambulatory with no complaints at this time.

## 2023-10-01 ENCOUNTER — Ambulatory Visit (INDEPENDENT_AMBULATORY_CARE_PROVIDER_SITE_OTHER): Admitting: Family Medicine

## 2023-10-01 ENCOUNTER — Encounter: Payer: Self-pay | Admitting: Family Medicine

## 2023-10-01 VITALS — BP 116/84 | HR 100 | Resp 16 | Ht 72.0 in | Wt 231.0 lb

## 2023-10-01 DIAGNOSIS — R350 Frequency of micturition: Secondary | ICD-10-CM | POA: Diagnosis not present

## 2023-10-01 DIAGNOSIS — E559 Vitamin D deficiency, unspecified: Secondary | ICD-10-CM

## 2023-10-01 DIAGNOSIS — R32 Unspecified urinary incontinence: Secondary | ICD-10-CM

## 2023-10-01 DIAGNOSIS — E039 Hypothyroidism, unspecified: Secondary | ICD-10-CM | POA: Diagnosis not present

## 2023-10-01 DIAGNOSIS — R3589 Other polyuria: Secondary | ICD-10-CM

## 2023-10-01 DIAGNOSIS — R569 Unspecified convulsions: Secondary | ICD-10-CM

## 2023-10-01 NOTE — Progress Notes (Signed)
 Established patient visit   Patient: Eddie Berry   DOB: Jan 29, 1986   38 y.o. Male  MRN: [id number] Visit Date: 10/01/2023  Today's healthcare provider: Jeralene Mom, MD   Chief Complaint  Patient presents with   Follow-up    ER visit 5/26 for seizures   Subjective    HPI  Presents for follow up seizure disorder with a recent hospitalization and several ER visits for seizures over the last month. Seizure d/o is managed at [facility]. Neurology. Had neurology app 5/14 and valproic acid  was changed to 250qam and 500qpm. Was admitted 5/14 through 5-16 and increased to 500 BID. Admitted again  on 5/19 and transferred to [facility] where he was discharged on 5/19, apparently on same medication regiment. ER visit after another seizure on 09/29/2023. VPA was slightly therapeutic at 29 (t>50). Caretakers report he is taking medications consistently. Only side effect seems to be increased thirst and frequent urination. They are concerned there may may be another urologic problem and request referral. Would also like prescription for Depends to where over nights.   Last CBC Lab Results  Component Value Date   WBC 8.6 09/29/2023   HGB 14.1 09/29/2023   HCT 42.6 09/29/2023   MCV 94.9 09/29/2023   MCH 31.4 09/29/2023   RDW 12.9 09/29/2023   PLT 305 09/29/2023   Last metabolic panel Lab Results  Component Value Date   GLUCOSE 84 09/29/2023   NA 139 09/29/2023   K 3.8 09/29/2023   CL 103 09/29/2023   CO2 26 09/29/2023   BUN 18 09/29/2023   CREATININE 0.87 09/29/2023   GFRNONAA >60 09/29/2023   CALCIUM 8.2 (L) 09/29/2023   PHOS 3.0 09/23/2023   PROT 7.2 09/29/2023   ALBUMIN 4.0 09/29/2023   BILITOT 0.4 09/29/2023   ALKPHOS 52 09/29/2023   AST 26 09/29/2023   ALT 31 09/29/2023   ANIONGAP 10 09/29/2023   Lab Results  Component Value Date   TSH 3.155 05/24/2022     Medications: Outpatient Medications Prior to Visit  Medication Sig   acetaminophen  (TYLENOL ) 500 MG tablet Take 1  tablet (500 mg total) by mouth every 8 (eight) hours as needed for mild pain (pain score 1-3), moderate pain (pain score 4-6), fever or headache.   albuterol  (VENTOLIN  HFA) 108 (90 Base) MCG/ACT inhaler Inhale 2 puffs into the lungs every 6 (six) hours as needed for wheezing or shortness of breath.   cetirizine (ZYRTEC) 10 MG tablet Take 10 mg by mouth daily.   Cholecalciferol  (VITAMIN D3) 50 MCG (2000 UT) TABS TAKE 1 TABLET BY MOUTH EVERY MORNING FOR VIT DAILY INSUFFICIENCY *TAKE WITH FOOD*   cloZAPine  (CLOZARIL ) 100 MG tablet Take 3 tablets (300 mg total) by mouth daily. Take for total 300 mg once daily   divalproex  (DEPAKOTE ) 500 MG DR tablet Take 1 tablet (500 mg total) by mouth 2 (two) times daily.   docusate sodium  (COLACE) 100 MG capsule Take 1 capsule (100 mg total) by mouth daily.   fenofibrate  (TRICOR ) 48 MG tablet TAKE 1 TABLET BY MOUTH EVERY MORNING *DO NOT CRUSH OR CHEW*   fluticasone  (FLONASE ) 50 MCG/ACT nasal spray Place 2 sprays into both nostrils daily.   hydroxypropyl methylcellulose / hypromellose (ISOPTO TEARS / GONIOVISC) 2.5 % ophthalmic solution Place 2 drops into both eyes 2 (two) times daily.   hydrOXYzine  (ATARAX ) 50 MG tablet Take 25-50 mg by mouth 2 (two) times daily as needed for anxiety.   ibuprofen (ADVIL) 400 MG  tablet Take 1.5 tablets (600 mg total) by mouth every 8 (eight) hours as needed.   ipratropium-albuterol  (DUONEB) 0.5-2.5 (3) MG/3ML SOLN Take 3 mLs by nebulization every 6 (six) hours as needed.   lacosamide  (VIMPAT ) 200 MG TABS tablet Take 1 tablet (200 mg total) by mouth 2 (two) times daily.   levothyroxine  (SYNTHROID ) 25 MCG tablet TAKE 1 TABLET BY MOUTH ONCE DAILY *TAKE ON AN EMPTY STOMACH*   Melatonin 10 MG TABS Take 10 mg by mouth at bedtime as needed (Sleep).   Multiple Vitamin (THEREMS PO) Take 1 tablet by mouth daily.   Multiple Vitamin (THEREMS) TABS TAKE 1 TABLET BY MOUTH EVERY MORNING FOR NUTRITIONAL SUPPLEMENT   MYRBETRIQ  50 MG TB24 tablet TAKE  1 TABLET BY MOUTH EVERY MORNING FOR OVERACTIVE BLADDER *DO NOT CRUSH OR CHEW*   omeprazole (PRILOSEC) 20 MG capsule Take 20 mg by mouth daily.   paliperidone  (INVEGA ) 6 MG 24 hr tablet Take 12 mg by mouth every morning.   propranolol  (INDERAL ) 10 MG tablet Take 10 mg by mouth daily.   sertraline  (ZOLOFT ) 100 MG tablet Take 150 mg by mouth at bedtime.   zonisamide  (ZONEGRAN ) 50 MG capsule Take 1 capsule (50 mg total) by mouth daily.   No facility-administered medications prior to visit.    Review of Systems  Constitutional:  Negative for appetite change, chills and fever.  Respiratory:  Negative for chest tightness, shortness of breath and wheezing.   Cardiovascular:  Negative for chest pain and palpitations.  Gastrointestinal:  Negative for abdominal pain, nausea and vomiting.       Objective    BP 116/84 (BP Location: Right Arm, Patient Position: Sitting, Cuff Size: Normal)   Pulse 100   Resp 16   Ht 6' (1.829 m)   Wt 231 lb (104.8 kg)   SpO2 96%   BMI 31.33 kg/m    Physical Exam   General: Appearance:    Well developed, well nourished male in no acute distress  Eyes:    PERRL, conjunctiva/corneas clear, EOM's intact       Lungs:     Clear to auscultation bilaterally, respirations unlabored  Heart:    Tachycardic. Normal rhythm. No murmurs, rubs, or gallops.    MS:   All extremities are intact.    Neurologic:   Awake, alert, oriented x 3. No apparent focal neurological defect.         Assessment & Plan     1. Observed seizure-like activity (HCC) (Primary) Subtherapeutic VPA when at ER 2 days ago. Caregivers reports pt getting medication consistently.  - Valproic Acid  level  Do not see neurology appointment scheduled. Will double check, but per last notes is supposed to have follow up mid to late June.   2. Hypothyroidism, unspecified type Due to check labs - TSH - T4, free  3. Vitamin D  deficiency  - VITAMIN D  25 Hydroxy (Vit-D Deficiency, Fractures)  4.  Frequency of urination and polyuria  - Ambulatory referral to Urology  5. Urinary incontinence, unspecified type  - Ambulatory referral to Urology  Rx for depends undergarments.        Jeralene Mom, MD  [facility] [phone] (phone) [phone] (fax)  [facility]

## 2023-10-02 ENCOUNTER — Ambulatory Visit: Payer: Self-pay | Admitting: Family Medicine

## 2023-10-02 ENCOUNTER — Telehealth: Payer: Self-pay

## 2023-10-02 DIAGNOSIS — G40909 Epilepsy, unspecified, not intractable, without status epilepticus: Secondary | ICD-10-CM

## 2023-10-02 LAB — VALPROIC ACID LEVEL: Valproic Acid Lvl: 26 ug/mL — ABNORMAL LOW (ref 50–100)

## 2023-10-02 LAB — VITAMIN D 25 HYDROXY (VIT D DEFICIENCY, FRACTURES): Vit D, 25-Hydroxy: 39.3 ng/mL (ref 30.0–100.0)

## 2023-10-02 LAB — T4, FREE: Free T4: 1.11 ng/dL (ref 0.82–1.77)

## 2023-10-02 LAB — TSH: TSH: 3.5 u[IU]/mL (ref 0.450–4.500)

## 2023-10-02 MED ORDER — DIVALPROEX SODIUM 250 MG PO DR TAB: DELAYED_RELEASE_TABLET | ORAL | 2 refills | Status: AC

## 2023-10-02 NOTE — Progress Notes (Signed)
 Spoke with Royston Cornea (caregiver) and Theotis Flake Civil engineer, contracting). Both made aware of results and changes to patient medication, they are requesting more medication to be sent in to pharmacy, as a new order. Rosa and Royston Cornea will be taking what they have now to pharmacy so they can make the changes of how much patient should take.

## 2023-10-02 NOTE — Telephone Encounter (Signed)
 Spoke with Eddie Berry made aware and verbalize understanding. Stated he would call today to have a f/u appt with his neurologist.

## 2023-10-02 NOTE — Telephone Encounter (Signed)
 Spoke with pharmacist and they have questions about the dose of the Depakote .  Patient has been seen several times in the ER and there are multiple active prescriptions for different doses and directions of Depakote  currently.  Need to clarify with group home what dose and directions patient is going by for the Depakote , and then confirm with Dr Shann Darnel what he wants him to be on and then let pharmacy know.     Copied from CRM [id number]. Topic: Clinical - Medication Question >> Oct 02, 2023 12:27 PM [place] wrote: Reason for CRM: Eddie Berry with [facility], need clarification on script for divalproex  (DEPAKOTE ) 250 MG DR tablet- [phone]

## 2023-10-02 NOTE — Telephone Encounter (Signed)
 Spoke to [place] and he states that patient was on 500 mg BID. Neurologist changed him to 250 mg am and 500 mg pm. ER increased him to 500 mg BID but the pharmacy never got that change and still had him on 250 and 500 pm.  Pharmacy wants to confirm if you want him on 250 and 750 or do you want to increase both doses at once and make it 500 and 750   CRM message from pharmacy:  Spoke with pharmacist and they have questions about the dose of the Depakote .  Patient has been seen several times in the ER and there are multiple active prescriptions for different doses and directions of Depakote  currently.  Need to clarify with group home what dose and directions patient is going by for the Depakote , and then confirm with Dr Shann Darnel what he wants him to be on and then let pharmacy know.      Copied from CRM [id number]. Topic: Clinical - Medication Question >> Oct 02, 2023 12:27 PM [place] wrote: Reason for CRM: Tanis Fan with [facility], need clarification on script for divalproex  (DEPAKOTE ) 250 MG DR tablet- [phone]      Note

## 2023-10-02 NOTE — Telephone Encounter (Signed)
 Spoke with Tanis Fan from [facility], she took verbal order as how to Dr. Shann Darnel would like pt to take new dosage.

## 2023-10-02 NOTE — Telephone Encounter (Addendum)
 I'm totally confused. His caregiver told me on 5/28 that the patient was taking 500mg  twice a day. I want to add an additional 250mg  a day to what he was taking as of 5/28. If the caregiver told me the correct dose at that time, then the patient needs to take 500 in the morning and 750 at night.   If they can't figure it out then they just need to contact his neurologist who usually manages his seizure medicatoins.

## 2023-10-02 NOTE — Telephone Encounter (Signed)
 Royston Cornea returned call, stated patient was taken medication as you are recommending now. But patient saw his Neurologist and they reduced and changed he's medications as taking currently. Royston Cornea would like to know if patient should continue with new plan of care on medication recommended or remain to how he is currently taking. Please advise.

## 2023-10-02 NOTE — Telephone Encounter (Signed)
 Yes, he needs to change to 500 in the morning, and 750 in the evening (500+250) because his Depakote  level has been consistently low and having seizures the last couple of weeks. Have sent prescription for the extra 250mg  tablets  Also, I don't see any upcoming neurology appointments. He needs to make sure the patient has follow up appointment with his neurologist within the next 4 weeks.

## 2023-10-06 DIAGNOSIS — S069X9S Unspecified intracranial injury with loss of consciousness of unspecified duration, sequela: Secondary | ICD-10-CM | POA: Diagnosis not present

## 2023-10-12 ENCOUNTER — Emergency Department: Admission: EM | Admit: 2023-10-12 | Discharge: 2023-10-12 | Disposition: A | Discharge status: Home / Self Care

## 2023-10-12 DIAGNOSIS — J45909 Unspecified asthma, uncomplicated: Secondary | ICD-10-CM | POA: Insufficient documentation

## 2023-10-12 DIAGNOSIS — Z743 Need for continuous supervision: Secondary | ICD-10-CM | POA: Diagnosis not present

## 2023-10-12 DIAGNOSIS — R569 Unspecified convulsions: Secondary | ICD-10-CM | POA: Insufficient documentation

## 2023-10-12 DIAGNOSIS — G4489 Other headache syndrome: Secondary | ICD-10-CM | POA: Diagnosis not present

## 2023-10-12 DIAGNOSIS — R6889 Other general symptoms and signs: Secondary | ICD-10-CM | POA: Diagnosis not present

## 2023-10-12 DIAGNOSIS — R000 Tachycardia, unspecified: Secondary | ICD-10-CM | POA: Diagnosis not present

## 2023-10-12 LAB — BASIC METABOLIC PANEL WITH GFR
Anion gap: 11 (ref 5–15)
BUN: 12 mg/dL (ref 6–20)
CO2: 24 mmol/L (ref 22–32)
Calcium: 9.3 mg/dL (ref 8.9–10.3)
Chloride: 103 mmol/L (ref 98–111)
Creatinine, Ser: 0.9 mg/dL (ref 0.61–1.24)
GFR, Estimated: >60 mL/min (ref >60)
Glucose, Bld: 99 mg/dL (ref 70–99)
Potassium: 3.8 mmol/L (ref 3.5–5.1)
Sodium: 138 mmol/L (ref 135–145)

## 2023-10-12 LAB — CBC WITH DIFFERENTIAL/PLATELET
Abs Immature Granulocytes: 0.01 10*3/uL (ref 0.00–0.07)
Basophils Absolute: 0 10*3/uL (ref 0.0–0.1)
Basophils Relative: 1 %
Eosinophils Absolute: 0.2 10*3/uL (ref 0.0–0.5)
Eosinophils Relative: 4 %
HCT: 40.1 % (ref 39.0–52.0)
Hemoglobin: 13.1 g/dL (ref 13.0–17.0)
Immature Granulocytes: 0 %
Lymphocytes Relative: 39 %
Lymphs Abs: 2.1 10*3/uL (ref 0.7–4.0)
MCH: 30.3 pg (ref 26.0–34.0)
MCHC: 32.7 g/dL (ref 30.0–36.0)
MCV: 92.6 fL (ref 80.0–100.0)
Monocytes Absolute: 0.7 10*3/uL (ref 0.1–1.0)
Monocytes Relative: 12 %
Neutro Abs: 2.4 10*3/uL (ref 1.7–7.7)
Neutrophils Relative %: 44 %
Platelets: 261 10*3/uL (ref 150–400)
RBC: 4.33 MIL/uL (ref 4.22–5.81)
RDW: 12.8 % (ref 11.5–15.5)
WBC: 5.4 10*3/uL (ref 4.0–10.5)
nRBC: 0 % (ref 0.0–0.2)

## 2023-10-12 LAB — VALPROIC ACID LEVEL: Valproic Acid Lvl: 39 ug/mL — ABNORMAL LOW (ref 50–100)

## 2023-10-12 NOTE — Discharge Instructions (Addendum)
 Your evaluation in the emergency department was reassuring.  Continue to take your seizure medications as already prescribed and follow-up with your neurologist (Dr. Walden Guise) tomorrow as already planned.  Return to the emergency department with any new or worsening symptoms.

## 2023-10-12 NOTE — ED Provider Notes (Signed)
 [facility] Provider Note    Event Date/Time   First MD Initiated Contact with Patient 10/12/23 1302     (approximate)   History   Seizures  Pt presents to the ED via [facility] from group home [address] in [place]. EMT-P was unable to inform this RN of name of group home. PT has been seen multiple times recently for break-through absent seizures. Pt has a hx of same and takes medications. Pt was started on depakote  2.5 weeks ago. Pt A&Ox4.  BP 137/98 HR 102 RR 16 98% RA.   HPI Eddie Berry is a 38 y.o. male PMH seizure disorder, prior TBI, autism, schizophrenia, asthma presents for evaluation of seizure-like activity - Per EMS, group home called due to patient having an off sounds like seizure staring off distance.  Reportedly took longer than usual to return to baseline mental state so called an ambulance. - Has no complaints at time of my eval.  Does note that he believes he had a seizure.   Per chart review, patient was last admitted 5/19-5/21/2025 for seizure-like activity.   Neurology recommended continuing all 3 home antiepileptics (valproic acid , Vimpat , Zonegran ), transferred to [facility] for continuous EEG.  Continuous EEG normal.  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  Noted to have baseline right facial droop involving upper and lower quadrants (chronic)  Subsequently seen by family medicine provider on 10/01/2023.  Noted to have subtherapeutic valproic acid  level on initial presentation to ED.     Physical Exam   Triage Vital Signs: ED Triage Vitals  Encounter Vitals Group     BP 10/12/23 1300 114/79     Systolic BP Percentile --      Diastolic BP Percentile --      Pulse Rate 10/12/23 1300 (!) 101     Resp 10/12/23 1300 18     Temp 10/12/23 1300 98.6 F (37 C)     Temp Source 10/12/23 1300 Oral     SpO2 10/12/23 1300 99 %     Weight 10/12/23 1301 229 lb 4.5 oz  (104 kg)     Height 10/12/23 1301 6' (1.829 m)     Head Circumference --      Peak Flow --      Pain Score 10/12/23 1301 0     Pain Loc --      Pain Education --      Exclude from Growth Chart --     Most recent vital signs: Vitals:   10/12/23 1430 10/12/23 1500  BP: 118/82 (!) 126/92  Pulse: 98 99  Resp: (!) 22 (!) 22  Temp:    SpO2: 97% 97%     General: Awake, no distress.  HEENT:  Normocephalic, atraumatic, well-healed scar on left scalp CV:  Good peripheral perfusion. RRR, RP 2+ Resp:  Normal effort. CTAB Abd:  No distention. Nontender to deep palpation throughout Neuro:  Aox4, CN II-XII intact but +R facial droop present, FNF wnl, finger taps fast b/l, 5/5 strength in bilateral finger extension/grip,  EHL/FHL. BUE AG 10+ sec no drift, BLE AG 5+ sec no drift. SILT.     ED Results / Procedures / Treatments   Labs (all labs ordered are listed, but only abnormal results are displayed) Labs Reviewed  VALPROIC ACID  LEVEL - Abnormal; Notable for the following components:      Result Value   Valproic Acid  Lvl 39 (*)    All  other components within normal limits  CBC WITH DIFFERENTIAL/PLATELET  BASIC METABOLIC PANEL WITH GFR     EKG  ED course below.   RADIOLOGY N/a    PROCEDURES:  Critical Care performed: No  Procedures   MEDICATIONS ORDERED IN ED: Medications - No data to display   IMPRESSION / MDM / ASSESSMENT AND PLAN / ED COURSE  I reviewed the triage vital signs and the nursing notes.                              DDX/MDM/AP: Differential diagnosis includes, but is not limited to, pseudoseizure versus breakthrough seizure (thought to be having breakthrough seizures given extensive neurologic workup including continuous EEG and neurology eval and recent hospitalization).  Consider underlying electrolyte abnormality.  Do not suspect acute intracranial pathology at this time, last screening CT head on 09/29/2023.  Fortunately appears to have returned  to baseline mental status by time of my eval.  Plan: - Basic screening labs - brief monitoring - re-eval  Patient's presentation is most consistent with acute complicated illness / injury requiring diagnostic workup.  ED course below.  Workup unremarkable beyond mildly low valproic acid  level, has neurology follow-up tomorrow--will plan to continue home AED regimen for now and can discuss with his primary neurologist tomorrow.  No further seizure-like activity here.  No significant electrolyte abnormalities.  Remains at baseline mental state.  Discharged home.  ED return precautions in place.  Patient agrees with plan.  Clinical Course as of 10/12/23 1608  Sun Oct 12, 2023  1337 CBC, BMP unremarkable Valproic acid  remains subtherapeutic though improved from prior, appears to be increasing with current dosing regimen [MM]  1338 Ecg = sinus tach, rate 106, no ST elevation or depression, no significant repolarization normality, normal axis, normal intervals.  No evidence of ischemia nor arrhythmia on my interpretation. [MM]  1535 Patient reevaluated, remains well-appearing here with no recurrent seizure-like activity.  Per chart review, has an outpatient neurology appointment tomorrow-will continue his routine antiepileptic medications for now and continue with outpatient neuro eval tomorrow.  ED return precautions in place.  Patient agrees with plan. [MM]    Clinical Course User Index [MM] Collis Deaner, MD     FINAL CLINICAL IMPRESSION(S) / ED DIAGNOSES   Final diagnoses:  Seizure-like activity (HCC)     Rx / DC Orders   ED Discharge Orders     None        Note:  This document was prepared using Dragon voice recognition software and may include unintentional dictation errors.   Collis Deaner, MD 10/12/23 [phone]

## 2023-10-12 NOTE — ED Notes (Addendum)
 This RN made x3 attempts to call legal guardian "[name]" without an answer. Spoke with Cletus Dakins regarding patient discharge. States that someone is on the way to pick patient up.

## 2023-10-12 NOTE — ED Triage Notes (Signed)
 Pt presents to the ED via [facility] from group home [address] in [place]. EMT-P was unable to inform this RN of name of group home. PT has been seen multiple times recently for break-through absent seizures. Pt has a hx of same and takes medications. Pt was started on depakote  2.5 weeks ago. Pt A&Ox4.  BP 137/98 HR 102 RR 16 98% RA.

## 2023-10-12 NOTE — ED Notes (Signed)
 Pt verbalizes understanding of discharge instructions. Opportunity for questioning and answers were provided. Pt discharged from ED to home with group home staff.

## 2023-10-13 DIAGNOSIS — R631 Polydipsia: Secondary | ICD-10-CM | POA: Diagnosis not present

## 2023-10-13 DIAGNOSIS — R413 Other amnesia: Secondary | ICD-10-CM | POA: Diagnosis not present

## 2023-10-13 DIAGNOSIS — E538 Deficiency of other specified B group vitamins: Secondary | ICD-10-CM | POA: Diagnosis not present

## 2023-10-13 DIAGNOSIS — E559 Vitamin D deficiency, unspecified: Secondary | ICD-10-CM | POA: Diagnosis not present

## 2023-10-13 DIAGNOSIS — R5383 Other fatigue: Secondary | ICD-10-CM | POA: Diagnosis not present

## 2023-10-13 DIAGNOSIS — Z8673 Personal history of transient ischemic attack (TIA), and cerebral infarction without residual deficits: Secondary | ICD-10-CM | POA: Diagnosis not present

## 2023-10-13 DIAGNOSIS — Z87820 Personal history of traumatic brain injury: Secondary | ICD-10-CM | POA: Diagnosis not present

## 2023-10-13 DIAGNOSIS — R2689 Other abnormalities of gait and mobility: Secondary | ICD-10-CM | POA: Diagnosis not present

## 2023-10-13 DIAGNOSIS — S069X0D Unspecified intracranial injury without loss of consciousness, subsequent encounter: Secondary | ICD-10-CM | POA: Diagnosis not present

## 2023-10-13 DIAGNOSIS — R569 Unspecified convulsions: Secondary | ICD-10-CM | POA: Diagnosis not present

## 2023-10-28 ENCOUNTER — Emergency Department
Admission: EM | Admit: 2023-10-28 | Discharge: 2023-10-28 | Disposition: A | Discharge status: Home / Self Care | Attending: Emergency Medicine | Admitting: Emergency Medicine

## 2023-10-28 ENCOUNTER — Emergency Department

## 2023-10-28 ENCOUNTER — Other Ambulatory Visit: Payer: Self-pay

## 2023-10-28 DIAGNOSIS — Z743 Need for continuous supervision: Secondary | ICD-10-CM | POA: Diagnosis not present

## 2023-10-28 DIAGNOSIS — R9431 Abnormal electrocardiogram [ECG] [EKG]: Secondary | ICD-10-CM | POA: Diagnosis not present

## 2023-10-28 DIAGNOSIS — E039 Hypothyroidism, unspecified: Secondary | ICD-10-CM | POA: Insufficient documentation

## 2023-10-28 DIAGNOSIS — R569 Unspecified convulsions: Secondary | ICD-10-CM | POA: Diagnosis not present

## 2023-10-28 DIAGNOSIS — G9389 Other specified disorders of brain: Secondary | ICD-10-CM | POA: Diagnosis not present

## 2023-10-28 DIAGNOSIS — R404 Transient alteration of awareness: Secondary | ICD-10-CM | POA: Diagnosis not present

## 2023-10-28 DIAGNOSIS — S199XXA Unspecified injury of neck, initial encounter: Secondary | ICD-10-CM | POA: Diagnosis not present

## 2023-10-28 DIAGNOSIS — F840 Autistic disorder: Secondary | ICD-10-CM | POA: Insufficient documentation

## 2023-10-28 DIAGNOSIS — S0990XA Unspecified injury of head, initial encounter: Secondary | ICD-10-CM | POA: Diagnosis not present

## 2023-10-28 LAB — BASIC METABOLIC PANEL WITH GFR
Anion gap: 9 (ref 5–15)
BUN: 16 mg/dL (ref 6–20)
CO2: 26 mmol/L (ref 22–32)
Calcium: 9.2 mg/dL (ref 8.9–10.3)
Chloride: 104 mmol/L (ref 98–111)
Creatinine, Ser: 1.04 mg/dL (ref 0.61–1.24)
GFR, Estimated: >60 mL/min (ref >60)
Glucose, Bld: 90 mg/dL (ref 70–99)
Potassium: 3.9 mmol/L (ref 3.5–5.1)
Sodium: 139 mmol/L (ref 135–145)

## 2023-10-28 LAB — CK: Total CK: 104 U/L (ref 49–397)

## 2023-10-28 LAB — CBC
HCT: 42.8 % (ref 39.0–52.0)
Hemoglobin: 14.1 g/dL (ref 13.0–17.0)
MCH: 31 pg (ref 26.0–34.0)
MCHC: 32.9 g/dL (ref 30.0–36.0)
MCV: 94.1 fL (ref 80.0–100.0)
Platelets: 314 10*3/uL (ref 150–400)
RBC: 4.55 MIL/uL (ref 4.22–5.81)
RDW: 12.9 % (ref 11.5–15.5)
WBC: 10.6 10*3/uL — ABNORMAL HIGH (ref 4.0–10.5)
nRBC: 0 % (ref 0.0–0.2)

## 2023-10-28 LAB — LACTIC ACID, PLASMA: Lactic Acid, Venous: 0.8 mmol/L (ref 0.5–1.9)

## 2023-10-28 LAB — VALPROIC ACID LEVEL: Valproic Acid Lvl: 42 ug/mL — ABNORMAL LOW (ref 50–100)

## 2023-10-28 LAB — CBG MONITORING, ED
Glucose-Capillary: 82 mg/dL (ref 70–99)
Glucose-Capillary: 89 mg/dL (ref 70–99)

## 2023-10-28 MED ORDER — DIAZEPAM 5 MG/ML IJ SOLN
2.5000 mg | Freq: Once | INTRAMUSCULAR | Status: AC
Start: 2023-10-28 — End: 2023-10-28

## 2023-10-28 MED ORDER — DIAZEPAM 5 MG/ML IJ SOLN
INTRAMUSCULAR | Status: AC
  Administered 2023-10-28: 2.5 mg via INTRAVENOUS
  Filled 2023-10-28: qty 2

## 2023-10-28 MED ORDER — AMMONIA AROMATIC IN INHA
RESPIRATORY_TRACT | Status: AC
  Filled 2023-10-28: qty 10

## 2023-10-28 NOTE — ED Notes (Signed)
 Michaelene Admire, NP at bedside for evaluation.

## 2023-10-28 NOTE — ED Notes (Signed)
 Caregiver at the nurses station states that pt's incident that happened earlier is behavioral and one of the reasons he came in today, states that after the seizure like activity and nursing staff left out of the room, caregiver states that the pt picked his head up and looked around before noticing that the caregiver was sitting behind him. Caregiver states that the patient told him that I hold back my seizures until I can't hold them anymore.

## 2023-10-28 NOTE — ED Notes (Addendum)
 Caregiver at the nurses station, stating that pt just fell out into the floor. This RN at bedside and found pt on the floor on his back. Caregiver said he was up went to the bathroom, laid back down and then just rolled out of the stretcher. This RN, Jon RN, Felicia NT, Bradler EDP, and CariBeth NP at bedside to assist pt back into bed. Pt has hx of absent seizure. Pt was minimally responsive at this time. VS, CBG, and EKG obtained at this time. C-Collar placed on pt. Seizure precaution put in place.

## 2023-10-28 NOTE — ED Provider Notes (Signed)
 [facility] Provider Note    Event Date/Time   First MD Initiated Contact with Patient 10/28/23 1215     (approximate)   History   seizure like activity   HPI  Eddie Berry is a 38 y.o. male with history of traumatic brain injury, seizures, schizophrenia, autism mood disorder, hypothyroidism, PE, and as listed in EMR presents to the emergency department for evaluation after seizure-like activity. Patient was asked to go outside and police the grounds. He was walking away, leaned against the wall, and went limp. Caregiver was able to help him to the ground and had him sitting against the wall. Patient slid sideways and laid on the floor.  Caregiver observed that he moved his head back-and-forth a couple times and then shook his left foot.  His eyes were slightly open at that time.  He was then able to follow commands.      Physical Exam   Triage Vital Signs: ED Triage Vitals  Encounter Vitals Group     BP 10/28/23 1133 121/85     Girls Systolic BP Percentile --      Girls Diastolic BP Percentile --      Boys Systolic BP Percentile --      Boys Diastolic BP Percentile --      Pulse Rate 10/28/23 1133 94     Resp 10/28/23 1133 20     Temp 10/28/23 1133 98.3 F (36.8 C)     Temp Source 10/28/23 1133 Oral     SpO2 10/28/23 1133 97 %     Weight 10/28/23 1130 229 lb 4.5 oz (104 kg)     Height 10/28/23 1130 6' (1.829 m)     Head Circumference --      Peak Flow --      Pain Score --      Pain Loc --      Pain Education --      Exclude from Growth Chart --     Most recent vital signs: Vitals:   10/28/23 1245 10/28/23 1330  BP: 114/84 112/85  Pulse: 89 87  Resp: 18 20  Temp:    SpO2: 94% 97%    General: Awake, no distress.  CV:  Good peripheral perfusion.  Resp:  Normal effort.  Abd:  No distention.  Other:  No hematoma or erythematous area noted on head.   ED Results / Procedures / Treatments   Labs (all labs ordered are listed,  but only abnormal results are displayed) Labs Reviewed  CBC - Abnormal; Notable for the following components:      Result Value   WBC 10.6 (*)    All other components within normal limits  VALPROIC ACID  LEVEL - Abnormal; Notable for the following components:   Valproic Acid  Lvl 42 (*)    All other components within normal limits  BASIC METABOLIC PANEL WITH GFR  LACTIC ACID, PLASMA  CK  CBG MONITORING, ED  CBG MONITORING, ED     EKG  Normal sinus rhythm with a rate of 87.   RADIOLOGY  Image and radiology report reviewed and interpreted by me. Radiology report consistent with the same.  Not indicated  PROCEDURES:  Critical Care performed: No  Procedures   MEDICATIONS ORDERED IN ED:  Medications  diazepam (VALIUM) injection 2.5 mg (2.5 mg Intravenous Given 10/28/23 1257)     IMPRESSION / MDM / ASSESSMENT AND PLAN / ED COURSE   I have reviewed the triage note.  Differential  diagnosis includes, but is not limited to, mood disorder, seizure, subtherapeutic valproic acid  level.  Patient's presentation is most consistent with acute illness / injury with system symptoms.  38 year old male presenting to the emergency department after possible seizure-like activity.  See HPI for further details.  He is hemodynamically stable.  He has been evaluated here several times over the past couple of months with similar symptoms.  Per chart review he was last here in the ER on 10/12/2023.  Valproic acid  level was subtherapeutic at that time and the dosage was changed.  He was admitted on Sep 22, 2023 for seizure-like activity and was transferred to [facility] for continuous EEG.  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  He subsequently followed up with neurology on October 13, 2023 and during that exam his Depakote  was increased to 500mg  in the morning and 500mg  nightly.  Today, the patient  is alert and tells me that he wants to stay here for a couple of days.  Assessment is unchanged from previously documented chronic right facial droop.  Labs drawn in triage show a slightly subtherapeutic level of 42.  Caregiver states that he is given his medications daily as prescribed and believes that he takes them instead of stashing them.  Clinical Course as of 10/28/23 1503  Tue Oct 28, 2023  1300 Caregiver reports that patient urinated in the bedside commode, laid back down on the bed, then rolled out of the bed.  No seizure-like activity observed.  Staff were in the room within seconds.  On my exam, nystagmus like movement was noted.  Patient was not responding to verbal commands but heart rate remained normal and respiratory pattern was even.  C-collar was applied then patient was transferred onto the bed with assistance of multiple people.  IV inserted.  Blood glucose and EKG obtained.  In the event of absent seizure, low-dose of Valium was ordered and given by RN.  He is now currently on the cardiac monitor.  CK and lactic acid level added.  CT head and cervical spine imaging ordered as well due to fall from height of the stretcher onto the floor.  He has no obvious deformities of extremities. [CT]  1317 Now responding appropriately to verbal commands. [CT]  1459 Patient is back to baseline.  Lactic acid and CK are both normal.  He is eating, drinking, and conversing.  Plan will be to discharge him home and instructed to follow-up with either the primary care provider or neurology.  They were advised to ensure that he is taking 500 mg of Depakote  twice per day per the last neurology note earlier this month.  Caregiver aware and instructed to return with him to the emergency department for any concerns if unable to see primary care or the specialist [CT]    Clinical Course User Index [CT] Anis Cinelli B, FNP     FINAL CLINICAL IMPRESSION(S) / ED DIAGNOSES   Final diagnoses:   Seizure-like activity (HCC)     Rx / DC Orders   ED Discharge Orders     None        Note:  This document was prepared using Dragon voice recognition software and may include unintentional dictation errors.   Herlinda Kirk NOVAK, FNP 10/28/23 1505    Bradler, Evan K, MD 10/29/23 [phone]

## 2023-10-28 NOTE — ED Notes (Signed)
C-Collar removed by this RN

## 2023-10-28 NOTE — ED Notes (Signed)
Transported to CT at this time. 

## 2023-10-28 NOTE — ED Notes (Signed)
 Attempted to call legal guardian in the chart, unsuccessful at this time.

## 2023-10-28 NOTE — ED Triage Notes (Signed)
 Pt to ED [facility] from [facility] for seizure like activity while having a behavioral outburst. Pt was asked to do something outside, pt laid down on floor and wouldn't do it. He acted like was having a seizure but had purposeful movement. Does have hx seizures and TBI.   EMS: 113/80, HR 91, 94% RA  Pt is alert, oriented. CBG 82.

## 2023-10-28 NOTE — Discharge Instructions (Signed)
 Please make sure that he is receiving 500 mg of Depakote  2 times per day per his neurologist note.  Call for a follow-up appointment with primary care or neurology.   Return with him to the emergency department for symptoms of concern if unable to see primary care or the specialist.

## 2023-10-28 NOTE — ED Provider Notes (Signed)
 Emergency department EKG interpretation  EKG ED ECG REPORT I, Artist MARLA Kerns, the attending physician, personally viewed and interpreted this ECG. Date: 10/28/2023 EKG Time: 1251 Rate: 87 Rhythm: normal sinus rhythm QRS Axis: normal Intervals: normal ST/T Wave abnormalities: normal Narrative Interpretation: no evidence of acute ischemia   Kerns Artist MARLA, MD 10/28/23 1443

## 2023-10-29 ENCOUNTER — Encounter: Payer: Self-pay | Admitting: Emergency Medicine

## 2023-10-29 ENCOUNTER — Emergency Department
Admission: EM | Admit: 2023-10-29 | Discharge: 2023-10-29 | Disposition: A | Discharge status: Home / Self Care | Attending: Emergency Medicine | Admitting: Emergency Medicine

## 2023-10-29 ENCOUNTER — Emergency Department

## 2023-10-29 ENCOUNTER — Other Ambulatory Visit: Payer: Self-pay

## 2023-10-29 DIAGNOSIS — F840 Autistic disorder: Secondary | ICD-10-CM | POA: Insufficient documentation

## 2023-10-29 DIAGNOSIS — S0990XA Unspecified injury of head, initial encounter: Secondary | ICD-10-CM | POA: Diagnosis not present

## 2023-10-29 DIAGNOSIS — E039 Hypothyroidism, unspecified: Secondary | ICD-10-CM | POA: Insufficient documentation

## 2023-10-29 DIAGNOSIS — S199XXA Unspecified injury of neck, initial encounter: Secondary | ICD-10-CM | POA: Diagnosis not present

## 2023-10-29 DIAGNOSIS — Z720 Tobacco use: Secondary | ICD-10-CM | POA: Insufficient documentation

## 2023-10-29 DIAGNOSIS — G9389 Other specified disorders of brain: Secondary | ICD-10-CM | POA: Diagnosis not present

## 2023-10-29 DIAGNOSIS — W01198A Fall on same level from slipping, tripping and stumbling with subsequent striking against other object, initial encounter: Secondary | ICD-10-CM | POA: Insufficient documentation

## 2023-10-29 DIAGNOSIS — R569 Unspecified convulsions: Secondary | ICD-10-CM | POA: Diagnosis not present

## 2023-10-29 DIAGNOSIS — Z743 Need for continuous supervision: Secondary | ICD-10-CM | POA: Diagnosis not present

## 2023-10-29 DIAGNOSIS — M4802 Spinal stenosis, cervical region: Secondary | ICD-10-CM | POA: Diagnosis not present

## 2023-10-29 DIAGNOSIS — R531 Weakness: Secondary | ICD-10-CM | POA: Diagnosis not present

## 2023-10-29 DIAGNOSIS — R55 Syncope and collapse: Secondary | ICD-10-CM | POA: Diagnosis not present

## 2023-10-29 DIAGNOSIS — R404 Transient alteration of awareness: Secondary | ICD-10-CM | POA: Diagnosis not present

## 2023-10-29 LAB — COMPREHENSIVE METABOLIC PANEL WITH GFR
ALT: 15 U/L (ref 0–44)
AST: 20 U/L (ref 15–41)
Albumin: 4.2 g/dL (ref 3.5–5.0)
Alkaline Phosphatase: 46 U/L (ref 38–126)
Anion gap: 10 (ref 5–15)
BUN: 12 mg/dL (ref 6–20)
CO2: 23 mmol/L (ref 22–32)
Calcium: 9.3 mg/dL (ref 8.9–10.3)
Chloride: 106 mmol/L (ref 98–111)
Creatinine, Ser: 0.83 mg/dL (ref 0.61–1.24)
GFR, Estimated: >60 mL/min (ref >60)
Glucose, Bld: 131 mg/dL — ABNORMAL HIGH (ref 70–99)
Potassium: 3.6 mmol/L (ref 3.5–5.1)
Sodium: 139 mmol/L (ref 135–145)
Total Bilirubin: 0.5 mg/dL (ref 0.0–1.2)
Total Protein: 7.6 g/dL (ref 6.5–8.1)

## 2023-10-29 LAB — CBC WITH DIFFERENTIAL/PLATELET
Abs Immature Granulocytes: 0.01 10*3/uL (ref 0.00–0.07)
Basophils Absolute: 0 10*3/uL (ref 0.0–0.1)
Basophils Relative: 0 %
Eosinophils Absolute: 0.2 10*3/uL (ref 0.0–0.5)
Eosinophils Relative: 2 %
HCT: 40.5 % (ref 39.0–52.0)
Hemoglobin: 13.7 g/dL (ref 13.0–17.0)
Immature Granulocytes: 0 %
Lymphocytes Relative: 34 %
Lymphs Abs: 2.7 10*3/uL (ref 0.7–4.0)
MCH: 31.4 pg (ref 26.0–34.0)
MCHC: 33.8 g/dL (ref 30.0–36.0)
MCV: 92.9 fL (ref 80.0–100.0)
Monocytes Absolute: 0.7 10*3/uL (ref 0.1–1.0)
Monocytes Relative: 9 %
Neutro Abs: 4.3 10*3/uL (ref 1.7–7.7)
Neutrophils Relative %: 55 %
Platelets: 286 10*3/uL (ref 150–400)
RBC: 4.36 MIL/uL (ref 4.22–5.81)
RDW: 12.8 % (ref 11.5–15.5)
WBC: 7.9 10*3/uL (ref 4.0–10.5)
nRBC: 0 % (ref 0.0–0.2)

## 2023-10-29 LAB — URINALYSIS, ROUTINE W REFLEX MICROSCOPIC
Bilirubin Urine: NEGATIVE
Glucose, UA: NEGATIVE mg/dL
Hgb urine dipstick: NEGATIVE
Ketones, ur: NEGATIVE mg/dL
Leukocytes,Ua: NEGATIVE
Nitrite: NEGATIVE
Protein, ur: NEGATIVE mg/dL
Specific Gravity, Urine: 1.001 — ABNORMAL LOW (ref 1.005–1.030)
pH: 7 (ref 5.0–8.0)

## 2023-10-29 NOTE — ED Triage Notes (Signed)
 Pt has hx of TBI.  Was reportedly hitting head on the wall and had a fall.  Pt is alert to baseline currently and conversing as normal in triage.

## 2023-10-29 NOTE — Discharge Instructions (Signed)
 You have been diagnosed with seizure, minor head injury.  CT of the head and neck were negative for fracture or intracranial hemorrhage, your blood work and urinalysis came back normal ruling out any kind of infection.  Please take plenty of fluids to prevent dehydration.  Please continue taking your seizure medication.  Please come back to ED or go to your PCP if you have new symptoms or symptoms worsen

## 2023-10-29 NOTE — ED Notes (Signed)
 Patient transported to CT
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
## 2023-10-29 NOTE — ED Notes (Addendum)
 [employer]onna Se[employer], client Training and development officer for [facility] coming to get pt

## 2023-10-31 ENCOUNTER — Other Ambulatory Visit: Payer: Self-pay

## 2023-10-31 ENCOUNTER — Emergency Department
Admission: EM | Admit: 2023-10-31 | Discharge: 2023-10-31 | Disposition: A | Discharge status: Home / Self Care | Attending: Emergency Medicine | Admitting: Emergency Medicine

## 2023-10-31 DIAGNOSIS — Z593 Problems related to living in residential institution: Secondary | ICD-10-CM | POA: Diagnosis not present

## 2023-10-31 DIAGNOSIS — R569 Unspecified convulsions: Secondary | ICD-10-CM | POA: Diagnosis not present

## 2023-10-31 DIAGNOSIS — Z789 Other specified health status: Secondary | ICD-10-CM

## 2023-10-31 LAB — COMPREHENSIVE METABOLIC PANEL WITH GFR
ALT: 18 U/L (ref 0–44)
AST: 24 U/L (ref 15–41)
Albumin: 4.5 g/dL (ref 3.5–5.0)
Alkaline Phosphatase: 45 U/L (ref 38–126)
Anion gap: 11 (ref 5–15)
BUN: 13 mg/dL (ref 6–20)
CO2: 24 mmol/L (ref 22–32)
Calcium: 9.2 mg/dL (ref 8.9–10.3)
Chloride: 104 mmol/L (ref 98–111)
Creatinine, Ser: 0.97 mg/dL (ref 0.61–1.24)
GFR, Estimated: >60 mL/min (ref >60)
Glucose, Bld: 125 mg/dL — ABNORMAL HIGH (ref 70–99)
Potassium: 3.4 mmol/L — ABNORMAL LOW (ref 3.5–5.1)
Sodium: 139 mmol/L (ref 135–145)
Total Bilirubin: 0.5 mg/dL (ref 0.0–1.2)
Total Protein: 7.9 g/dL (ref 6.5–8.1)

## 2023-10-31 LAB — CBC
HCT: 40.1 % (ref 39.0–52.0)
Hemoglobin: 13.4 g/dL (ref 13.0–17.0)
MCH: 30.7 pg (ref 26.0–34.0)
MCHC: 33.4 g/dL (ref 30.0–36.0)
MCV: 91.8 fL (ref 80.0–100.0)
Platelets: 314 10*3/uL (ref 150–400)
RBC: 4.37 MIL/uL (ref 4.22–5.81)
RDW: 12.7 % (ref 11.5–15.5)
WBC: 6.7 10*3/uL (ref 4.0–10.5)
nRBC: 0 % (ref 0.0–0.2)

## 2023-10-31 LAB — ETHANOL: Alcohol, Ethyl (B): <15 mg/dL (ref <15)

## 2023-10-31 NOTE — ED Provider Notes (Signed)
 [facility] Provider Note    Event Date/Time   First MD Initiated Contact with Patient 10/31/23 1627     (approximate)   History   Wants to change group home  HPI  Eddie Berry is a 38 y.o. male who presents to the emergency department today stating he wants to switch group homes.  He states he is not happy there.  He denies any SI or HI. Denies any medical complaints.       Physical Exam   Triage Vital Signs: ED Triage Vitals  Encounter Vitals Group     BP 10/31/23 1442 (!) 126/98     Girls Systolic BP Percentile --      Girls Diastolic BP Percentile --      Boys Systolic BP Percentile --      Boys Diastolic BP Percentile --      Pulse Rate 10/31/23 1442 88     Resp 10/31/23 1442 16     Temp 10/31/23 1442 97.8 F (36.6 C)     Temp Source 10/31/23 1442 Oral     SpO2 10/31/23 1442 96 %     Weight 10/31/23 1443 229 lb 4.5 oz (104 kg)     Height --      Head Circumference --      Peak Flow --      Pain Score 10/31/23 1443 0     Pain Loc --      Pain Education --      Exclude from Growth Chart --     Most recent vital signs: Vitals:   10/31/23 1442  BP: (!) 126/98  Pulse: 88  Resp: 16  Temp: 97.8 F (36.6 C)  SpO2: 96%   General: Awake, alert, oriented. CV:  Good peripheral perfusion. Regular rate and rhythm. Resp:  Normal effort.  Abd:  No distention.  Other:  Calm.   ED Results / Procedures / Treatments   Labs (all labs ordered are listed, but only abnormal results are displayed) Labs Reviewed  COMPREHENSIVE METABOLIC PANEL WITH GFR - Abnormal; Notable for the following components:      Result Value   Potassium 3.4 (*)    Glucose, Bld 125 (*)    All other components within normal limits  ETHANOL  CBC  URINE DRUG SCREEN, QUALITATIVE ([facility] ONLY)     EKG  None   RADIOLOGY None   PROCEDURES:  Critical Care performed: No   MEDICATIONS ORDERED IN ED: Medications - No data to display   IMPRESSION /  MDM / ASSESSMENT AND PLAN / ED COURSE  I reviewed the triage vital signs and the nursing notes.                              Differential diagnosis includes, but is not limited to, psychiatric illness, malingering, group home issues  Patient's presentation is most consistent with acute presentation with potential threat to life or bodily function.   Patient presented to the emergency department today because he states he wants to switch group home.  I did have a discussion with the patient.  I told him that that is something that his legal guardian and the group home can help with.  Patient denies any acute medical complaints.  Denies any SI or HI.  At this time do not feel patient needs any further emergent workup.   FINAL CLINICAL IMPRESSION(S) / ED DIAGNOSES   Final  diagnoses:  Lives in group home      Note:  This document was prepared using Conservation officer, historic buildings and may include unintentional dictation errors.]    Floy Roberts, MD 10/31/23 [phone]

## 2023-10-31 NOTE — ED Triage Notes (Signed)
 Patient from Group Home. Left Group Home, was walking streets. Initially went to [facility] for evaluation.  Patient then stated he wanted to come to ED for evaluation. Denies SI/HI. States he wants new placement in a new group home.  Group Home Contact:  [name] Sharps [phone]  Legal Guardian contacted at [facility]  Patient presents voluntarily.

## 2023-10-31 NOTE — ED Notes (Signed)
 Attempted to call guardian to inform of discharge back to group home x 3 Spoke with [name] at [facility] who states they are coming to get patient

## 2023-10-31 NOTE — ED Notes (Signed)
 Pt Belonging:  Pt dressed out by this tech and [employer] Officer [employer] and change Black watch Black underwear Camo hat Blue underwear [employer] Tan [employer] White socks

## 2023-11-03 ENCOUNTER — Emergency Department
Admission: EM | Admit: 2023-11-03 | Discharge: 2023-11-03 | Disposition: A | Discharge status: Home / Self Care | Attending: Emergency Medicine | Admitting: Emergency Medicine

## 2023-11-03 ENCOUNTER — Emergency Department

## 2023-11-03 ENCOUNTER — Other Ambulatory Visit: Payer: Self-pay

## 2023-11-03 ENCOUNTER — Ambulatory Visit (INDEPENDENT_AMBULATORY_CARE_PROVIDER_SITE_OTHER): Admitting: Family Medicine

## 2023-11-03 ENCOUNTER — Encounter: Payer: Self-pay | Admitting: Family Medicine

## 2023-11-03 VITALS — BP 118/74 | HR 95 | Resp 22

## 2023-11-03 DIAGNOSIS — R55 Syncope and collapse: Secondary | ICD-10-CM | POA: Diagnosis not present

## 2023-11-03 DIAGNOSIS — G9389 Other specified disorders of brain: Secondary | ICD-10-CM | POA: Diagnosis not present

## 2023-11-03 DIAGNOSIS — Z87820 Personal history of traumatic brain injury: Secondary | ICD-10-CM

## 2023-11-03 DIAGNOSIS — R569 Unspecified convulsions: Secondary | ICD-10-CM

## 2023-11-03 DIAGNOSIS — R519 Headache, unspecified: Secondary | ICD-10-CM | POA: Diagnosis not present

## 2023-11-03 DIAGNOSIS — R531 Weakness: Secondary | ICD-10-CM | POA: Diagnosis not present

## 2023-11-03 DIAGNOSIS — R9082 White matter disease, unspecified: Secondary | ICD-10-CM | POA: Diagnosis not present

## 2023-11-03 DIAGNOSIS — W19XXXA Unspecified fall, initial encounter: Secondary | ICD-10-CM | POA: Diagnosis not present

## 2023-11-03 LAB — CBC WITH DIFFERENTIAL/PLATELET
Abs Immature Granulocytes: 0.02 10*3/uL (ref 0.00–0.07)
Basophils Absolute: 0 10*3/uL (ref 0.0–0.1)
Basophils Relative: 1 %
Eosinophils Absolute: 0.1 10*3/uL (ref 0.0–0.5)
Eosinophils Relative: 3 %
HCT: 42 % (ref 39.0–52.0)
Hemoglobin: 13.9 g/dL (ref 13.0–17.0)
Immature Granulocytes: 0 %
Lymphocytes Relative: 39 %
Lymphs Abs: 2.1 10*3/uL (ref 0.7–4.0)
MCH: 31.1 pg (ref 26.0–34.0)
MCHC: 33.1 g/dL (ref 30.0–36.0)
MCV: 94 fL (ref 80.0–100.0)
Monocytes Absolute: 0.6 10*3/uL (ref 0.1–1.0)
Monocytes Relative: 11 %
Neutro Abs: 2.5 10*3/uL (ref 1.7–7.7)
Neutrophils Relative %: 46 %
Platelets: 296 10*3/uL (ref 150–400)
RBC: 4.47 MIL/uL (ref 4.22–5.81)
RDW: 12.9 % (ref 11.5–15.5)
WBC: 5.4 10*3/uL (ref 4.0–10.5)
nRBC: 0 % (ref 0.0–0.2)

## 2023-11-03 LAB — BASIC METABOLIC PANEL WITH GFR
Anion gap: 10 (ref 5–15)
BUN: 15 mg/dL (ref 6–20)
CO2: 25 mmol/L (ref 22–32)
Calcium: 9.3 mg/dL (ref 8.9–10.3)
Chloride: 105 mmol/L (ref 98–111)
Creatinine, Ser: 0.94 mg/dL (ref 0.61–1.24)
GFR, Estimated: >60 mL/min (ref >60)
Glucose, Bld: 91 mg/dL (ref 70–99)
Potassium: 3.9 mmol/L (ref 3.5–5.1)
Sodium: 140 mmol/L (ref 135–145)

## 2023-11-03 LAB — TROPONIN I (HIGH SENSITIVITY)
Troponin I (High Sensitivity): <2 ng/L (ref <18)
Troponin I (High Sensitivity): <2 ng/L (ref <18)

## 2023-11-03 LAB — VALPROIC ACID LEVEL: Valproic Acid Lvl: 39 ug/mL — ABNORMAL LOW (ref 50–100)

## 2023-11-03 LAB — GLUCOSE, POCT (MANUAL RESULT ENTRY): POC Glucose: 98 mg/dL (ref 70–99)

## 2023-11-03 NOTE — ED Provider Notes (Signed)
 [facility] Provider Note    Event Date/Time   First MD Initiated Contact with Patient 11/03/23 1004     (approximate)   History   Chief Complaint Loss of Consciousness   HPI  Eddie Berry is a 38 y.o. male with past medical history of TBI, seizure-like activity, and pulmonary embolism who presents to the ED complaining of syncope.  Per EMS, patient was at his primary care doctor's office for an appointment when he lost consciousness while sitting in a chair.  This occurred in front of staff and there was no seizure activity witnessed, patient denies tongue biting or urinary incontinence.  When EMS arrived, patient woke upon being sat up straight.  He currently complains of a headache but no fall or head injury was reported by EMS.  He denies any chest pain or shortness of breath, states he had been feeling well earlier in the day.     Physical Exam   Triage Vital Signs: ED Triage Vitals [11/03/23 1004]  Encounter Vitals Group     BP      Girls Systolic BP Percentile      Girls Diastolic BP Percentile      Boys Systolic BP Percentile      Boys Diastolic BP Percentile      Pulse      Resp      Temp      Temp src      SpO2      Weight 230 lb (104.3 kg)     Height 6' (1.829 m)     Head Circumference      Peak Flow      Pain Score 7     Pain Loc      Pain Education      Exclude from Growth Chart     Most recent vital signs: Vitals:   11/03/23 1100 11/03/23 1130  BP: (!) 137/122 116/88  Pulse: 87 86  Resp: (!) 25 18  Temp:    SpO2: 99% 98%    Constitutional: Alert and oriented. Eyes: Conjunctivae are normal. Head: Atraumatic. Nose: No congestion/rhinnorhea. Mouth/Throat: Mucous membranes are moist.  Neck: No midline cervical spine tenderness to palpation. Cardiovascular: Normal rate, regular rhythm. Grossly normal heart sounds.  2+ radial pulses bilaterally. Respiratory: Normal respiratory effort.  No retractions. Lungs  CTAB. Gastrointestinal: Soft and nontender. No distention. Musculoskeletal: No lower extremity tenderness nor edema.  Neurologic:  Normal speech and language.  Chronic right-sided facial droop, otherwise no gross focal neurologic deficits are appreciated.    ED Results / Procedures / Treatments   Labs (all labs ordered are listed, but only abnormal results are displayed) Labs Reviewed  VALPROIC ACID  LEVEL - Abnormal; Notable for the following components:      Result Value   Valproic Acid  Lvl 39 (*)    All other components within normal limits  CBC WITH DIFFERENTIAL/PLATELET  BASIC METABOLIC PANEL WITH GFR  TROPONIN I (HIGH SENSITIVITY)  TROPONIN I (HIGH SENSITIVITY)     EKG  ED ECG REPORT I, Carlin Palin, the attending physician, personally viewed and interpreted this ECG.   Date: 11/03/2023  EKG Time: 10:10  Rate: 87  Rhythm: normal sinus rhythm  Axis: Normal  Intervals:none  ST&T Change: None  RADIOLOGY CT head reviewed and interpreted by me with no hemorrhage or midline shift.  PROCEDURES:  Critical Care performed: No  Procedures   MEDICATIONS ORDERED IN ED: Medications - No data to display  IMPRESSION / MDM / ASSESSMENT AND PLAN / ED COURSE  I reviewed the triage vital signs and the nursing notes.                              38 y.o. male with past medical history of TBI, seizure-like activity, and pulmonary embolism who presents to the ED following episode of loss of consciousness at his PCPs office.  Patient's presentation is most consistent with acute presentation with potential threat to life or bodily function.  Differential diagnosis includes, but is not limited to, syncope, seizure, pseudoseizure, arrhythmia, vasovagal episode, orthostatic hypotension, anemia, electrolyte abnormality, AKI.  Patient arrives nontoxic-appearing and in no acute distress, vital signs are unremarkable.  He is awake and alert, complains only of headache.  He has  chronic right-sided facial droop that is unchanged today, no focal neurologic deficits noted otherwise.  We will check CT head given acute headache, observe on cardiac monitor, and screening labs.  CT head is negative for acute process, labs are reassuring with no significant anemia, leukocytosis, electrolyte abnormality, or AKI.  LFTs are unremarkable, Depakote  level subtherapeutic.  2 sets of troponin within normal limits and I doubt cardiac etiology for his syncopal episode.  Patient ambulatory without difficulty, headache resolved, and he is appropriate for discharge home with outpatient follow-up.      FINAL CLINICAL IMPRESSION(S) / ED DIAGNOSES   Final diagnoses:  Syncope, unspecified syncope type     Rx / DC Orders   ED Discharge Orders     None        Note:  This document was prepared using Dragon voice recognition software and may include unintentional dictation errors.   Willo Dunnings, MD 11/03/23 [phone]

## 2023-11-03 NOTE — Progress Notes (Signed)
 Established patient visit   Patient: Eddie Berry   DOB: Oct 20, 1985   38 y.o. Male  MRN: [id number] Visit Date: 11/03/2023  Today's healthcare provider: LAURAINE LOISE BUOY, DO   Chief Complaint  Patient presents with   Medicare Wellness   Subjective    HPI  Per report, the CMA Mikle) was rooming the patient, and the patient fell off the bed and became unresponsive.  I heard the patient fall, which sounded as though was against the door.  When I tried into the room, he was right in front of the door and had to be maneuvered by Hargis in order for me to enter.  - VSS measured and WNL/stable.  [facility] EMS was called, given the patient's apparent seizure.  On exam, the patient's eyes exhibited mild nystagmus and rolled toward posterior superior left side of head, with the left eyelid mostly closed.  - Did not respond to his name, being shaken, cuticle pressure or sternal rub. He did not protect his head from allowing his arm to hit it while not responding, though he did blink.  - Later in the episode, shortly before EMS arrived, patient was told he was observed opening his left eye, and it was noted that he immediately closed it again.    Discussed with patient's caregiver, [name], after the patient departed with EMS: [name] noted that, he was asking about his upcoming appointment in [place] this afternoon prior to the episode, which he typically does not want to go to.  [name] noted that the patient began drooling and placed his arm above his head, in a motion that appeared to be intended to protect his head, prior to falling toward the left of the bed where there was plenty of space.  - He notes that the patient typically has these episodes at times when he wants to avoid doing something and in an effort to have soda and coffee, which [name] limits at the group home. - He reports the patient has an episode like this at least once per week  In the record, on 10/31/2023, the patient did  report having 5 seizures in a day.  In the same visit, it was noted that he was protesting returning to his group home.      Medications: Outpatient Medications Prior to Visit  Medication Sig   acetaminophen  (TYLENOL ) 500 MG tablet Take 1 tablet (500 mg total) by mouth every 8 (eight) hours as needed for mild pain (pain score 1-3), moderate pain (pain score 4-6), fever or headache.   albuterol  (VENTOLIN  HFA) 108 (90 Base) MCG/ACT inhaler Inhale 2 puffs into the lungs every 6 (six) hours as needed for wheezing or shortness of breath.   cetirizine (ZYRTEC) 10 MG tablet Take 10 mg by mouth daily.   Cholecalciferol  (VITAMIN D3) 50 MCG (2000 UT) TABS TAKE 1 TABLET BY MOUTH EVERY MORNING FOR VIT DAILY INSUFFICIENCY *TAKE WITH FOOD*   cloZAPine  (CLOZARIL ) 100 MG tablet Take 3 tablets (300 mg total) by mouth daily. Take for total 300 mg once daily   divalproex  (DEPAKOTE ) 250 MG DR tablet Take one tablet every night along with the 500mg  divalproex    divalproex  (DEPAKOTE ) 500 MG DR tablet Take 1 tablet (500 mg total) by mouth 2 (two) times daily.   docusate sodium  (COLACE) 100 MG capsule Take 1 capsule (100 mg total) by mouth daily.   fenofibrate  (TRICOR ) 48 MG tablet TAKE 1 TABLET BY MOUTH EVERY MORNING *DO NOT CRUSH  OR CHEW*   fluticasone  (FLONASE ) 50 MCG/ACT nasal spray Place 2 sprays into both nostrils daily.   hydroxypropyl methylcellulose / hypromellose (ISOPTO TEARS / GONIOVISC) 2.5 % ophthalmic solution Place 2 drops into both eyes 2 (two) times daily.   hydrOXYzine  (ATARAX ) 50 MG tablet Take 25-50 mg by mouth 2 (two) times daily as needed for anxiety.   ibuprofen (ADVIL) 400 MG tablet Take 1.5 tablets (600 mg total) by mouth every 8 (eight) hours as needed.   ipratropium-albuterol  (DUONEB) 0.5-2.5 (3) MG/3ML SOLN Take 3 mLs by nebulization every 6 (six) hours as needed.   lacosamide  (VIMPAT ) 200 MG TABS tablet Take 1 tablet (200 mg total) by mouth 2 (two) times daily.   levothyroxine   (SYNTHROID ) 25 MCG tablet TAKE 1 TABLET BY MOUTH ONCE DAILY *TAKE ON AN EMPTY STOMACH*   Melatonin 10 MG TABS Take 10 mg by mouth at bedtime as needed (Sleep).   Multiple Vitamin (THEREMS PO) Take 1 tablet by mouth daily.   Multiple Vitamin (THEREMS) TABS TAKE 1 TABLET BY MOUTH EVERY MORNING FOR NUTRITIONAL SUPPLEMENT   MYRBETRIQ  50 MG TB24 tablet TAKE 1 TABLET BY MOUTH EVERY MORNING FOR OVERACTIVE BLADDER *DO NOT CRUSH OR CHEW*   omeprazole (PRILOSEC) 20 MG capsule Take 20 mg by mouth daily.   paliperidone  (INVEGA ) 6 MG 24 hr tablet Take 12 mg by mouth every morning.   propranolol  (INDERAL ) 10 MG tablet Take 10 mg by mouth daily.   sertraline  (ZOLOFT ) 100 MG tablet Take 150 mg by mouth at bedtime.   zonisamide  (ZONEGRAN ) 50 MG capsule Take 1 capsule (50 mg total) by mouth daily.   No facility-administered medications prior to visit.    Review of Systems UTA      Objective    There were no vitals taken for this visit.    Physical Exam Constitutional:      Appearance: He is obese.   Cardiovascular:     Rate and Rhythm: Normal rate and regular rhythm.     Heart sounds: Normal heart sounds.  Pulmonary:     Breath sounds: Examination of the right-upper field reveals wheezing. Examination of the left-upper field reveals wheezing. Wheezing present.   Skin:    General: Skin is moist.   Neurological:     Mental Status: He is unresponsive.   On exam, the patient's eyes exhibited mild nystagmus and rolled toward posterior superior left side of head, with the left eyelid mostly closed.  - Did not respond to his name, being shaken, cuticle pressure or sternal rub. He did not protect his head from allowing his arm to hit it while not responding, though he did blink.      No results found for any visits on 11/03/23.  Assessment & Plan    Seizure-like activity [facility])  Personal history of traumatic brain injury   EMS was called and patient was transported to the emergency  department.   Patient currently on divalproex  500 milligrams twice daily, which [name] states he gets and checks to make sure the patient is taking it consistently.  His valproate level has been slowly increasing from the end of May through the end of June from 26 to 42.  Return in about 4 weeks (around 12/01/2023) for Welcome to Medicare visit; TSH check.      I discussed the assessment and treatment plan with the patient  The patient was provided an opportunity to ask questions and all were answered. The patient agreed with the plan and demonstrated  an understanding of the instructions.   The patient was advised to call back or seek an in-person evaluation if the symptoms worsen or if the condition fails to improve as anticipated.    LAURAINE LOISE BUOY, DO  [facility] [phone] (phone) [phone] (fax)  [facility]

## 2023-11-03 NOTE — ED Triage Notes (Signed)
 Pt arrives via [facility] from PCP appointment after having syncopal episode. Per EMS, pt was responsive to tactile stimuli on scene. Pt depakote  recently increased. Pt arrives to ER alert and oriented.

## 2023-11-05 ENCOUNTER — Encounter: Payer: Self-pay | Admitting: Emergency Medicine

## 2023-11-05 ENCOUNTER — Other Ambulatory Visit: Payer: Self-pay | Admitting: Family Medicine

## 2023-11-05 ENCOUNTER — Emergency Department
Admission: EM | Admit: 2023-11-05 | Discharge: 2023-11-05 | Disposition: A | Discharge status: Home / Self Care | Source: Other Acute Inpatient Hospital | Attending: Emergency Medicine | Admitting: Emergency Medicine

## 2023-11-05 ENCOUNTER — Other Ambulatory Visit: Payer: Self-pay

## 2023-11-05 DIAGNOSIS — R059 Cough, unspecified: Secondary | ICD-10-CM | POA: Diagnosis not present

## 2023-11-05 DIAGNOSIS — R9431 Abnormal electrocardiogram [ECG] [EKG]: Secondary | ICD-10-CM | POA: Diagnosis not present

## 2023-11-05 DIAGNOSIS — R569 Unspecified convulsions: Secondary | ICD-10-CM | POA: Diagnosis not present

## 2023-11-05 DIAGNOSIS — W19XXXA Unspecified fall, initial encounter: Secondary | ICD-10-CM | POA: Diagnosis not present

## 2023-11-05 LAB — COMPREHENSIVE METABOLIC PANEL WITH GFR
ALT: 15 U/L (ref 0–44)
AST: 18 U/L (ref 15–41)
Albumin: 4.3 g/dL (ref 3.5–5.0)
Alkaline Phosphatase: 42 U/L (ref 38–126)
Anion gap: 8 (ref 5–15)
BUN: 15 mg/dL (ref 6–20)
CO2: 28 mmol/L (ref 22–32)
Calcium: 9.3 mg/dL (ref 8.9–10.3)
Chloride: 105 mmol/L (ref 98–111)
Creatinine, Ser: 1.03 mg/dL (ref 0.61–1.24)
GFR, Estimated: >60 mL/min (ref >60)
Glucose, Bld: 87 mg/dL (ref 70–99)
Potassium: 4.1 mmol/L (ref 3.5–5.1)
Sodium: 141 mmol/L (ref 135–145)
Total Bilirubin: 0.6 mg/dL (ref 0.0–1.2)
Total Protein: 7.5 g/dL (ref 6.5–8.1)

## 2023-11-05 LAB — CBC
HCT: 41.8 % (ref 39.0–52.0)
Hemoglobin: 13.6 g/dL (ref 13.0–17.0)
MCH: 30.8 pg (ref 26.0–34.0)
MCHC: 32.5 g/dL (ref 30.0–36.0)
MCV: 94.6 fL (ref 80.0–100.0)
Platelets: 296 10*3/uL (ref 150–400)
RBC: 4.42 MIL/uL (ref 4.22–5.81)
RDW: 12.9 % (ref 11.5–15.5)
WBC: 6.7 10*3/uL (ref 4.0–10.5)
nRBC: 0 % (ref 0.0–0.2)

## 2023-11-05 LAB — URINALYSIS, ROUTINE W REFLEX MICROSCOPIC
Bilirubin Urine: NEGATIVE
Glucose, UA: NEGATIVE mg/dL
Hgb urine dipstick: NEGATIVE
Ketones, ur: NEGATIVE mg/dL
Leukocytes,Ua: NEGATIVE
Nitrite: NEGATIVE
Protein, ur: NEGATIVE mg/dL
Specific Gravity, Urine: 1.006 (ref 1.005–1.030)
pH: 8 (ref 5.0–8.0)

## 2023-11-05 LAB — VALPROIC ACID LEVEL: Valproic Acid Lvl: 38 ug/mL — ABNORMAL LOW (ref 50–100)

## 2023-11-05 LAB — CBG MONITORING, ED: Glucose-Capillary: 91 mg/dL (ref 70–99)

## 2023-11-05 MED ORDER — VALPROIC ACID 250 MG PO CAPS
500.0000 mg | ORAL_CAPSULE | Freq: Once | ORAL | Status: AC
  Administered 2023-11-05: 500 mg via ORAL
  Filled 2023-11-05: qty 2

## 2023-11-05 NOTE — ED Notes (Signed)
 Pt got up from ED stretcher and walked out of the department, ED security was notified and assisted with getting patient back into the building, pt calm and cooperative, returned without incident.

## 2023-11-05 NOTE — ED Notes (Signed)
 Patient had fall risk bundle in place. Patient continued to climb out of bed. Fall alarm went off. RN educated patient on importance of waiting for staff.

## 2023-11-05 NOTE — ED Notes (Addendum)
 After MD explained to patient that he would be discharged home. Patient pulled off all monitoring leads and was walking outside the hospital. RN attempted to ask patient to stay patient did not answer RN and continued walking outside the ER. RN also offered multiple times that a MD could be brought back in to see patient. Security was called and they did go out and bring back the patient into the ER. The patient is now in [facility] from ED 18.

## 2023-11-05 NOTE — ED Provider Notes (Signed)
 [facility] Provider Note    Event Date/Time   First MD Initiated Contact with Patient 11/05/23 1043     (approximate)   History   Loss of Consciousness   HPI  Eddie Berry is a 38 y.o. male with a history of TBI, seizure-like activity, and PE who presents with seizure-like episode while waiting in urgent care.  The patient states that he has had increased seizure-like episodes recently.  His medications were recently changed but he states it does not seem like they are helping.  He also states that he is unhappy at his group home and wants to go somewhere else.  He reports a headache but denies other acute symptoms.  Per the triage RN, he fell from the wheelchair while being transferred but did not have any apparent injury or loss of consciousness.  No seizure-like activity at that time.  I reviewed the past medical records.  The patient has numerous recent prior ED visits: On 6/30 for syncope, on 6/27 wanting to switch group homes, on 6/25 for a seizure-like episode, on 6/24 for seizure-like episode, and on 6/8 for another seizure-like episode.  He was admitted to the hospitalist service for breakthrough seizures in May.  Continuous EEG did not show any seizure activity.  He saw neurology on 6/9 for follow-up.  He is on lacosamide  and Depakote , which was increased at that time.   Physical Exam   Triage Vital Signs: ED Triage Vitals  Encounter Vitals Group     BP 11/05/23 1045 (!) 124/98     Girls Systolic BP Percentile --      Girls Diastolic BP Percentile --      Boys Systolic BP Percentile --      Boys Diastolic BP Percentile --      Pulse Rate 11/05/23 1045 88     Resp 11/05/23 1045 17     Temp 11/05/23 1046 97.9 F (36.6 C)     Temp Source 11/05/23 1046 Oral     SpO2 11/05/23 1045 100 %     Weight 11/05/23 1046 229 lb 4.5 oz (104 kg)     Height 11/05/23 1046 6' (1.829 m)     Head Circumference --      Peak Flow --      Pain Score 11/05/23  1046 10     Pain Loc --      Pain Education --      Exclude from Growth Chart --     Most recent vital signs: Vitals:   11/05/23 1045 11/05/23 1046  BP: (!) 124/98   Pulse: 88   Resp: 17   Temp:  97.9 F (36.6 C)  SpO2: 100%      General: , Comfortable appearing, no distress.  CV:  Good peripheral perfusion.  Resp:  Normal effort.  Lungs CTAB. Abd:  No distention.  Other:  Right facial droop, chronic.  EOMI.  PERRLA.  No photophobia.  Normal speech.  Neck supple, full ROM.  Motor intact in all extremities.  No ataxia.   ED Results / Procedures / Treatments   Labs (all labs ordered are listed, but only abnormal results are displayed) Labs Reviewed  URINALYSIS, ROUTINE W REFLEX MICROSCOPIC - Abnormal; Notable for the following components:      Result Value   Color, Urine YELLOW (*)    APPearance CLEAR (*)    All other components within normal limits  VALPROIC ACID  LEVEL - Abnormal; Notable for the following  components:   Valproic Acid  Lvl 38 (*)    All other components within normal limits  COMPREHENSIVE METABOLIC PANEL WITH GFR  CBC  CBG MONITORING, ED     EKG  ED ECG REPORT I, Eddie Berry, the attending physician, personally viewed and interpreted this ECG.  Date: 11/05/2023 EKG Time: 1046 Rate: 79 Rhythm: normal sinus rhythm QRS Axis: normal Intervals: normal ST/T Wave abnormalities: normal Narrative Interpretation: no evidence of acute ischemia    RADIOLOGY    PROCEDURES:  Critical Care performed: No  Procedures   MEDICATIONS ORDERED IN ED: Medications  valproic acid  (DEPAKENE ) 250 MG capsule 500 mg (has no administration in time range)     IMPRESSION / MDM / ASSESSMENT AND PLAN / ED COURSE  I reviewed the triage vital signs and the nursing notes.  38 year old male with PMH as noted above and a chronic history of seizure-like episodes for which he is on Vimpat  and Depakote  presents with an apparent seizure-like episode, also  requesting to be sent to a different group home.  He did fall out of the wheelchair while in triage but did not suffer any apparent injury.  Neurologic exam is nonfocal except for a right facial droop which is chronic.  Differential diagnosis includes, but is not limited to, pseudoseizure, epileptic seizure, syncope.  We will obtain labs and observe the patient for additional episodes.  There is no indication for imaging at this time.  Patient's presentation is most consistent with acute presentation with potential threat to life or bodily function.  The patient is on the cardiac monitor to evaluate for evidence of arrhythmia and/or significant heart rate changes.   ----------------------------------------- 2:39 PM on 11/05/2023 -----------------------------------------  Lab workup is unremarkable.  CMP and CBC show no acute findings.  Urinalysis is clear.  Depakote  level is slightly low so I have ordered an extra dose.  He has not had any further spells while in the ED.  He is stable for discharge at this time.  The patient expressed that he wants to go to a different group home and he is unhappy at the current group home.  He has been seen in the ED specifically for this concern before.  However, this will need to be pursued through his guardian.  There is no indication for emergent TOC consult.  I advised the patient about this and he thanked me for my help.  However, subsequently he apparently got upset and started to walk out of the hospital.  He was escorted back by security.  He has a legal guardian and cannot leave on his own.  We will contact the group home for pickup.   FINAL CLINICAL IMPRESSION(S) / ED DIAGNOSES   Final diagnoses:  Seizure-like activity (HCC)     Rx / DC Orders   ED Discharge Orders     None        Note:  This document was prepared using Dragon voice recognition software and may include unintentional dictation errors.    Berry Waylon,  MD 11/05/23 1441

## 2023-11-05 NOTE — ED Notes (Signed)
 Placed patient on the toilet per request to perform a bowel movement. Instructed patient to pull call button when finished. Patient AOX4 and verbally stated he understood the instructions he was given.

## 2023-11-05 NOTE — ED Notes (Signed)
 Group home updated. Group home called

## 2023-11-05 NOTE — ED Notes (Signed)
 Patient provided with coffee, water, and cola as requested.

## 2023-11-05 NOTE — ED Notes (Addendum)
 First Nurse Note: Pt to ED via [facility] from urgent care. Pt had cough and when pt fell out in the floor. EMS reports pt has been uncooperative with them. While RN was getting report pt lifted arm on the wheelchair and rolled out and onto the floor. Pt had no visible injures, pt laying the floor with his eye opens. Pt then sat up and with the assistance this RN and EMT was placed on a stretcher and sent to an open treatment room. Charge RN and MD were notified of patient falling.

## 2023-11-05 NOTE — ED Notes (Signed)
 Spoke [facility]- pt up for discharge and group home manager will come pick him up. Pt informed and is not happy about leaving.

## 2023-11-05 NOTE — ED Notes (Addendum)
 Attempted to call legal guardian No answer. Left voicemail and call back number.

## 2023-11-05 NOTE — ED Triage Notes (Signed)
 Patient to ED via [facility] from [facility]- being seen for a cough when patient fell into floor at [facility]. PT states he hit his head. Pt noted to put himself into ED waiting room floor per EMS staff. Patient Aox4 and moving all extremities- pt placed himself into ED stretcher. C/o pain in head.

## 2023-11-05 NOTE — Discharge Instructions (Signed)
 Continue taking your Depakote  and Vimpat  as prescribed.  Follow-up with your neurologist.  Return to the ER for any new or worsening seizure-like episodes, passing out, weakness, or any other new or worsening symptoms that concern you.

## 2023-11-06 ENCOUNTER — Other Ambulatory Visit: Payer: Self-pay | Admitting: Family Medicine

## 2023-11-11 ENCOUNTER — Encounter: Admitting: Family Medicine

## 2023-11-11 ENCOUNTER — Other Ambulatory Visit: Payer: Self-pay | Admitting: Family Medicine

## 2023-11-11 NOTE — Telephone Encounter (Signed)
 Called Eddie Berry,no answer message was left to call back. Regarding if pt is currently on medication as it is not on medicine list and we received a refill request on medication.

## 2023-11-12 NOTE — Telephone Encounter (Signed)
 Spoke with mack from group home and he confirmed with manager pt is on medication.

## 2023-11-12 NOTE — Telephone Encounter (Signed)
 Spoke with jennifer sims. She gave me group home manager number since they are the people that would know each individual medication. Will call Tonna Brambleton at [phone] to confirm if pt is currently on medication.

## 2023-11-13 ENCOUNTER — Other Ambulatory Visit: Payer: Self-pay | Admitting: Physician Assistant

## 2023-11-13 ENCOUNTER — Ambulatory Visit: Admitting: Physician Assistant

## 2023-11-13 VITALS — BP 106/70 | HR 74 | Resp 16 | Ht 72.0 in | Wt 225.0 lb

## 2023-11-13 DIAGNOSIS — G40909 Epilepsy, unspecified, not intractable, without status epilepticus: Secondary | ICD-10-CM

## 2023-11-13 DIAGNOSIS — E038 Other specified hypothyroidism: Secondary | ICD-10-CM

## 2023-11-13 DIAGNOSIS — J3089 Other allergic rhinitis: Secondary | ICD-10-CM

## 2023-11-13 DIAGNOSIS — R350 Frequency of micturition: Secondary | ICD-10-CM

## 2023-11-13 DIAGNOSIS — Z789 Other specified health status: Secondary | ICD-10-CM | POA: Diagnosis not present

## 2023-11-13 DIAGNOSIS — Z79899 Other long term (current) drug therapy: Secondary | ICD-10-CM | POA: Diagnosis not present

## 2023-11-13 DIAGNOSIS — K219 Gastro-esophageal reflux disease without esophagitis: Secondary | ICD-10-CM

## 2023-11-13 DIAGNOSIS — R3589 Other polyuria: Secondary | ICD-10-CM

## 2023-11-13 MED ORDER — FAMOTIDINE 20 MG PO TABS: 20.0000 mg | ORAL_TABLET | Freq: Every day | ORAL | 1 refills | Status: DC

## 2023-11-13 MED ORDER — FLUTICASONE PROPIONATE 50 MCG/ACT NA SUSP: 2.0000 | Freq: Every day | NASAL | 11 refills | Status: DC

## 2023-11-13 NOTE — Progress Notes (Signed)
 Established patient visit  Patient: Eddie Berry   DOB: 08/05/1985   38 y.o. Male  MRN: [id number] Visit Date: 11/13/2023  Today's healthcare provider: Jolynn Spencer, PA-C   Chief Complaint  Patient presents with   Hospitalization Follow-up    Hosp f/u [facility]   Subjective     HPI     Hospitalization Follow-up    Additional comments: Hosp f/u [facility]      Last edited by Marylen Odella CROME, CMA on 11/13/2023  3:46 PM.       Discussed the use of AI scribe software for clinical note transcription with the patient, who gave verbal consent to proceed.  History of Present Illness Eddie Berry is a 38 year old male with a history of seizures who presents for follow-up after an emergency department visit. He is accompanied by [name] Sharps, his house manager.  He is scheduled to see his neurologist next week. Since discharge from the emergency department, there have been no seizure episodes. His Depakote  dosage was increased to 500 mg in the mornings and remains at 500 mg in the evenings.  He has a persistent dry cough for four to five weeks, with nasal congestion, pressure, and postnasal drainage. He has been using Flonase  but ran out recently. He denies wheezing and shortness of breath.  He experiences heartburn and acid reflux every other day and is not taking any medication for it. He reports frequent urination, particularly in the mornings, which he attributes to his medication. He denies nocturia but notes previous occurrences. He is currently taking Zyrtec for allergies.  During the review of symptoms, he denies double vision, blurry vision, sore throat, wheezing, and shortness of breath. He confirms feeling pressure in his ears and nasal congestion.       11/03/2023   10:21 AM 10/01/2023   10:05 AM 08/04/2023   11:19 AM  Depression screen PHQ 2/9  Decreased Interest 3 2 0  Down, Depressed, Hopeless 3 2 0  PHQ - 2 Score 6 4 0  Altered sleeping 3 3 0  Tired,  decreased energy 3 3 0  Change in appetite 2 2 1   Feeling bad or failure about yourself  3 1 0  Trouble concentrating 3 1 0  Moving slowly or fidgety/restless 2 2 0  Suicidal thoughts 2 0 0  PHQ-9 Score 24 16 1   Difficult doing work/chores Somewhat difficult Somewhat difficult Not difficult at all      11/03/2023   10:22 AM 10/01/2023   10:05 AM  GAD 7 : Generalized Anxiety Score  Nervous, Anxious, on Edge 2 0  Control/stop worrying 3 0  Worry too much - different things 3 1  Trouble relaxing 3 1  Restless 2 0  Easily annoyed or irritable 3 1  Afraid - awful might happen 2 0  Total GAD 7 Score 18 3  Anxiety Difficulty Somewhat difficult Somewhat difficult    Medications: Outpatient Medications Prior to Visit  Medication Sig   acetaminophen  (TYLENOL ) 500 MG tablet Take 1 tablet (500 mg total) by mouth every 8 (eight) hours as needed for mild pain (pain score 1-3), moderate pain (pain score 4-6), fever or headache.   albuterol  (VENTOLIN  HFA) 108 (90 Base) MCG/ACT inhaler Inhale 2 puffs into the lungs every 6 (six) hours as needed for wheezing or shortness of breath.   cetirizine (ZYRTEC) 10 MG tablet Take 10 mg by mouth daily.   Cholecalciferol  (VITAMIN D3) 50 MCG (2000 UT)  TABS TAKE 1 TABLET BY MOUTH EVERY MORNING FOR VIT DAILY INSUFFICIENCY *TAKE WITH FOOD*   cloZAPine  (CLOZARIL ) 100 MG tablet Take 3 tablets (300 mg total) by mouth daily. Take for total 300 mg once daily   divalproex  (DEPAKOTE ) 250 MG DR tablet Take one tablet every night along with the 500mg  divalproex    divalproex  (DEPAKOTE ) 500 MG DR tablet Take 1 tablet (500 mg total) by mouth 2 (two) times daily.   docusate sodium  (COLACE) 100 MG capsule Take 1 capsule (100 mg total) by mouth daily.   fenofibrate  (TRICOR ) 48 MG tablet TAKE 1 TABLET BY MOUTH EVERY MORNING *DO NOT CRUSH OR CHEW*   hydroxypropyl methylcellulose / hypromellose (ISOPTO TEARS / GONIOVISC) 2.5 % ophthalmic solution Place 2 drops into both eyes 2  (two) times daily.   hydrOXYzine  (ATARAX ) 50 MG tablet Take 25-50 mg by mouth 2 (two) times daily as needed for anxiety.   ibuprofen (ADVIL) 400 MG tablet Take 1.5 tablets (600 mg total) by mouth every 8 (eight) hours as needed.   ipratropium-albuterol  (DUONEB) 0.5-2.5 (3) MG/3ML SOLN Take 3 mLs by nebulization every 6 (six) hours as needed.   lacosamide  (VIMPAT ) 200 MG TABS tablet Take 1 tablet (200 mg total) by mouth 2 (two) times daily.   levothyroxine  (SYNTHROID ) 25 MCG tablet TAKE 1 TABLET BY MOUTH ONCE DAILY *TAKE ON AN EMPTY STOMACH*   Melatonin 10 MG TABS Take 10 mg by mouth at bedtime as needed (Sleep).   Multiple Vitamin (THEREMS PO) Take 1 tablet by mouth daily.   Multiple Vitamin (THEREMS) TABS TAKE 1 TABLET BY MOUTH EVERY MORNING FOR NUTRITIONAL SUPPLEMENT   MYRBETRIQ  50 MG TB24 tablet TAKE 1 TABLET BY MOUTH EVERY MORNING FOR OVERACTIVE BLADDER *DO NOT CRUSH OR CHEW*   omeprazole (PRILOSEC) 20 MG capsule Take 20 mg by mouth daily.   paliperidone  (INVEGA ) 6 MG 24 hr tablet Take 12 mg by mouth every morning.   propranolol  (INDERAL ) 10 MG tablet Take 10 mg by mouth daily.   sertraline  (ZOLOFT ) 100 MG tablet Take 150 mg by mouth at bedtime.   zonisamide  (ZONEGRAN ) 50 MG capsule Take 1 capsule (50 mg total) by mouth daily.   [DISCONTINUED] fluticasone  (FLONASE ) 50 MCG/ACT nasal spray PLACE 2 SPRAYS INTO BOTH NOSTRILS ONCE DAILY   No facility-administered medications prior to visit.    Review of Systems All negative Except see HPI       Objective    BP 106/70 (BP Location: Left Arm, Patient Position: Sitting, Cuff Size: Large)   Pulse 74   Resp 16   Ht 6' (1.829 m)   Wt 225 lb (102.1 kg)   SpO2 97%   BMI 30.52 kg/m     Physical Exam Vitals reviewed.  Constitutional:      General: He is not in acute distress.    Appearance: Normal appearance. He is not diaphoretic.  HENT:     Head: Normocephalic and atraumatic.  Eyes:     General: No scleral icterus.     Conjunctiva/sclera: Conjunctivae normal.  Cardiovascular:     Rate and Rhythm: Normal rate and regular rhythm.     Pulses: Normal pulses.     Heart sounds: Normal heart sounds. No murmur heard. Pulmonary:     Effort: Pulmonary effort is normal. No respiratory distress.     Breath sounds: Normal breath sounds. No wheezing or rhonchi.  Musculoskeletal:     Cervical back: Neck supple.     Right lower leg: No edema.  Left lower leg: No edema.  Lymphadenopathy:     Cervical: No cervical adenopathy.  Skin:    General: Skin is warm and dry.     Findings: No rash.  Neurological:     Mental Status: He is alert and oriented to person, place, and time. Mental status is at baseline.  Psychiatric:        Mood and Affect: Mood normal.        Behavior: Behavior normal.      No results found for any visits on 11/13/23.      Assessment & Plan Allergic Rhinitis and Sinusitis Nasal congestion, postnasal drainage, sinus pressure, and dry cough likely due to postnasal drip or sinusitis. Flonase  use interrupted. - Refilled Flonase  for one year. - Use nasal saline rinse regularly. - Take OTC antihistamines such as Zyrtec, Allegra, or Claritin . - Patient should be prescribed Robitussin over-the-counter as directed  Gastroesophageal Reflux Disease (GERD) Chronic  heartburn and possible GERD-related cough. Currently on omeprazole. - Continue omeprazole 20 Mg. - Provide Pepcid  20 Mg for faster relief if needed.  Seizure Disorder Chronic and stable  no seizures since last discharge. Depakote  increased to 500 mg BID. - Continue Depakote  500 mg BID. - Follow up with neurologist next week. Last visit with neurologist was in 10/2023 Has past history of TBI/stroke/mood disorder, recently history of ED visit on 11/05/2023 for seizure-like activity Patient has been taking lacosamide  200 mg twice a day and Depakote  250 mg in the morning and 500 mg nightly.   - Continue Lacosamide  200 mg twice a day  for seizure like activity.  Neurology plan to decrease lacosamide  dosage at future visit   Urinary Frequency Could be connected with overactive bladder Currently patient taking Myrbetriq  50 Mg, it is unclear if patient still have been taking medication Increased urination, especially in the morning, possibly medication-related Will follow-up  Follow-up Scheduled neurologist follow-up next week. Labs to be done today. - Follow up with neurologist next week. - Perform CBC, CMP, and TSH today. - Schedule follow-up with primary care on August 8th.  Non-seasonal allergic rhinitis due to other allergic trigger (Primary) - famotidine  (PEPCID ) 20 MG tablet; Take 1 tablet (20 mg total) by mouth daily.  Dispense: 30 tablet; Refill: 1 - TSH - CBC with Differential/Platelet - Comprehensive metabolic panel with GFR - Valproic  acid level  High risk medication use Taking Depakote  500 Mg twice daily Dose was increased recently due to breakthrough seizures Has been on Depakote  for seizure disorder - TSH - CBC with Differential/Platelet - Comprehensive metabolic panel with GFR - Valproic  acid level Consider A1c, lipid panel, serum prolactin, ammonia  levels, lacosamide  level for toxicity  In the follow-up unless it will be done by neurology next week  Lives in group home Per ED, expressed consideration to change group home Will communicate with PCP  No orders of the defined types were placed in this encounter.   No follow-ups on file.   The patient was advised to call back or seek an in-person evaluation if the symptoms worsen or if the condition fails to improve as anticipated.  I discussed the assessment and treatment plan with the patient. The patient was provided an opportunity to ask questions and all were answered. The patient agreed with the plan and demonstrated an understanding of the instructions.  I, Madix Blowe, PA-C have reviewed all documentation for this visit. The  documentation on 11/13/2023  for the exam, diagnosis, procedures, and orders are all accurate and complete.  Jolynn Spencer, [facility], MMS [facility] [phone] (phone) [phone] (fax)  [facility]

## 2023-11-14 ENCOUNTER — Ambulatory Visit: Payer: Self-pay | Admitting: Physician Assistant

## 2023-11-14 LAB — CBC WITH DIFFERENTIAL/PLATELET
Basophils Absolute: 0.1 x10E3/uL (ref 0.0–0.2)
Basos: 1 %
EOS (ABSOLUTE): 0.2 x10E3/uL (ref 0.0–0.4)
Eos: 3 %
Hematocrit: 40 % (ref 37.5–51.0)
Hemoglobin: 13.4 g/dL (ref 13.0–17.7)
Immature Grans (Abs): 0 x10E3/uL (ref 0.0–0.1)
Immature Granulocytes: 0 %
Lymphocytes Absolute: 2.9 x10E3/uL (ref 0.7–3.1)
Lymphs: 39 %
MCH: 31.2 pg (ref 26.6–33.0)
MCHC: 33.5 g/dL (ref 31.5–35.7)
MCV: 93 fL (ref 79–97)
Monocytes Absolute: 0.8 x10E3/uL (ref 0.1–0.9)
Monocytes: 10 %
Neutrophils Absolute: 3.6 x10E3/uL (ref 1.4–7.0)
Neutrophils: 47 %
Platelets: 269 x10E3/uL (ref 150–450)
RBC: 4.29 x10E6/uL (ref 4.14–5.80)
RDW: 12.8 % (ref 11.6–15.4)
WBC: 7.6 x10E3/uL (ref 3.4–10.8)

## 2023-11-14 LAB — COMPREHENSIVE METABOLIC PANEL WITH GFR
ALT: 12 IU/L (ref 0–44)
AST: 17 IU/L (ref 0–40)
Albumin: 4.6 g/dL (ref 4.1–5.1)
Alkaline Phosphatase: 65 IU/L (ref 44–121)
BUN/Creatinine Ratio: 12 (ref 9–20)
BUN: 13 mg/dL (ref 6–24)
Bilirubin Total: <0.2 mg/dL (ref 0.0–1.2)
CO2: 22 mmol/L (ref 20–29)
Calcium: 9.6 mg/dL (ref 8.7–10.2)
Chloride: 104 mmol/L (ref 96–106)
Creatinine, Ser: 1.09 mg/dL (ref 0.76–1.27)
Globulin, Total: 2.6 g/dL (ref 1.5–4.5)
Glucose: 83 mg/dL (ref 70–99)
Potassium: 4.5 mmol/L (ref 3.5–5.2)
Sodium: 143 mmol/L (ref 134–144)
Total Protein: 7.2 g/dL (ref 6.0–8.5)
eGFR: 88 mL/min/1.73 (ref >59)

## 2023-11-14 LAB — TSH: TSH: 2.71 u[IU]/mL (ref 0.450–4.500)

## 2023-11-14 LAB — VALPROIC ACID LEVEL: Valproic Acid Lvl: 43 ug/mL — ABNORMAL LOW (ref 50–100)

## 2023-11-14 NOTE — Progress Notes (Signed)
 All labs normal except valproic  acid below therapeutic range.  Please advise patient or his legal guardian seems Eddie Berry to contact his neurologist regarding medication adjustment

## 2023-11-15 ENCOUNTER — Encounter: Payer: Self-pay | Admitting: Physician Assistant

## 2023-11-15 DIAGNOSIS — Z79899 Other long term (current) drug therapy: Secondary | ICD-10-CM | POA: Insufficient documentation

## 2023-11-15 DIAGNOSIS — R350 Frequency of micturition: Secondary | ICD-10-CM | POA: Insufficient documentation

## 2023-11-15 DIAGNOSIS — Z789 Other specified health status: Secondary | ICD-10-CM | POA: Insufficient documentation

## 2023-11-20 ENCOUNTER — Ambulatory Visit (INDEPENDENT_AMBULATORY_CARE_PROVIDER_SITE_OTHER): Admitting: Urology

## 2023-11-20 VITALS — BP 121/83 | HR 95 | Ht 72.0 in | Wt 220.0 lb

## 2023-11-20 DIAGNOSIS — R3915 Urgency of urination: Secondary | ICD-10-CM

## 2023-11-20 DIAGNOSIS — N3941 Urge incontinence: Secondary | ICD-10-CM

## 2023-11-20 DIAGNOSIS — R350 Frequency of micturition: Secondary | ICD-10-CM

## 2023-11-20 LAB — URINALYSIS, COMPLETE
Bilirubin, UA: NEGATIVE
Glucose, UA: NEGATIVE
Leukocytes,UA: NEGATIVE
Nitrite, UA: NEGATIVE
RBC, UA: NEGATIVE
Specific Gravity, UA: 1.02 (ref 1.005–1.030)
Urobilinogen, Ur: 1 mg/dL (ref 0.2–1.0)
pH, UA: 7 (ref 5.0–7.5)

## 2023-11-20 LAB — MICROSCOPIC EXAMINATION

## 2023-11-20 MED ORDER — GEMTESA 75 MG PO TABS: 75.0000 mg | ORAL_TABLET | Freq: Every day | ORAL | Status: DC
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
## 2023-11-22 LAB — URINE CULTURE: Organism ID, Bacteria: NO GROWTH

## 2023-11-24 ENCOUNTER — Encounter: Payer: Self-pay | Admitting: Urology

## 2023-12-02 DIAGNOSIS — S069X9S Unspecified intracranial injury with loss of consciousness of unspecified duration, sequela: Secondary | ICD-10-CM | POA: Diagnosis not present

## 2023-12-03 DIAGNOSIS — J3089 Other allergic rhinitis: Secondary | ICD-10-CM | POA: Diagnosis not present

## 2023-12-04 LAB — VALPROIC ACID LEVEL: Valproic Acid Lvl: 55 ug/mL (ref 50–100)

## 2023-12-08 ENCOUNTER — Encounter: Payer: Self-pay | Admitting: Family Medicine

## 2023-12-08 ENCOUNTER — Ambulatory Visit: Admitting: Family Medicine

## 2023-12-08 VITALS — BP 111/71 | HR 95 | Ht 72.0 in | Wt 218.9 lb

## 2023-12-08 DIAGNOSIS — Z0000 Encounter for general adult medical examination without abnormal findings: Secondary | ICD-10-CM | POA: Diagnosis not present

## 2023-12-08 DIAGNOSIS — Z23 Encounter for immunization: Secondary | ICD-10-CM

## 2023-12-08 NOTE — Progress Notes (Signed)
 Annual Wellness Visit     Patient: Eddie Berry, Male    DOB: 14-Oct-1985, 38 y.o.   MRN: [id number] Visit Date: 12/08/2023  Today's Provider: LAURAINE LOISE BUOY, DO   Chief Complaint  Patient presents with   Annual Exam    Diet -  trying to do away with fried food, porks but no specific diet due to concerns of weight. No goal set at this time Exercise - walking day for about 15 minutes, working on increasing Feeling - well Sleeping - well Concerns - Patient would like to stop wearing depends but PCA reports he needs them and just seems embarrassed to wear them and would need support encouraging patient to continue to wear them    Documentation    Healthcare Appointment Summary form provided to be completed today.   Subjective    Eddie Berry is a 38 y.o. male who presents today for his Annual Wellness Visit.  HPI Eddie Berry is a 38 year old male who presents for a follow-up visit and pneumonia vaccination.  He experiences intermittent coughing throughout the day, described as 'coughing like crazy', sometimes accompanied by excessive saliva. He uses chloraseptic and cough drops for symptom relief.  He is on famotidine  and omeprazole for reflux, which he feels are effective. No unusual symptoms have been noted with these medications.  He experiences nocturnal enuresis approximately once every nine to ten days and uses Depends at night. He is taking Gemtesa  to manage urine output.  He has a history of hypothyroidism and takes levothyroxine  25 mcg daily. His thyroid  levels were last checked in last month (11/13/23), with no recent changes to his medication.  No recent seizures and no alcohol  use. He has a history of an allergic reaction to a medication but not to vaccines.     Medications: Outpatient Medications Prior to Visit  Medication Sig   acetaminophen  (TYLENOL ) 500 MG tablet Take 1 tablet (500 mg total) by mouth every 8 (eight) hours as needed for  mild pain (pain score 1-3), moderate pain (pain score 4-6), fever or headache.   albuterol  (VENTOLIN  HFA) 108 (90 Base) MCG/ACT inhaler Inhale 2 puffs into the lungs every 6 (six) hours as needed for wheezing or shortness of breath.   cetirizine (ZYRTEC) 10 MG tablet Take 10 mg by mouth daily.   Cholecalciferol  (VITAMIN D3) 50 MCG (2000 UT) TABS TAKE 1 TABLET BY MOUTH EVERY MORNING FOR VIT DAILY INSUFFICIENCY *TAKE WITH FOOD*   cloZAPine  (CLOZARIL ) 100 MG tablet Take 3 tablets (300 mg total) by mouth daily. Take for total 300 mg once daily   divalproex  (DEPAKOTE ) 250 MG DR tablet Take one tablet every night along with the 500mg  divalproex    divalproex  (DEPAKOTE ) 500 MG DR tablet Take 1 tablet (500 mg total) by mouth 2 (two) times daily.   docusate sodium  (COLACE) 100 MG capsule Take 1 capsule (100 mg total) by mouth daily.   famotidine  (PEPCID ) 20 MG tablet Take 1 tablet (20 mg total) by mouth daily.   fenofibrate  (TRICOR ) 48 MG tablet TAKE 1 TABLET BY MOUTH EVERY MORNING *DO NOT CRUSH OR CHEW*   hydroxypropyl methylcellulose / hypromellose (ISOPTO TEARS / GONIOVISC) 2.5 % ophthalmic solution Place 2 drops into both eyes 2 (two) times daily.   hydrOXYzine  (ATARAX ) 50 MG tablet Take 25-50 mg by mouth 2 (two) times daily as needed for anxiety.   ibuprofen (ADVIL) 400 MG tablet Take 1.5 tablets (600 mg total) by mouth every  8 (eight) hours as needed.   ipratropium-albuterol  (DUONEB) 0.5-2.5 (3) MG/3ML SOLN Take 3 mLs by nebulization every 6 (six) hours as needed.   lacosamide  (VIMPAT ) 200 MG TABS tablet Take 1 tablet (200 mg total) by mouth 2 (two) times daily.   levothyroxine  (SYNTHROID ) 25 MCG tablet TAKE 1 TABLET BY MOUTH ONCE DAILY *TAKE ON AN EMPTY STOMACH*   Melatonin 10 MG TABS Take 10 mg by mouth at bedtime as needed (Sleep).   Multiple Vitamin (THEREMS PO) Take 1 tablet by mouth daily.   Multiple Vitamin (THEREMS) TABS TAKE 1 TABLET BY MOUTH EVERY MORNING FOR NUTRITIONAL SUPPLEMENT    omeprazole (PRILOSEC) 20 MG capsule Take 20 mg by mouth daily.   paliperidone  (INVEGA ) 6 MG 24 hr tablet Take 12 mg by mouth every morning.   propranolol  (INDERAL ) 10 MG tablet Take 10 mg by mouth daily.   sertraline  (ZOLOFT ) 100 MG tablet Take 150 mg by mouth at bedtime.   THERATEARS NIGHTTIME 1 % GEL PLACE 2 DROPS INTO BOTH EYES TWICE DAILY *WAIT 3-5 MINUTES BETWEEN 2 EYE MEDS*   Vibegron  (GEMTESA ) 75 MG TABS Take 1 tablet (75 mg total) by mouth daily.   zonisamide  (ZONEGRAN ) 50 MG capsule Take 1 capsule (50 mg total) by mouth daily.   No facility-administered medications prior to visit.    Allergies  Allergen Reactions   Antihistamines, Diphenhydramine-Type Other (See Comments)    UNK reaction   Nsaids Other (See Comments)    Unk reaction   Prednisone Other (See Comments)    UNK reaction   Aspirin Rash   Penicillins Rash    Patient Care Team: Camila Norville N, DO as PCP - General (Family Medicine)        Objective    Vitals: BP 111/71 (Cuff Size: Normal)   Pulse 95   Ht 6' (1.829 m)   Wt 218 lb 14.4 oz (99.3 kg)   SpO2 96%   BMI 29.69 kg/m      Physical Exam Vitals and nursing note reviewed.  Constitutional:      General: He is not in acute distress.    Appearance: Normal appearance.  HENT:     Head: Normocephalic and atraumatic.  Eyes:     General: No scleral icterus.    Conjunctiva/sclera: Conjunctivae normal.  Cardiovascular:     Rate and Rhythm: Normal rate.  Pulmonary:     Effort: Pulmonary effort is normal.  Neurological:     Mental Status: He is alert and oriented to person, place, and time. Mental status is at baseline.  Psychiatric:        Mood and Affect: Mood normal.        Behavior: Behavior normal.     Most recent functional status assessment:    11/03/2023    9:32 AM  In your present state of health, do you have any difficulty performing the following activities:  Hearing? 0  Vision? 1  Difficulty concentrating or making decisions?  1  Walking or climbing stairs? 0  Dressing or bathing? 0  Doing errands, shopping? 0  Preparing Food and eating ? N  Using the Toilet? N  In the past six months, have you accidently leaked urine? N  Do you have problems with loss of bowel control? N  Managing your Medications? Y  Managing your Finances? Y  Housekeeping or managing your Housekeeping? Y   Most recent fall risk assessment:    12/08/2023    2:29 PM  Fall Risk  Falls in the past year? 0  Number falls in past yr: 1  Injury with Fall? 0  Risk for fall due to : No Fall Risks;History of fall(s)  Follow up Falls evaluation completed    Most recent depression screenings:    12/08/2023    2:30 PM 11/03/2023   10:21 AM  PHQ 2/9 Scores  PHQ - 2 Score 5 6  PHQ- 9 Score 20 24   Most recent cognitive screening:    12/08/2023    2:49 PM  6CIT Screen  What Year? 0 points  What month? 0 points  What time? 0 points  Count back from 20 0 points  Months in reverse 0 points  Repeat phrase 6 points  Total Score 6 points   Most recent Audit-C alcohol  use screening    12/08/2023    2:49 PM  Alcohol  Use Disorder Test (AUDIT)  1. How often do you have a drink containing alcohol ? 0  2. How many drinks containing alcohol  do you have on a typical day when you are drinking? 0  3. How often do you have six or more drinks on one occasion? 0  AUDIT-C Score 0   A score of 3 or more in women, and 4 or more in men indicates increased risk for alcohol  abuse, EXCEPT if all of the points are from question 1   No results found for any visits on 12/08/23.  Assessment & Plan     Annual wellness visit done today including the all of the following: Reviewed patient's Family Medical History Reviewed and updated list of patient's medical providers Assessment of cognitive impairment was done Assessed patient's functional ability Established a written schedule for health screening services Health Risk Assessent Completed and  Reviewed Patient is not currently on any opioid medications.   Exercise Activities and Dietary recommendations  Goals   None     Immunization History  Administered Date(s) Administered   Dtap, Unspecified 10/09/1983, 01/08/1984, 08/19/1984, 04/15/1986   Fluzone Influenza virus vaccine,trivalent (IIV3), split virus 04/12/2009   HIB, Unspecified 03/15/1987   Influenza, Seasonal, Injecte, Preservative Fre 03/16/2013   Influenza,inj,Quad PF,6+ Mos 03/02/2014, 02/22/2015, 03/03/2017, 03/03/2018, 03/09/2019   Influenza,inj,quad, With Preservative 02/05/2016   MMR 04/15/1986, 09/25/1994   Moderna Sars-Covid-2 Vaccination 08/20/2019, 09/30/2019   Polio, Unspecified 10/09/1983, 08/19/1984, 04/15/1986   Tdap 08/11/2023    Health Maintenance  Topic Date Due   Pneumococcal Vaccine: 19-49 Years (1 of 2 - PCV) Never done   Hepatitis B Vaccines (1 of 3 - 19+ 3-dose series) Never done   HPV VACCINES (1 - 3-dose SCDM series) Never done   COVID-19 Vaccine (3 - Moderna risk series) 02/04/2024 (Originally 10/28/2019)   INFLUENZA VACCINE  08/03/2024 (Originally 12/05/2023)   Hepatitis C Screening  08/03/2024 (Originally 08/07/2001)   Medicare Annual Wellness (AWV)  12/07/2024   DTaP/Tdap/Td (6 - Td or Tdap) 08/10/2033   HIV Screening  Completed   Meningococcal B Vaccine  Aged Out     Discussed health benefits of physical activity, and encouraged him to engage in regular exercise appropriate for his age and condition.    Medicare annual wellness visit, subsequent  Encounter for Prevnar pneumococcal vaccination -     Pneumococcal conjugate vaccine 20-valent      Urinary incontinence Occurs primarily at night. Using Depends and Gemtesa  to control frequency. Evaluating Gemtesa  effectiveness with a sample. - Continue Depends at night. - Continue Gemtesa , monitor effectiveness.  Follows with urology; defer to specialist management - Reduce  liquid intake to assess true medication  impact.  Gastroesophageal reflux disease (GERD) Managed with famotidine  and omeprazole. Famotidine  effective, no unusual symptoms. - Continue famotidine . - Continue omeprazole. - No changes today.  Cough Intermittent, possibly related to allergies, dry air, or reflux. Symptom relief with chloraseptic and cough drops. - Continue chloraseptic and cough drops as needed.  Overweight Weight loss progress noted, currently 218 pounds from 230 pounds. Actively working on weight loss and maintaining an active lifestyle.  Hypothyroidism Managed with levothyroxine  25 mcg. Thyroid  levels well-managed as of July. - Continue levothyroxine  25 mcg.    Return in about 6 months (around 06/09/2024) for Chronic f/u.     I discussed the assessment and treatment plan with the patient  The patient was provided an opportunity to ask questions and all were answered. The patient agreed with the plan and demonstrated an understanding of the instructions.   The patient was advised to call back or seek an in-person evaluation if the symptoms worsen or if the condition fails to improve as anticipated.    LAURAINE LOISE BUOY, DO  [facility] [phone] (phone) [phone] (fax)  [facility]

## 2023-12-09 ENCOUNTER — Other Ambulatory Visit: Payer: Self-pay

## 2023-12-09 DIAGNOSIS — R350 Frequency of micturition: Secondary | ICD-10-CM

## 2023-12-09 MED ORDER — GEMTESA 75 MG PO TABS: 75.0000 mg | ORAL_TABLET | Freq: Every day | ORAL | 2 refills | Status: AC

## 2023-12-16 ENCOUNTER — Other Ambulatory Visit: Payer: Self-pay | Admitting: Family Medicine

## 2023-12-17 ENCOUNTER — Other Ambulatory Visit: Payer: Self-pay | Admitting: Family Medicine

## 2023-12-17 ENCOUNTER — Other Ambulatory Visit: Payer: Self-pay | Admitting: Physician Assistant

## 2023-12-17 DIAGNOSIS — J3089 Other allergic rhinitis: Secondary | ICD-10-CM

## 2023-12-17 DIAGNOSIS — F39 Unspecified mood [affective] disorder: Secondary | ICD-10-CM

## 2023-12-20 ENCOUNTER — Emergency Department
Admission: EM | Admit: 2023-12-20 | Discharge: 2023-12-20 | Disposition: A | Discharge status: Home / Self Care | Attending: Emergency Medicine | Admitting: Emergency Medicine

## 2023-12-20 ENCOUNTER — Emergency Department

## 2023-12-20 ENCOUNTER — Other Ambulatory Visit: Payer: Self-pay

## 2023-12-20 DIAGNOSIS — R059 Cough, unspecified: Secondary | ICD-10-CM | POA: Diagnosis not present

## 2023-12-20 DIAGNOSIS — R270 Ataxia, unspecified: Secondary | ICD-10-CM | POA: Diagnosis not present

## 2023-12-20 DIAGNOSIS — R569 Unspecified convulsions: Secondary | ICD-10-CM | POA: Insufficient documentation

## 2023-12-20 DIAGNOSIS — G9389 Other specified disorders of brain: Secondary | ICD-10-CM | POA: Diagnosis not present

## 2023-12-20 DIAGNOSIS — Z743 Need for continuous supervision: Secondary | ICD-10-CM | POA: Diagnosis not present

## 2023-12-20 LAB — CBC WITH DIFFERENTIAL/PLATELET
Abs Immature Granulocytes: 0.03 K/uL (ref 0.00–0.07)
Basophils Absolute: 0 K/uL (ref 0.0–0.1)
Basophils Relative: 1 %
Eosinophils Absolute: 0.2 K/uL (ref 0.0–0.5)
Eosinophils Relative: 2 %
HCT: 39.3 % (ref 39.0–52.0)
Hemoglobin: 13.2 g/dL (ref 13.0–17.0)
Immature Granulocytes: 0 %
Lymphocytes Relative: 29 %
Lymphs Abs: 2.4 K/uL (ref 0.7–4.0)
MCH: 31.4 pg (ref 26.0–34.0)
MCHC: 33.6 g/dL (ref 30.0–36.0)
MCV: 93.3 fL (ref 80.0–100.0)
Monocytes Absolute: 0.6 K/uL (ref 0.1–1.0)
Monocytes Relative: 7 %
Neutro Abs: 5 K/uL (ref 1.7–7.7)
Neutrophils Relative %: 61 %
Platelets: 302 K/uL (ref 150–400)
RBC: 4.21 MIL/uL — ABNORMAL LOW (ref 4.22–5.81)
RDW: 12.7 % (ref 11.5–15.5)
WBC: 8.2 K/uL (ref 4.0–10.5)
nRBC: 0 % (ref 0.0–0.2)

## 2023-12-20 LAB — BASIC METABOLIC PANEL WITH GFR
Anion gap: 11 (ref 5–15)
BUN: 13 mg/dL (ref 6–20)
CO2: 21 mmol/L — ABNORMAL LOW (ref 22–32)
Calcium: 9 mg/dL (ref 8.9–10.3)
Chloride: 106 mmol/L (ref 98–111)
Creatinine, Ser: 1.04 mg/dL (ref 0.61–1.24)
GFR, Estimated: >60 mL/min (ref >60)
Glucose, Bld: 178 mg/dL — ABNORMAL HIGH (ref 70–99)
Potassium: 3.5 mmol/L (ref 3.5–5.1)
Sodium: 138 mmol/L (ref 135–145)

## 2023-12-20 LAB — URINALYSIS, W/ REFLEX TO CULTURE (INFECTION SUSPECTED)
Bacteria, UA: NONE SEEN
Bilirubin Urine: NEGATIVE
Glucose, UA: NEGATIVE mg/dL
Hgb urine dipstick: NEGATIVE
Ketones, ur: NEGATIVE mg/dL
Leukocytes,Ua: NEGATIVE
Nitrite: NEGATIVE
Protein, ur: NEGATIVE mg/dL
Specific Gravity, Urine: 1.006 (ref 1.005–1.030)
pH: 6 (ref 5.0–8.0)

## 2023-12-20 LAB — TROPONIN I (HIGH SENSITIVITY): Troponin I (High Sensitivity): <2 ng/L (ref <18)

## 2023-12-20 LAB — CK: Total CK: 77 U/L (ref 49–397)

## 2023-12-20 MED ORDER — SODIUM CHLORIDE 0.9 % IV BOLUS
1000.0000 mL | Freq: Once | INTRAVENOUS | Status: AC
  Administered 2023-12-20: 1000 mL via INTRAVENOUS

## 2023-12-20 NOTE — ED Notes (Addendum)
 Pt AOX4, able to stand. Reports slight dizziness with standing.

## 2023-12-20 NOTE — ED Notes (Signed)
 Seizure pads applied.

## 2023-12-20 NOTE — ED Notes (Signed)
 Pt now speaking, Aox4. Pt states he blacked out.

## 2023-12-20 NOTE — ED Notes (Signed)
 Pt in CT.

## 2023-12-20 NOTE — ED Notes (Signed)
 Pt stands and ambulates by bed without difficulty but states I'm gonna fall.

## 2023-12-20 NOTE — ED Triage Notes (Signed)
 Pt had seizure on the way back to group home after lunch. Pt alert but not answering questions. Pt was not seizing on ems arrival- 1215 seizure witnessed by staff. Pt has history of seizures and took meds today. Pt following commands but not answering questions.  191 bgl 20 gauge right hand 94 hr 95% RA 115/74

## 2023-12-20 NOTE — ED Provider Notes (Signed)
.-----------------------------------------   3:51 PM on 12/20/2023 -----------------------------------------  Blood pressure 94/78, pulse 99, temperature (!) 97.4 F (36.3 C), temperature source Oral, resp. rate 18, SpO2 100%.  Assuming care from Dr. Suzanne.  In short, Eddie Berry is a 38 y.o. male with a chief complaint of Seizures .  Refer to the original H&P for additional details.  The current plan of care is to follow up CT imaging, CXR, reassess after IVF, if improved and imaging without acute findings can dc back to facility.  Patient was observed in the emergency department, no lightheadedness or dizziness, was able to get him up to a stand and ambulate without difficulty.  Discussed with caregiver and patient, considered but no indication for inpatient admission at this time, he safe for outpatient management.  Will discharge with strict precautions.  Encouraged hydration since patient states that he had not been drinking much water recently.  Instructed them to have him follow his primary care doctor next week to get reassessed.  Strict return precautions given.  Discharge.  Clinical Course as of 12/20/23 1858  Sat Dec 20, 2023  1545 Signed out pending CT imaging. Had a seizure at facility, has h/o non-epileptic seizure. Back to his baseline now. Pt fell in the ED, was orthostatic, pending CT head/neck, CXR, IVF, if ok, can dc back to facility [TT]  1637 DG Chest Portable 1 View 1. No acute intrathoracic process.  [TT]  1651 CT Head Wo Contrast IMPRESSION: 1. No evidence of acute intracranial abnormality or cervical spine fracture. 2. Unchanged multifocal encephalomalacia.   [TT]  1651 Independent review of labs, no leukocytosis, electrolytes not severely deranged, CK is normal, UA is normal. [TT]    Clinical Course User Index [TT] Waymond Lorelle Cummins, MD      Waymond Lorelle Cummins, MD 12/20/23 [phone]

## 2023-12-20 NOTE — Discharge Instructions (Signed)
 You were seen in the emergency department following a questionable seizure.  You returned back to your normal mental status.  Your lab work and electrolytes were overall normal.  You did not have any findings of a urinary tract infection.  It is important that you follow-up with your neurologist as an outpatient.  Return to the emergency department if you have any return or worsening symptoms or develop any new symptoms that are concerning.

## 2023-12-20 NOTE — ED Provider Notes (Signed)
 [facility] Provider Note    Event Date/Time   First MD Initiated Contact with Patient 12/20/23 1301     (approximate)   History   Seizures   HPI  Eddie Berry is a 38 y.o. male past medical history significant for TBI, history of seizure-like activity, presents to the emergency department with concern for possible seizure-like activity.  Patient notes that a group home and has had a history of seizure-like activity in the past.  EMS stated that they were called out for seizure-like activity and it had taken him longer to respond than he normally would.  Pulled aside by caregiver who stated that the patient does not have a history of seizure and states that after multiple evaluations with neurology he has had a history of nonepileptic seizure.  States that he often times is seen in the emergency department and likes coffee and soda.     Physical Exam   Triage Vital Signs: ED Triage Vitals  Encounter Vitals Group     BP 12/20/23 1308 109/80     Girls Systolic BP Percentile --      Girls Diastolic BP Percentile --      Boys Systolic BP Percentile --      Boys Diastolic BP Percentile --      Pulse Rate 12/20/23 1308 95     Resp 12/20/23 1308 (!) 28     Temp 12/20/23 1311 98 F (36.7 C)     Temp Source 12/20/23 1311 Oral     SpO2 12/20/23 1308 97 %     Weight --      Height --      Head Circumference --      Peak Flow --      Pain Score --      Pain Loc --      Pain Education --      Exclude from Growth Chart --     Most recent vital signs: Vitals:   12/20/23 1510 12/20/23 1515  BP:  94/78  Pulse:  99  Resp:    Temp: (!) 97.4 F (36.3 C)   SpO2:  100%    Physical Exam Constitutional:      Appearance: He is well-developed.  HENT:     Head: Atraumatic.  Eyes:     Conjunctiva/sclera: Conjunctivae normal.  Cardiovascular:     Rate and Rhythm: Regular rhythm.  Pulmonary:     Effort: No respiratory distress.  Abdominal:      Tenderness: There is no abdominal tenderness.  Musculoskeletal:        General: Normal range of motion.     Cervical back: Normal range of motion.     Right lower leg: No edema.     Left lower leg: No edema.  Skin:    General: Skin is warm.     Capillary Refill: Capillary refill takes less than 2 seconds.  Neurological:     Mental Status: He is alert. Mental status is at baseline.     IMPRESSION / MDM / ASSESSMENT AND PLAN / ED COURSE  I reviewed the triage vital signs and the nursing notes.  Differential diagnosis including electrolyte abnormality, seizure, dehydration, urinary tract infection, nonepileptic seizure  Patient initially was not responding to questions, when I discussed we may need to do In-N-Out catheterization to get a urine he started immediately responding.  Following commands.  Moving all extremities equally.  Alert and oriented.  And has a nonfocal neurologic  exam.  EKG  I, Clotilda Punter, the attending physician, personally viewed and interpreted this ECG.   Rate: Normal  Rhythm: Normal sinus  Axis: Normal  Intervals: Normal  ST&T Change: None  No tachycardic or bradycardic dysrhythmias while on cardiac telemetry.  RADIOLOGY CT scan of the head, cervical spine and chest x-ray pending LABS (all labs ordered are listed, but only abnormal results are displayed) Labs interpreted as -    Labs Reviewed  CBC WITH DIFFERENTIAL/PLATELET - Abnormal; Notable for the following components:      Result Value   RBC 4.21 (*)    All other components within normal limits  BASIC METABOLIC PANEL WITH GFR - Abnormal; Notable for the following components:   CO2 21 (*)    Glucose, Bld 178 (*)    All other components within normal limits  URINALYSIS, W/ REFLEX TO CULTURE (INFECTION SUSPECTED) - Abnormal; Notable for the following components:   Color, Urine YELLOW (*)    APPearance HAZY (*)    All other components within normal limits  CK     MDM    Lab work  with no leukocytosis.  No significant anemia.  Hyperglycemia but glucose is only elevated at 178.  No significant electrolyte abnormality.  Normal anion gap.  No signs of urinary tract infection.  Patient back to mental status baseline.  No signs of an electrolyte abnormality.  No signs of dehydration.  No findings concerning for urinary tract infection.  No meningismus on exam.  Patient was discharged to go back to his group home.  He was walking and ambulating in the emergency department and then had a fall.  States that he felt dizzy and lightheaded.  Orthostatic blood pressures were positive.  Will give 1 L of IV fluids.  Will do a CT scan of his head and cervical spine and chest x-ray and add on a CK.  If those are reassuring and able to ambulate will still have a plan to discharge home otherwise will need admission.   PROCEDURES:  Critical Care performed: No  Procedures  Patient's presentation is most consistent with acute presentation with potential threat to life or bodily function.   MEDICATIONS ORDERED IN ED: Medications  sodium chloride  0.9 % bolus 1,000 mL (has no administration in time range)    FINAL CLINICAL IMPRESSION(S) / ED DIAGNOSES   Final diagnoses:  Seizure-like activity (HCC)     Rx / DC Orders   ED Discharge Orders     None        Note:  This document was prepared using Dragon voice recognition software and may include unintentional dictation errors.   Punter Clotilda, MD 12/20/23 [phone]

## 2023-12-20 NOTE — ED Notes (Signed)
 Called lab to add on troponin

## 2023-12-23 DIAGNOSIS — R413 Other amnesia: Secondary | ICD-10-CM | POA: Diagnosis not present

## 2023-12-23 DIAGNOSIS — S069X0D Unspecified intracranial injury without loss of consciousness, subsequent encounter: Secondary | ICD-10-CM | POA: Diagnosis not present

## 2023-12-23 DIAGNOSIS — E559 Vitamin D deficiency, unspecified: Secondary | ICD-10-CM | POA: Diagnosis not present

## 2023-12-23 DIAGNOSIS — Z87820 Personal history of traumatic brain injury: Secondary | ICD-10-CM | POA: Diagnosis not present

## 2023-12-23 DIAGNOSIS — R5383 Other fatigue: Secondary | ICD-10-CM | POA: Diagnosis not present

## 2023-12-23 DIAGNOSIS — R631 Polydipsia: Secondary | ICD-10-CM | POA: Diagnosis not present

## 2023-12-23 DIAGNOSIS — R569 Unspecified convulsions: Secondary | ICD-10-CM | POA: Diagnosis not present

## 2023-12-23 DIAGNOSIS — E538 Deficiency of other specified B group vitamins: Secondary | ICD-10-CM | POA: Diagnosis not present

## 2023-12-23 DIAGNOSIS — R2689 Other abnormalities of gait and mobility: Secondary | ICD-10-CM | POA: Diagnosis not present

## 2023-12-23 DIAGNOSIS — Z8673 Personal history of transient ischemic attack (TIA), and cerebral infarction without residual deficits: Secondary | ICD-10-CM | POA: Diagnosis not present

## 2023-12-23 NOTE — Progress Notes (Unsigned)
 12/24/2023 4:55 PM   Eddie Berry 1986/03/21 [id number]  Referring provider: Donzella Lauraine SAILOR, DO [address] [place],  [place] [address]  Urological history: 1. LU TS - Failed Myrbetriq   No chief complaint on file.  HPI: Eddie Berry is a 38 y.o. man who presents today for 1 month follow-up after a trial of Gemtesa  75 mg daily  Previous records reviewed.   UA ***   Lab Results  Component Value Date   WBC 8.2 12/20/2023   HGB 13.2 12/20/2023   HCT 39.3 12/20/2023   MCV 93.3 12/20/2023   PLT 302 12/20/2023   Lab Results  Component Value Date   CREATININE 1.04 12/20/2023   Lab Results  Component Value Date   HGBA1C 5.6 05/16/2022       PMH: Past Medical History:  Diagnosis Date   Allergy    Asthma    Autism    Blood transfusion without reported diagnosis    Seizures (HCC)    TBI (traumatic brain injury) (HCC)     Surgical History: Past Surgical History:  Procedure Laterality Date   ANKLE SURGERY     APPENDECTOMY     BRAIN SURGERY     LUNG SURGERY      Home Medications:  Allergies as of 12/24/2023       Reactions   Antihistamines, Diphenhydramine-type Other (See Comments)   UNK reaction   Nsaids Other (See Comments)   Unk reaction   Prednisone Other (See Comments)   UNK reaction   Aspirin Rash   Penicillins Rash        Medication List        Accurate as of December 23, 2023  4:55 PM. If you have any questions, ask your nurse or doctor.          acetaminophen  500 MG tablet Commonly known as: TYLENOL  Take 1 tablet (500 mg total) by mouth every 8 (eight) hours as needed for mild pain (pain score 1-3), moderate pain (pain score 4-6), fever or headache.   albuterol  108 (90 Base) MCG/ACT inhaler Commonly known as: VENTOLIN  HFA Inhale 2 puffs into the lungs every 6 (six) hours as needed for wheezing or shortness of breath.   cetirizine 10 MG tablet Commonly known as: ZYRTEC Take 10 mg by mouth daily.    cloZAPine  100 MG tablet Commonly known as: CLOZARIL  Take 3 tablets (300 mg total) by mouth daily. Take for total 300 mg once daily   cyanocobalamin  1000 MCG tablet Commonly known as: VITAMIN B12 TAKE 1 TABLET BY MOUTH ONCE DAILY   divalproex  500 MG DR tablet Commonly known as: DEPAKOTE  Take 1 tablet (500 mg total) by mouth 2 (two) times daily.   divalproex  250 MG DR tablet Commonly known as: DEPAKOTE  Take one tablet every night along with the 500mg  divalproex    docusate sodium  100 MG capsule Commonly known as: Colace Take 1 capsule (100 mg total) by mouth daily.   famotidine  20 MG tablet Commonly known as: PEPCID  TAKE 1 TABLET BY MOUTH ONCE DAILY   fenofibrate  48 MG tablet Commonly known as: TRICOR  TAKE 1 TABLET BY MOUTH EVERY MORNING *DO NOT CRUSH OR CHEW*   Gemtesa  75 MG Tabs Generic drug: Vibegron  Take 1 tablet (75 mg total) by mouth daily.   hydroxypropyl methylcellulose / hypromellose 2.5 % ophthalmic solution Commonly known as: ISOPTO TEARS / GONIOVISC Place 2 drops into both eyes 2 (two) times daily.   hydrOXYzine  50 MG tablet Commonly known  as: ATARAX  Take 25-50 mg by mouth 2 (two) times daily as needed for anxiety.   ibuprofen 400 MG tablet Commonly known as: ADVIL Take 1.5 tablets (600 mg total) by mouth every 8 (eight) hours as needed.   ipratropium-albuterol  0.5-2.5 (3) MG/3ML Soln Commonly known as: DUONEB Take 3 mLs by nebulization every 6 (six) hours as needed.   lacosamide  200 MG Tabs tablet Commonly known as: VIMPAT  Take 1 tablet (200 mg total) by mouth 2 (two) times daily.   levothyroxine  25 MCG tablet Commonly known as: SYNTHROID  TAKE 1 TABLET BY MOUTH ONCE DAILY *TAKE ON AN EMPTY STOMACH*   Melatonin 10 MG Tabs Take 10 mg by mouth at bedtime as needed (Sleep).   omeprazole 20 MG capsule Commonly known as: PRILOSEC Take 20 mg by mouth daily.   paliperidone  6 MG 24 hr tablet Commonly known as: INVEGA  Take 12 mg by mouth every  morning.   propranolol  10 MG tablet Commonly known as: INDERAL  TAKE 1 TABLET BY MOUTH ONCE DAILY   sertraline  100 MG tablet Commonly known as: ZOLOFT  Take 150 mg by mouth at bedtime.   TheraTears Nighttime 1 % Gel Generic drug: Carboxymethylcellulose Sod PF PLACE 2 DROPS INTO BOTH EYES TWICE DAILY *WAIT 3-5 MINUTES BETWEEN 2 EYE MEDS*   THEREMS PO Take 1 tablet by mouth daily.   Therems Tabs TAKE 1 TABLET BY MOUTH EVERY MORNING FOR NUTRITIONAL SUPPLEMENT   Vitamin D3 50 MCG (2000 UT) Tabs TAKE 1 TABLET BY MOUTH EVERY MORNING FOR VIT DAILY INSUFFICIENCY *TAKE WITH FOOD*   zonisamide  50 MG capsule Commonly known as: ZONEGRAN  Take 1 capsule (50 mg total) by mouth daily.        Allergies:  Allergies  Allergen Reactions   Antihistamines, Diphenhydramine-Type Other (See Comments)    UNK reaction   Nsaids Other (See Comments)    Unk reaction   Prednisone Other (See Comments)    UNK reaction   Aspirin Rash   Penicillins Rash    Family History: No family history on file.  Social History: See HPI for pertinent social history  ROS: Pertinent ROS in HPI  Physical Exam: There were no vitals taken for this visit.  Constitutional:  Well nourished. Alert and oriented, No acute distress. HEENT: [place] AT, moist mucus membranes.  Trachea midline, no masses. Cardiovascular: No clubbing, cyanosis, or edema. Respiratory: Normal respiratory effort, no increased work of breathing. GI: Abdomen is soft, non tender, non distended, no abdominal masses. Liver and spleen not palpable.  No hernias appreciated.  Stool sample for occult testing is not indicated.   GU: No CVA tenderness.  No bladder fullness or masses.  Patient with circumcised/uncircumcised phallus. ***Foreskin easily retracted***  Urethral meatus is patent.  No penile discharge. No penile lesions or rashes. Scrotum without lesions, cysts, rashes and/or edema.  Testicles are located scrotally bilaterally. No masses are  appreciated in the testicles. Left and right epididymis are normal. Rectal: Patient with  normal sphincter tone. Anus and perineum without scarring or rashes. No rectal masses are appreciated. Prostate is approximately *** grams, *** nodules are appreciated. Seminal vesicles are normal. Skin: No rashes, bruises or suspicious lesions. Lymph: No cervical or inguinal adenopathy. Neurologic: Grossly intact, no focal deficits, moving all 4 extremities. Psychiatric: Normal mood and affect.  Laboratory Data: See EPIC and HPI  I have reviewed the labs.   Pertinent Imaging:  Assessment & Plan:  *** ***  1. Urge incontinence (Primary) - PVR ***   No follow-ups on  file.  These notes generated with voice recognition software. I apologize for typographical errors.  CLOTILDA HELON RIGGERS  [facility] [address]  [address] [place], [place] [address] [phone]

## 2023-12-24 ENCOUNTER — Encounter: Payer: Self-pay | Admitting: Urology

## 2023-12-24 ENCOUNTER — Ambulatory Visit (INDEPENDENT_AMBULATORY_CARE_PROVIDER_SITE_OTHER): Admitting: Urology

## 2023-12-24 VITALS — BP 106/77 | HR 109 | Ht 72.0 in | Wt 218.0 lb

## 2023-12-24 DIAGNOSIS — N3941 Urge incontinence: Secondary | ICD-10-CM

## 2023-12-24 LAB — URINALYSIS, COMPLETE
Bilirubin, UA: NEGATIVE
Glucose, UA: NEGATIVE
Ketones, UA: NEGATIVE
Leukocytes,UA: NEGATIVE
Nitrite, UA: NEGATIVE
Protein,UA: NEGATIVE
RBC, UA: NEGATIVE
Specific Gravity, UA: 1.01 (ref 1.005–1.030)
Urobilinogen, Ur: 1 mg/dL (ref 0.2–1.0)
pH, UA: 8 — ABNORMAL HIGH (ref 5.0–7.5)

## 2023-12-24 LAB — MICROSCOPIC EXAMINATION: Bacteria, UA: NONE SEEN

## 2023-12-24 LAB — BLADDER SCAN AMB NON-IMAGING

## 2023-12-24 MED ORDER — GEMTESA 75 MG PO TABS: 75.0000 mg | ORAL_TABLET | Freq: Every day | ORAL | 6 refills | Status: DC

## 2023-12-31 DIAGNOSIS — S069X9S Unspecified intracranial injury with loss of consciousness of unspecified duration, sequela: Secondary | ICD-10-CM | POA: Diagnosis not present

## 2024-01-12 ENCOUNTER — Other Ambulatory Visit: Payer: Self-pay | Admitting: Family Medicine

## 2024-01-20 ENCOUNTER — Encounter: Payer: Self-pay | Admitting: Family Medicine

## 2024-01-20 ENCOUNTER — Ambulatory Visit: Admitting: Family Medicine

## 2024-01-20 VITALS — BP 94/67 | HR 91 | Resp 16 | Ht 72.0 in | Wt 221.1 lb

## 2024-01-20 DIAGNOSIS — Z789 Other specified health status: Secondary | ICD-10-CM

## 2024-01-20 DIAGNOSIS — Z87820 Personal history of traumatic brain injury: Secondary | ICD-10-CM

## 2024-01-20 DIAGNOSIS — R569 Unspecified convulsions: Secondary | ICD-10-CM | POA: Diagnosis not present

## 2024-01-20 DIAGNOSIS — N3941 Urge incontinence: Secondary | ICD-10-CM | POA: Diagnosis not present

## 2024-01-20 DIAGNOSIS — Z23 Encounter for immunization: Secondary | ICD-10-CM

## 2024-01-20 DIAGNOSIS — Z79899 Other long term (current) drug therapy: Secondary | ICD-10-CM

## 2024-01-20 NOTE — Progress Notes (Signed)
 Established patient visit   Patient: Eddie Berry   DOB: April 23, 1986   38 y.o. Male  MRN: [id number] Visit Date: 01/20/2024  Today's healthcare provider: LAURAINE LOISE BUOY, DO   Chief Complaint  Patient presents with   Follow-up    Needs a FL2 form    Subjective    HPI Criag Wicklund is a 38 year old male who presents for medication management and evaluation of urinary symptoms. He is accompanied by his caregiver, [name] Sharps, who is the house manager at his group home.  He is currently on multiple medications including lacosamide  for seizures. He is also on Depakote , which was previously increased to 500 mg three times a day, but this dosage made him non-functional, so it was adjusted back down to 500 mg twice daily.  For urinary symptoms, he is taking Gemtesa . There is some confusion about whether Myrbetriq  should be discontinued, as it is not on the current medication list in the urology notes, but he appears to still be receiving it based on the medication list provided by Mr. Sharps.  He is on medication for drooling, which his caregiver believes starts with a 'B'. His current medication list includes omeprazole, cetirizine, vitamin E oil for his scalp, sertraline , ibuprofen, Colace, hypotropium, albuterol , melatonin, and hydroxyzine , which he takes both scheduled and as needed.  He experiences issues with continence, reporting both urinary and fecal incontinence, particularly after consuming green vegetables. His caregiver notes that he sometimes wets the bed, but he is working on managing this without embarrassment. He is keeping track of dry nights.  No dizziness, lightheadedness, or disorientation. He does not require assistance with dressing or bathing and has no behavioral concerns. He is described as a good listener who processes information and recalls it later.       Medications: Outpatient Medications Prior to Visit  Medication Sig   acetaminophen   (TYLENOL ) 500 MG tablet Take 1 tablet (500 mg total) by mouth every 8 (eight) hours as needed for mild pain (pain score 1-3), moderate pain (pain score 4-6), fever or headache.   albuterol  (VENTOLIN  HFA) 108 (90 Base) MCG/ACT inhaler Inhale 2 puffs into the lungs every 6 (six) hours as needed for wheezing or shortness of breath.   cetirizine (ZYRTEC) 10 MG tablet Take 10 mg by mouth daily.   Cholecalciferol  (VITAMIN D3) 50 MCG (2000 UT) TABS TAKE 1 TABLET BY MOUTH EVERY MORNING FOR VIT DAILY INSUFFICIENCY *TAKE WITH FOOD*   cloZAPine  (CLOZARIL ) 100 MG tablet Take 3 tablets (300 mg total) by mouth daily. Take for total 300 mg once daily   cyanocobalamin  (VITAMIN B12) 1000 MCG tablet TAKE 1 TABLET BY MOUTH ONCE DAILY   divalproex  (DEPAKOTE ) 250 MG DR tablet Take one tablet every night along with the 500mg  divalproex    divalproex  (DEPAKOTE ) 500 MG DR tablet Take 1 tablet (500 mg total) by mouth 2 (two) times daily.   docusate sodium  (COLACE) 100 MG capsule Take 1 capsule (100 mg total) by mouth daily.   famotidine  (PEPCID ) 20 MG tablet TAKE 1 TABLET BY MOUTH ONCE DAILY   fenofibrate  (TRICOR ) 48 MG tablet TAKE 1 TABLET BY MOUTH EVERY MORNING *DO NOT CRUSH OR CHEW*   hydroxypropyl methylcellulose / hypromellose (ISOPTO TEARS / GONIOVISC) 2.5 % ophthalmic solution Place 2 drops into both eyes 2 (two) times daily.   hydrOXYzine  (ATARAX ) 50 MG tablet Take 25-50 mg by mouth 2 (two) times daily as needed for anxiety.   ibuprofen (ADVIL)  400 MG tablet Take 1.5 tablets (600 mg total) by mouth every 8 (eight) hours as needed.   Incontinence Supply Disposable (PER-FIT UNDERWEAR EXTRA LARGE) MISC USE AS DIRECTED TO WEAR OVERNIGHT FOR  URINARY INCONTINENCE   ipratropium-albuterol  (DUONEB) 0.5-2.5 (3) MG/3ML SOLN Take 3 mLs by nebulization every 6 (six) hours as needed.   lacosamide  (VIMPAT ) 200 MG TABS tablet Take 1 tablet (200 mg total) by mouth 2 (two) times daily.   levothyroxine  (SYNTHROID ) 25 MCG tablet TAKE 1  TABLET BY MOUTH ONCE DAILY *TAKE ON AN EMPTY STOMACH*   Melatonin 10 MG TABS Take 10 mg by mouth at bedtime as needed (Sleep).   Multiple Vitamin (THEREMS PO) Take 1 tablet by mouth daily.   Multiple Vitamin (THEREMS) TABS TAKE 1 TABLET BY MOUTH EVERY MORNING FOR NUTRITIONAL SUPPLEMENT   omeprazole (PRILOSEC) 20 MG capsule Take 20 mg by mouth daily.   paliperidone  (INVEGA ) 6 MG 24 hr tablet Take 12 mg by mouth every morning.   propranolol  (INDERAL ) 10 MG tablet TAKE 1 TABLET BY MOUTH ONCE DAILY   sertraline  (ZOLOFT ) 100 MG tablet Take 150 mg by mouth at bedtime.   THERATEARS NIGHTTIME 1 % GEL PLACE 2 DROPS INTO BOTH EYES TWICE DAILY *WAIT 3-5 MINUTES BETWEEN 2 EYE MEDS*   Vibegron  (GEMTESA ) 75 MG TABS Take 1 tablet (75 mg total) by mouth daily.   Vibegron  (GEMTESA ) 75 MG TABS Take 1 tablet (75 mg total) by mouth daily.   zonisamide  (ZONEGRAN ) 50 MG capsule Take 1 capsule (50 mg total) by mouth daily.   No facility-administered medications prior to visit.        Objective    BP 94/67 (BP Location: Right Arm, Patient Position: Sitting, Cuff Size: Normal)   Pulse 91   Resp 16   Ht 6' (1.829 m)   Wt 221 lb 1.6 oz (100.3 kg)   SpO2 99%   BMI 29.99 kg/m     Physical Exam Vitals and nursing note reviewed.  Constitutional:      General: He is not in acute distress.    Appearance: Normal appearance.  HENT:     Head: Normocephalic and atraumatic.  Eyes:     General: No scleral icterus.    Conjunctiva/sclera: Conjunctivae normal.  Cardiovascular:     Rate and Rhythm: Normal rate.  Pulmonary:     Effort: Pulmonary effort is normal.  Neurological:     Mental Status: He is alert and oriented to person, place, and time. Mental status is at baseline.  Psychiatric:        Mood and Affect: Mood normal.        Behavior: Behavior normal.      No results found for any visits on 01/20/24.  Assessment & Plan    Seizure [facility])  Health care home, active care coordination  Personal  history of traumatic brain injury  Lives in group home  Urge incontinence  Medication management  Immunization due -     Flu vaccine trivalent PF, 6mos and older(Flulaval,Afluria,Fluarix,Fluzone)     Seizure Epilepsy management with lacosamide , zonisamide  and valproate. Discussed medication side effects, primarily sedation.  Follows with neurology, Dr. Arthea Farrow; defer to specialist management. - Continue lacosamide , zonisamide  and divalproex .  Defer to specialist management.  Health care Home, active care coordination; personal history of traumatic brain injury; lives in group home Filled out and provided FL 2 form for patient to continue to reside in his current group home, as well as his visit summary.  Urge incontinence; medication management Neurogenic bladder managed with Gemtesa . Experiences urinary and fecal incontinence, occasional bedwetting, and diarrhea linked to certain foods.  Patient appears to still be on Myrbetriq  in addition to Gemtesa . - Patient's caretaker to clarify with urology whether he should have stopped the Myrbetriq , as the notes do not specify this and I suspect he should have stopped the Myrbetriq . - Monitor urinary incontinence.    Return in about 5 months (around 06/09/2024) for Chronic f/u.      I discussed the assessment and treatment plan with the patient  The patient was provided an opportunity to ask questions and all were answered. The patient agreed with the plan and demonstrated an understanding of the instructions.   The patient was advised to call back or seek an in-person evaluation if the symptoms worsen or if the condition fails to improve as anticipated.    LAURAINE LOISE BUOY, DO  [facility] [phone] (phone) [phone] (fax)  [facility]

## 2024-01-29 ENCOUNTER — Telehealth: Payer: Self-pay

## 2024-01-29 DIAGNOSIS — R32 Unspecified urinary incontinence: Secondary | ICD-10-CM

## 2024-01-29 NOTE — Progress Notes (Signed)
 Complex Care Management Note  Care Guide Note 01/29/2024 Name: Travell Desaulniers MRN: [id number] DOB: Feb 27, 1986  Gurpreet Mikhail is a 38 y.o. year old male who sees Pardue, Lauraine SAILOR, DO for primary care. I reached out to [name] by phone today to offer complex care management services.  Mr. Munger was given information about Complex Care Management services today including:   The Complex Care Management services include support from the care team which includes your Nurse Care Manager, Clinical Social Worker, or Pharmacist.  The Complex Care Management team is here to help remove barriers to the health concerns and goals most important to you. Complex Care Management services are voluntary, and the patient may decline or stop services at any time by request to their care team member.   Complex Care Management Consent Status: Patient agreed to services and verbal consent obtained.   Follow up plan:  Telephone appointment with complex care management team member scheduled for:  01/30/24 AND 02/03/24  Encounter Outcome:  Patient Scheduled  [name] [facility], [facility] Guide  Direct Dial: [phone]  Fax [phone]

## 2024-01-29 NOTE — Progress Notes (Signed)
 Complex Care Management Note  Care Guide Note 01/29/2024 Name: Eddie Berry MRN: [id number] DOB: 03-17-1986  Eddie Berry is a 38 y.o. year old male who sees Pardue, Lauraine SAILOR, DO for primary care. I reached out to [name] by phone today to offer complex care management services.  Eddie Berry was given information about Complex Care Management services today including:   The Complex Care Management services include support from the care team which includes your Nurse Care Manager, Clinical Social Worker, or Pharmacist.  The Complex Care Management team is here to help remove barriers to the health concerns and goals most important to you. Complex Care Management services are voluntary, and the patient may decline or stop services at any time by request to their care team member.   Complex Care Management Consent Status: Patient agreed to services and verbal consent obtained.   Follow up plan:  Telephone appointment with complex care management team member scheduled for:  01/30/24  Encounter Outcome:  Patient Scheduled  [name] [facility], [facility] Guide  Direct Dial: [phone]  Fax [phone]

## 2024-01-30 ENCOUNTER — Telehealth: Payer: Self-pay

## 2024-01-30 NOTE — Progress Notes (Signed)
 Complex Care Management Note Care Guide Note  01/30/2024 Name: Eddie Berry MRN: [id number] DOB: [birth year]   Complex Care Management Outreach Attempts: An unsuccessful telephone outreach was attempted today to offer the patient information about available complex care management services.  Follow Up Plan:  Additional outreach attempts will be made to offer the patient complex care management information and services.   Encounter Outcome:  No Answer-Left voicemail  [name] [facility], [facility] Guide  Direct Dial: [phone]  Fax [phone]

## 2024-01-30 NOTE — Patient Instructions (Signed)
 Eddie Berry - I am sorry I was unable to reach you today for our scheduled appointment. I work with Donzella Lauraine SAILOR, DO and am calling to support your healthcare needs. Please contact me at [phone]  at your earliest convenience. I look forward to speaking with you soon.   Thank you,  Hendricks Her RN, BSN  [facility] RN Case Manager   Direct [phone]

## 2024-02-03 ENCOUNTER — Other Ambulatory Visit: Payer: Self-pay | Admitting: Licensed Clinical Social Worker

## 2024-02-03 NOTE — Patient Outreach (Signed)
 Complex Care Management   Visit Note  02/03/2024  Name:  Eddie Berry MRN: [id number] DOB: Sep 27, 1985  Situation: Referral received for Complex Care Management related to Mental/Behavioral Health diagnosis TBI/BH needs. I obtained verbal consent directly from Patient.  Visit completed with Caregiver  (group home/caregiver [name] Sharps) on the phone.  Background:   Past Medical History:  Diagnosis Date   Allergy    Asthma    Autism    Blood transfusion without reported diagnosis    Seizures (HCC)    TBI (traumatic brain injury) (HCC)     Assessment: Patient Reported Symptoms:  Cognitive Cognitive Status: Confused or disoriented, Able to follow simple commands Cognitive/Intellectual Conditions Management [RPT]: Brain Injury, Behavior Disorders Behavior Disorders: Austism listed in medical history but not in dx Brain Injury: TBI/Seizures   Health Maintenance Behaviors: Stress management, Annual physical exam Healing Pattern: Average Health Facilitated by: Rest, Stress management  Neurological Neurological Review of Symptoms: No symptoms reported Neurological Management Strategies: Medication therapy, Routine screening Neurological Self-Management Outcome: 3 (uncertain)  Psychosocial   Behavioral Management Strategies: Medication therapy Behavioral Health Self-Management Outcome: 3 (uncertain) Major Change/Loss/Stressor/Fears (CP): Medical condition, self Techniques to Cope with Loss/Stress/Change: Medication Quality of Family Relationships: involved Do you feel physically threatened by others?: No    02/03/2024    PHQ2-9 Depression Screening   Little interest or pleasure in doing things Several days  Feeling down, depressed, or hopeless Not at all  PHQ-2 - Total Score 1  Trouble falling or staying asleep, or sleeping too much    Feeling tired or having little energy    Poor appetite or overeating     Feeling bad about yourself - or that you are a failure or have let  yourself or your family down    Trouble concentrating on things, such as reading the newspaper or watching television    Moving or speaking so slowly that other people could have noticed.  Or the opposite - being so fidgety or restless that you have been moving around a lot more than usual    Thoughts that you would be better off dead, or hurting yourself in some way    PHQ2-9 Total Score    If you checked off any problems, how difficult have these problems made it for you to do your work, take care of things at home, or get along with other people    Depression Interventions/Treatment      There were no vitals filed for this visit.  Medications Reviewed Today     Reviewed by Merlynn Lyle CROME, LCSW (Social Worker) on 02/03/24 at 1309  Med List Status: <None>   Medication Order Taking? Sig Documenting Provider Last Dose Status Informant  acetaminophen  (TYLENOL ) 500 MG tablet [id number]  Take 1 tablet (500 mg total) by mouth every 8 (eight) hours as needed for mild pain (pain score 1-3), moderate pain (pain score 4-6), fever or headache. Von Bellis, MD  Active Care Giver, Pharmacy Records           Med Note JACKOLYN WADDELL DEL   Tue Sep 23, 2023  9:19 AM)    albuterol  (VENTOLIN  HFA) 108 [phone] Base) MCG/ACT inhaler [id number]  Inhale 2 puffs into the lungs every 6 (six) hours as needed for wheezing or shortness of breath. [provider]  Active Pharmacy Records, Care Giver           Med Note JACKOLYN WADDELL DEL   Tue Sep 23, 2023  9:19 AM)  cetirizine (ZYRTEC) 10 MG tablet [id number]  Take 10 mg by mouth daily. [provider]  Active Pharmacy Records, Care Giver  Cholecalciferol  (VITAMIN D3) 50 MCG (2000 UT) TABS [id number]  TAKE 1 TABLET BY MOUTH EVERY MORNING FOR VIT DAILY INSUFFICIENCY *TAKE WITH FOOD* Pardue, Sarah N, DO  Active   cloZAPine  (CLOZARIL ) 100 MG tablet [id number]  Take 3 tablets (300 mg total) by mouth daily. Take for total 300 mg once daily Akula, Vijaya, MD   Active   cyanocobalamin  (VITAMIN B12) 1000 MCG tablet [id number]  TAKE 1 TABLET BY MOUTH ONCE DAILY Pardue, Lauraine SAILOR, DO  Active   divalproex  (DEPAKOTE ) 250 MG DR tablet [id number]  Take one tablet every night along with the 500mg  divalproex  Gasper Nancyann BRAVO, MD  Active   divalproex  (DEPAKOTE ) 500 MG DR tablet [id number] Yes Take 1 tablet (500 mg total) by mouth 2 (two) times daily. Von Bellis, MD  Active Care Giver, Pharmacy Records  docusate sodium  (COLACE) 100 MG capsule [id number]  Take 1 capsule (100 mg total) by mouth daily. Donzella Lauraine SAILOR, DO  Active Pharmacy Records, Care Giver  famotidine  (PEPCID ) 20 MG tablet [id number]  TAKE 1 TABLET BY MOUTH ONCE DAILY Pardue, Sarah N, DO  Active   fenofibrate  (TRICOR ) 48 MG tablet [id number]  TAKE 1 TABLET BY MOUTH EVERY MORNING *DO NOT CRUSH OR CHEW* Pardue, Sarah N, DO  Active   hydroxypropyl methylcellulose / hypromellose (ISOPTO TEARS / GONIOVISC) 2.5 % ophthalmic solution [id number]  Place 2 drops into both eyes 2 (two) times daily. Donzella Lauraine SAILOR, DO  Active Pharmacy Records, Care Giver  hydrOXYzine  (ATARAX ) 50 MG tablet [id number]  Take 25-50 mg by mouth 2 (two) times daily as needed for anxiety. [provider]  Active Care Giver, Pharmacy Records           Med Note JACKOLYN WADDELL DEL   Tue Sep 23, 2023  9:19 AM)    ibuprofen (ADVIL) 400 MG tablet [id number]  Take 1.5 tablets (600 mg total) by mouth every 8 (eight) hours as needed. Von Bellis, MD  Active Care Giver, Pharmacy Records           Med Note JACKOLYN WADDELL DEL   Tue Sep 23, 2023  9:19 AM)    Incontinence Supply Disposable (PER-FIT UNDERWEAR EXTRA LARGE) MISC [id number]  USE AS DIRECTED TO WEAR OVERNIGHT FOR  URINARY INCONTINENCE Donzella Lauraine SAILOR, DO  Active   ipratropium-albuterol  (DUONEB) 0.5-2.5 (3) MG/3ML SOLN [id number]  Take 3 mLs by nebulization every 6 (six) hours as needed. Akula, Vijaya, MD  Active   lacosamide  (VIMPAT ) 200 MG TABS tablet [id number]  Take 1 tablet (200 mg  total) by mouth 2 (two) times daily. Arlander Charleston, MD  Active Pharmacy Records, Care Giver  levothyroxine  (SYNTHROID ) 25 MCG tablet [id number]  TAKE 1 TABLET BY MOUTH ONCE DAILY *TAKE ON AN EMPTY STOMACH* Donzella Lauraine SAILOR, DO  Active   Melatonin 10 MG TABS [id number]  Take 10 mg by mouth at bedtime as needed (Sleep). [provider]  Active Pharmacy Records, Care Giver           Med Note JACKOLYN WADDELL DEL   Tue Sep 23, 2023  9:19 AM)    Multiple Vitamin (THEREMS PO) [id number]  Take 1 tablet by mouth daily. [provider]  Active Pharmacy Records, Care Giver  Multiple Vitamin [facility]) TABS [id number]  TAKE 1 TABLET BY MOUTH EVERY MORNING FOR NUTRITIONAL SUPPLEMENT Pardue, Lauraine SAILOR, DO  Active  omeprazole (PRILOSEC) 20 MG capsule [id number]  Take 20 mg by mouth daily. [provider]  Active Pharmacy Records, Care Giver  paliperidone  (INVEGA ) 6 MG 24 hr tablet [id number]  Take 12 mg by mouth every morning. [provider]  Active Pharmacy Records, Care Giver  propranolol  (INDERAL ) 10 MG tablet [id number]  TAKE 1 TABLET BY MOUTH ONCE DAILY Pardue, Lauraine SAILOR, DO  Active   sertraline  (ZOLOFT ) 100 MG tablet [id number] Yes Take 150 mg by mouth at bedtime. [provider]  Active Pharmacy Records, Care Giver  THERATEARS NIGHTTIME 1 % GEL [id number]  PLACE 2 DROPS INTO BOTH EYES TWICE DAILY *WAIT 3-5 MINUTES BETWEEN 2 EYE MEDS* Pardue, Sarah N, DO  Active   Vibegron  (GEMTESA ) 75 MG TABS [id number]  Take 1 tablet (75 mg total) by mouth daily. Francisca Redell BROCKS, MD  Active   Vibegron  (GEMTESA ) 75 MG TABS [id number]  Take 1 tablet (75 mg total) by mouth daily. Helon Clotilda LABOR, PA-C  Active   zonisamide  (ZONEGRAN ) 50 MG capsule [id number]  Take 1 capsule (50 mg total) by mouth daily. Cherlyn Labella, MD  Expired 12/24/23 2359   Med List Note Cheri, Melissa A, CPhT 08/21/23 0034): Pt resides at [facility] tel [phone] or [phone], Pt's legal guardian, Delon Laurier norlander [phone]            Recommendation:   PCP Follow-up Specialty provider follow-up Neurology Appointment in November Continue Current Plan of Care  Follow Up Plan:   Telephone follow-up in 1 month  Lyle Rung, BSW, MSW, LCSW Licensed Clinical Social Worker [employer]   [email or web address] Direct Dial: [phone]

## 2024-02-03 NOTE — Progress Notes (Signed)
 Complex Care Management Care Guide Note  02/03/2024 Name: Eddie Berry MRN: [id number] DOB: 16-Jul-1985  Eddie Berry is a 38 y.o. year old male who is a primary care patient of Pardue, Lauraine SAILOR, DO and is actively engaged with the care management team. I reached out to [name] by phone today to assist with re-scheduling  with the RN Case Manager.  Follow up plan: Unsuccessful telephone outreach attempt made. A HIPAA compliant phone message was left for the patient providing contact information and requesting a return call.  [name] [facility], [facility] Guide  Direct Dial: [phone]  Fax [phone]

## 2024-02-04 NOTE — Patient Instructions (Signed)
 Visit Information  Thank you for taking time to visit with me today. Please don't hesitate to contact me if I can be of assistance to you before our next scheduled appointment.  Your next care management appointment is by telephone on 02/23/24 at 10 am  Please call the care guide team at [phone] if you need to cancel, schedule, or reschedule an appointment.   Please call the Suicide and Crisis Lifeline: 988 call the [place]  National Suicide Prevention Lifeline: 302-237-8350 or TTY: 713-564-7185 TTY 6122155377) to talk to a trained counselor call 1-800-273-TALK (toll free, 24 hour hotline) go to [facility] [address] [place] [phone]) if you are experiencing a Mental Health or Behavioral Health Crisis or need someone to talk to.  Lyle Rung, BSW, MSW, LCSW Licensed Clinical Social Worker [employer] [facility]   [facility] [email or web address] Direct Dial: [phone]

## 2024-02-11 ENCOUNTER — Other Ambulatory Visit: Payer: Self-pay

## 2024-02-11 NOTE — Patient Instructions (Signed)
 Visit Information  Mr. Eddie Berry was given information about Medicaid Managed Care team care coordination services as a part of their [facility] Community Plan Medicaid benefit.   If you would like to schedule transportation through your [facility], please call the following number at least 2 days in advance of your appointment: [phone]   Rides for urgent appointments can also be made after hours by calling Member Services.  Call the Behavioral Health Crisis Line at [phone], at any time, 24 hours a day, 7 days a week. If you are in danger or need immediate medical attention call 911.  Please see education materials related to nocturnal polyuria provided by MyChart link.  Patient verbalizes understanding of instructions and care plan provided today and agrees to view in MyChart. Active MyChart status and patient understanding of how to access instructions and care plan via MyChart confirmed with patient.     Telephone follow up appointment with Managed Medicaid care management team member scheduled for:02-25-2024 at 1:00 PM   Eddie Berry Her RN, BSN  [place] VBCI-Population Health RN Case Manager   Direct [phone]   Following is a copy of your plan of care:  There are no care plans that you recently modified to display for this patient.

## 2024-02-23 ENCOUNTER — Other Ambulatory Visit: Payer: Self-pay | Admitting: *Deleted

## 2024-02-23 NOTE — Patient Instructions (Signed)
 Visit Information  Thank you for taking time to visit with me today. Please don't hesitate to contact me if I can be of assistance to you.  Your next care management appointment is no further scheduled appointments.  Please call this social worker with any additional community resource needs    Please call the care guide team at [phone] if you need to cancel, schedule, or reschedule an appointment.   Please call the Suicide and Crisis Lifeline: 988 call the [place]  National Suicide Prevention Lifeline: 857-381-2836 or TTY: 858-349-4177 TTY 818-463-0545) to talk to a trained counselor call 1-800-273-TALK (toll free, 24 hour hotline) call 911 if you are experiencing a Mental Health or Behavioral Health Crisis or need someone to talk to.  Rehaan Viloria, LCSW [place]  [facility], [facility] Health Licensed Clinical Social Worker  Direct Dial: [phone]

## 2024-02-23 NOTE — Patient Outreach (Signed)
 Complex Care Management   Visit Note  02/23/2024  Name:  Eddie Berry MRN: [id number] DOB: 07-20-1985  Situation: Referral received for Complex Care Management related to Mental/Behavioral Health diagnosis TBI/BH needs. I obtained verbal consent directly from Patient.'s legal guardian  Visit completed with Caregiver  (group home/caregiver [name] Sharps) on the phone. Patient currently lives in a group home, patient's transportation, food, meals, medication and mental health needs are all provided by patient's group home. Patient currently being provided by the [facility] in [place]. [name] Sharps confirmed no additional community resource needs at this time. Background:   Past Medical History:  Diagnosis Date   Allergy    Asthma    Autism    Blood transfusion without reported diagnosis    Seizures (HCC)    TBI (traumatic brain injury) (HCC)     Assessment: Patient Reported Symptoms:  Cognitive Cognitive Status: Able to follow simple commands Cognitive/Intellectual Conditions Management [RPT]: Brain Injury, Behavior Disorders Behavior Disorders: patient diagnosed with Autism Brain Injury: TBI/Seizures   Health Maintenance Behaviors: Annual physical exam Healing Pattern: Average Health Facilitated by: Rest  Neurological   Neurological Management Strategies: Medication therapy, Routine screening Neurological Self-Management Outcome: 4 (good) Neurological Comment: Patient actively followed by a Neurologist to manage seizure disorder-Dr. Arthea Farrow  HEENT HEENT Symptoms Reported: No symptoms reported      Cardiovascular Cardiovascular Symptoms Reported: No symptoms reported    Respiratory Respiratory Symptoms Reported: No symptoms reported    Endocrine Endocrine Symptoms Reported: No symptoms reported Is patient diabetic?: No    Gastrointestinal Gastrointestinal Symptoms Reported: No symptoms reported      Genitourinary Genitourinary Symptoms Reported:  Incontinence Additional Genitourinary Details: wets bed at night Genitourinary Management Strategies: Coping strategies, Incontinence garment/pad, Medication therapy Genitourinary Comment: Active with Urologist  Integumentary Integumentary Symptoms Reported: No symptoms reported    Musculoskeletal Musculoskelatal Symptoms Reviewed: No symptoms reported        Psychosocial Psychosocial Symptoms Reported: No symptoms reported Additional Psychological Details: Currently followed by [facility] in [place] for medication management Behavioral Management Strategies: Medication therapy Major Change/Loss/Stressor/Fears (CP): Medical condition, self Behaviors When Feeling Stressed/Fearful: attention seeking behaviors at times, behavioral plan managed by group home staff, Techniques to Cope with Loss/Stress/Change: Medication, Diversional activities Quality of Family Relationships: involved Do you feel physically threatened by others?: No    02/23/2024    PHQ2-9 Depression Screening   Little interest or pleasure in doing things    Feeling down, depressed, or hopeless    PHQ-2 - Total Score    Trouble falling or staying asleep, or sleeping too much    Feeling tired or having little energy    Poor appetite or overeating     Feeling bad about yourself - or that you are a failure or have let yourself or your family down    Trouble concentrating on things, such as reading the newspaper or watching television    Moving or speaking so slowly that other people could have noticed.  Or the opposite - being so fidgety or restless that you have been moving around a lot more than usual    Thoughts that you would be better off dead, or hurting yourself in some way    PHQ2-9 Total Score    If you checked off any problems, how difficult have these problems made it for you to do your work, take care of things at home, or get along with other people    Depression Interventions/Treatment  There were no  vitals filed for this visit.  Medications Reviewed Today     Reviewed by Ermalinda Lenn HERO, LCSW (Social Worker) on 02/23/24 at 1016  Med List Status: <None>   Medication Order Taking? Sig Documenting Provider Last Dose Status Informant  acetaminophen  (TYLENOL ) 500 MG tablet [id number]  Take 1 tablet (500 mg total) by mouth every 8 (eight) hours as needed for mild pain (pain score 1-3), moderate pain (pain score 4-6), fever or headache. Von Bellis, MD  Active Care Giver, Pharmacy Records           Med Note JACKOLYN WADDELL DEL   Tue Sep 23, 2023  9:19 AM)    albuterol  (VENTOLIN  HFA) 108 [phone] Base) MCG/ACT inhaler [id number]  Inhale 2 puffs into the lungs every 6 (six) hours as needed for wheezing or shortness of breath. [provider]  Active Pharmacy Records, Care Giver           Med Note JACKOLYN WADDELL DEL   Tue Sep 23, 2023  9:19 AM)    cetirizine (ZYRTEC) 10 MG tablet [id number] Yes Take 10 mg by mouth daily. [provider]  Active Pharmacy Records, Care Giver  Cholecalciferol  (VITAMIN D3) 50 MCG (2000 UT) TABS [id number] Yes TAKE 1 TABLET BY MOUTH EVERY MORNING FOR VIT DAILY INSUFFICIENCY *TAKE WITH FOOD* Pardue, Sarah N, DO  Active   cloZAPine  (CLOZARIL ) 100 MG tablet [id number] Yes Take 3 tablets (300 mg total) by mouth daily. Take for total 300 mg once daily Akula, Vijaya, MD  Active   cyanocobalamin  (VITAMIN B12) 1000 MCG tablet [id number] Yes TAKE 1 TABLET BY MOUTH ONCE DAILY Pardue, Lauraine SAILOR, DO  Active   divalproex  (DEPAKOTE ) 250 MG DR tablet [id number]  Take one tablet every night along with the 500mg  divalproex   Patient not taking: Reported on 02/23/2024   Gasper Nancyann BRAVO, MD  Active   divalproex  (DEPAKOTE ) 500 MG DR tablet [id number] Yes Take 1 tablet (500 mg total) by mouth 2 (two) times daily. Von Bellis, MD  Active Care Giver, Pharmacy Records  docusate sodium  [facility]) 100 MG capsule [id number] Yes Take 1 capsule (100 mg total) by mouth daily. Donzella Lauraine SAILOR, DO   Active Pharmacy Records, Care Giver  famotidine  (PEPCID ) 20 MG tablet [id number] Yes TAKE 1 TABLET BY MOUTH ONCE DAILY Donzella Lauraine SAILOR, DO  Active   fenofibrate  (TRICOR ) 48 MG tablet [id number] Yes TAKE 1 TABLET BY MOUTH EVERY MORNING *DO NOT CRUSH OR CHEW* Pardue, Sarah N, DO  Active   hydroxypropyl methylcellulose / hypromellose (ISOPTO TEARS / GONIOVISC) 2.5 % ophthalmic solution [id number] Yes Place 2 drops into both eyes 2 (two) times daily. Donzella Lauraine SAILOR, DO  Active Pharmacy Records, Care Giver  hydrOXYzine  (ATARAX ) 50 MG tablet [id number] Yes Take 25-50 mg by mouth 2 (two) times daily as needed for anxiety. [provider]  Active Care Giver, Pharmacy Records           Med Note JACKOLYN WADDELL DEL   Tue Sep 23, 2023  9:19 AM)    ibuprofen (ADVIL) 400 MG tablet [id number] Yes Take 1.5 tablets (600 mg total) by mouth every 8 (eight) hours as needed. Von Bellis, MD  Active Care Giver, Pharmacy Records           Med Note JACKOLYN WADDELL DEL   Tue Sep 23, 2023  9:19 AM)    Incontinence Supply Disposable (PER-FIT UNDERWEAR EXTRA LARGE) MISC [id number] Yes USE AS DIRECTED TO WEAR OVERNIGHT FOR  URINARY  INCONTINENCE Pardue, Lauraine SAILOR, DO  Active   ipratropium-albuterol  (DUONEB) 0.5-2.5 (3) MG/3ML SOLN [id number] Yes Take 3 mLs by nebulization every 6 (six) hours as needed. Akula, Vijaya, MD  Active   lacosamide  (VIMPAT ) 200 MG TABS tablet [id number] Yes Take 1 tablet (200 mg total) by mouth 2 (two) times daily. Arlander Charleston, MD  Active Pharmacy Records, Care Giver  levothyroxine  (SYNTHROID ) 25 MCG tablet [id number] Yes TAKE 1 TABLET BY MOUTH ONCE DAILY *TAKE ON AN EMPTY STOMACH* Donzella Lauraine SAILOR, DO  Active   Melatonin 10 MG TABS [id number] Yes Take 10 mg by mouth at bedtime as needed (Sleep). [provider]  Active Pharmacy Records, Care Giver           Med Note JACKOLYN WADDELL DEL   Tue Sep 23, 2023  9:19 AM)    Multiple Vitamin (THEREMS PO) [id number]  Take 1 tablet by mouth daily.   Patient not taking: Reported on 02/23/2024   [provider]  Active Pharmacy Records, Care Giver  Multiple Vitamin [facility]) TABS [id number] Yes TAKE 1 TABLET BY MOUTH EVERY MORNING FOR NUTRITIONAL SUPPLEMENT Donzella Lauraine SAILOR, DO  Active   omeprazole (PRILOSEC) 20 MG capsule [id number] Yes Take 20 mg by mouth daily. [provider]  Active Pharmacy Records, Care Giver  paliperidone  (INVEGA ) 6 MG 24 hr tablet [id number] Yes Take 12 mg by mouth every morning. [provider]  Active Pharmacy Records, Care Giver  propranolol  (INDERAL ) 10 MG tablet [id number] Yes TAKE 1 TABLET BY MOUTH ONCE DAILY Pardue, Lauraine SAILOR, DO  Active   sertraline  (ZOLOFT ) 100 MG tablet [id number] Yes Take 150 mg by mouth at bedtime. [provider]  Active Pharmacy Records, Care Giver  THERATEARS NIGHTTIME 1 % GEL [id number] Yes PLACE 2 DROPS INTO BOTH EYES TWICE DAILY *WAIT 3-5 MINUTES BETWEEN 2 EYE MEDS* Pardue, Sarah N, DO  Active   Vibegron  (GEMTESA ) 75 MG TABS [id number]  Take 1 tablet (75 mg total) by mouth daily.  Patient not taking: Reported on 02/23/2024   Francisca Redell BROCKS, MD  Active   Vibegron  (GEMTESA ) 75 MG TABS [id number] Yes Take 1 tablet (75 mg total) by mouth daily. Helon Kirsch A, PA-C  Active   zonisamide  (ZONEGRAN ) 50 MG capsule [id number] Yes Take 1 capsule (50 mg total) by mouth daily. Akula, Vijaya, MD  Active   Med List Note ([place], Melissa A, CPhT 08/21/23 0034): Pt resides at [facility] tel [phone] or [phone], Pt's legal guardian, Delon Laurier norlander [phone]            Recommendation:   PCP Follow-up Specialty provider follow-up as scheduled RNCM scheduled 02/25/24  Follow Up Plan:   Closing From:  social work at this time-patient's group home provider to contact this Child psychotherapist with any community resource needs   [employer], [employer] [place]  Value-Based [employer], [employer] Health Licensed Clinical Social Worker  Direct  Dial: [phone]

## 2024-02-25 ENCOUNTER — Telehealth: Payer: Self-pay

## 2024-02-25 DIAGNOSIS — Z79899 Other long term (current) drug therapy: Secondary | ICD-10-CM | POA: Diagnosis not present

## 2024-02-25 DIAGNOSIS — S062X0D Diffuse traumatic brain injury without loss of consciousness, subsequent encounter: Secondary | ICD-10-CM | POA: Diagnosis not present

## 2024-02-25 NOTE — Patient Instructions (Signed)
 [name] - I am sorry I was unable to reach you today for our scheduled appointment. I work with Donzella Lauraine SAILOR, DO and am calling to support your healthcare needs. Please contact me at [phone]  at your earliest convenience. I look forward to speaking with you soon.   Thank you,  Hendricks Her RN, BSN  [place] VBCI-Population Health RN Case Manager   Direct [phone]

## 2024-03-02 ENCOUNTER — Telehealth: Payer: Self-pay

## 2024-03-02 NOTE — Patient Instructions (Signed)
 Visit Information  Mr. Wenke was given information about Medicaid Managed Care team care coordination services as a part of their [facility] Community Plan Medicaid benefit.   If you would like to schedule transportation through your [facility], please call the following number at least 2 days in advance of your appointment: [phone]   Rides for urgent appointments can also be made after hours by calling Member Services.  Call the Behavioral Health Crisis Line at [phone], at any time, 24 hours a day, 7 days a week. If you are in danger or need immediate medical attention call 911.  Please see education materials related to skin care for incontinence  provided by MyChart link.  Care plan and visit instructions communicated with the patient verbally today. Patient agrees to receive a copy in MyChart. Active MyChart status and patient understanding of how to access instructions and care plan via MyChart confirmed with patient.     Telephone follow up appointment with Managed Medicaid care management team member scheduled for:04-05-2024 at 10:00 am   Hendricks Her RN, BSN  [place] VBCI-Population Health RN Case Manager   Direct [phone]   Following is a copy of your plan of care:  There are no care plans that you recently modified to display for this patient.

## 2024-03-19 ENCOUNTER — Other Ambulatory Visit: Payer: Self-pay | Admitting: Family Medicine

## 2024-03-22 NOTE — Telephone Encounter (Signed)
 Requested medication (s) are due for refill today: yes  Requested medication (s) are on the active medication list: no  Last refill:  12/21/21  Future visit scheduled: yes  Notes to clinic:  historical medication     Requested Prescriptions  Pending Prescriptions Disp Refills   ALLERGY RELIEF CETIRIZINE 10 MG tablet [Pharmacy Med Name: Allergy Relief Cetirizine 10 MG Tablet] 28 tablet 10    Sig: @ TAKE 1 TABLET BY MOUTH ONCE DAILY     Ear, Nose, and Throat:  Antihistamines 2 Passed - 03/22/2024  1:27 PM      Passed - Cr in normal range and within 360 days    Creatinine, Ser  Date Value Ref Range Status  12/20/2023 1.04 0.61 - 1.24 mg/dL Final         Passed - Valid encounter within last 12 months    Recent Outpatient Visits           2 months ago Seizure [facility])   [place] [facility] Pardue, Lauraine SAILOR, DO   3 months ago Medicare annual wellness visit, subsequent   [facility] [place], Lauraine SAILOR, DO   4 months ago Non-seasonal allergic rhinitis due to other allergic trigger   [place] [facility] Maxwell, Jamul, PA-C   4 months ago Seizure-like activity [facility])   [facility] Pardue, Lauraine SAILOR, DO   5 months ago Observed seizure-like activity [facility])   [place] [facility] Gasper Nancyann BRAVO, MD       Future Appointments             In 3 months McG[place]an, Clotilda DELENA RIGGERS [facility] 

## 2024-04-05 ENCOUNTER — Other Ambulatory Visit: Payer: Self-pay

## 2024-04-05 NOTE — Patient Instructions (Signed)
 Visit Information  Mr. Bisping was given information about Medicaid Managed Care team care coordination services as a part of their [facility] Community Plan Medicaid benefit.   If you would like to schedule transportation through your [facility], please call the following number at least 2 days in advance of your appointment: [phone]   Rides for urgent appointments can also be made after hours by calling Member Services.  Call the Behavioral Health Crisis Line at [phone], at any time, 24 hours a day, 7 days a week. If you are in danger or need immediate medical attention call 911.  Please see education materials related to flu vaccine  provided by MyChart link.  Care plan and visit instructions communicated with the patient verbally today. Patient agrees to receive a copy in MyChart. Active MyChart status and patient understanding of how to access instructions and care plan via MyChart confirmed with patient.     Telephone follow up appointment with Managed Medicaid care management team member scheduled for: 05-11-2023 at 10:00 AM   Hendricks Her RN, BSN   I VBCI-Population Health RN Case Manager   Direct [phone]   Following is a copy of your plan of care:  There are no care plans that you recently modified to display for this patient.

## 2024-04-27 ENCOUNTER — Other Ambulatory Visit: Payer: Self-pay | Admitting: Family Medicine

## 2024-04-27 DIAGNOSIS — J3089 Other allergic rhinitis: Secondary | ICD-10-CM

## 2024-05-10 ENCOUNTER — Other Ambulatory Visit: Payer: Self-pay

## 2024-05-10 NOTE — Patient Outreach (Signed)
 Complex Care Management   Visit Note  05/10/2024  Name:  Eddie Berry MRN: [id number] DOB: 12-24-1985  Situation: Referral received for Complex Care Management related to nocturnal polyuria I obtained verbal consent from Caregiver.  Visit completed with Caregiver  on the phone  Background:   Past Medical History:  Diagnosis Date   Allergy    Asthma    Autism    Blood transfusion without reported diagnosis    Seizures (HCC)    TBI (traumatic brain injury) (HCC)     Assessment: Patient Reported Symptoms:  Cognitive Cognitive Status: Able to follow simple commands Cognitive/Intellectual Conditions Management [RPT]: Brain Injury, Behavior Disorders Behavior Disorders: Autism Brain Injury: TBI- Brain Seizures   Health Maintenance Behaviors: Annual physical exam Healing Pattern: Average Health Facilitated by: Rest  Neurological Neurological Review of Symptoms: No symptoms reported Neurological Management Strategies: Medication therapy Neurological Self-Management Outcome: 4 (good) Neurological Comment: Dr Arthea Farrow- Neurologist following  HEENT HEENT Symptoms Reported: No symptoms reported HEENT Self-Management Outcome: 4 (good)    Cardiovascular Cardiovascular Symptoms Reported: No symptoms reported Does patient have uncontrolled Hypertension?: No Cardiovascular Management Strategies: Routine screening Weight: 226 lb (102.5 kg) Cardiovascular Self-Management Outcome: 4 (good)  Respiratory Respiratory Symptoms Reported: No symptoms reported Respiratory Management Strategies: Routine screening Respiratory Self-Management Outcome: 4 (good)  Endocrine Endocrine Symptoms Reported: No symptoms reported Is patient diabetic?: No Endocrine Self-Management Outcome: 4 (good)  Gastrointestinal Gastrointestinal Symptoms Reported: No symptoms reported Gastrointestinal Management Strategies: Coping strategies, Exercise Gastrointestinal Self-Management Outcome: 4 (good)     Genitourinary Genitourinary Symptoms Reported: Incontinence Additional Genitourinary Details: evening- Much improved-  Per Caregiver- It's as good as it can get. Genitourinary Management Strategies: Incontinence garment/pad, Coping strategies Genitourinary Self-Management Outcome: 4 (good)  Integumentary Integumentary Symptoms Reported: No symptoms reported Skin Management Strategies: Coping strategies, Routine screening Skin Self-Management Outcome: 4 (good)  Musculoskeletal Musculoskelatal Symptoms Reviewed: No symptoms reported Musculoskeletal Management Strategies: Coping strategies, Routine screening Musculoskeletal Self-Management Outcome: 4 (good) Falls in the past year?: Yes Number of falls in past year: 2 or more Was there an injury with Fall?: No Fall Risk Category Calculator: 2 Patient Fall Risk Level: Moderate Fall Risk Patient at Risk for Falls Due to: History of fall(s), Impaired balance/gait, Impaired mobility Fall risk Follow up: Falls evaluation completed, Education provided, Falls prevention discussed  Psychosocial Psychosocial Symptoms Reported: No symptoms reported Additional Psychological Details: [facility] in [place] follows for psych meds Behavioral Management Strategies: Coping strategies, Medication therapy Behavioral Health Self-Management Outcome: 4 (good) Major Change/Loss/Stressor/Fears (CP): Medical condition, self Techniques to Cope with Loss/Stress/Change: Diversional activities, Medication Quality of Family Relationships: involved, helpful, supportive Do you feel physically threatened by others?: No    05/10/2024    PHQ2-9 Depression Screening   Little interest or pleasure in doing things Not at all (Per Caregiver report)  Feeling down, depressed, or hopeless Not at all (per caregiver report)  PHQ-2 - Total Score 0  Trouble falling or staying asleep, or sleeping too much    Feeling tired or having little energy    Poor appetite or overeating      Feeling bad about yourself - or that you are a failure or have let yourself or your family down    Trouble concentrating on things, such as reading the newspaper or watching television    Moving or speaking so slowly that other people could have noticed.  Or the opposite - being so fidgety or restless that you have been moving around a lot more than  usual    Thoughts that you would be better off dead, or hurting yourself in some way    PHQ2-9 Total Score    If you checked off any problems, how difficult have these problems made it for you to do your work, take care of things at home, or get along with other people    Depression Interventions/Treatment      Today's Vitals   05/10/24 1552  Weight: 226 lb (102.5 kg)   Pain Scale: 0-10 Pain Score: 0-No pain  Medications Reviewed Today     Reviewed by Kay Hendricks MATSU, RN (Case Manager) on 05/10/24 at 1549  Med List Status: <None>   Medication Order Taking? Sig Documenting Provider Last Dose Status Informant  acetaminophen  (TYLENOL ) 500 MG tablet [id number] Yes Take 1 tablet (500 mg total) by mouth every 8 (eight) hours as needed for mild pain (pain score 1-3), moderate pain (pain score 4-6), fever or headache. Von Bellis, MD  Active Care Giver, Pharmacy Records           Med Note JACKOLYN WADDELL DEL   Tue Sep 23, 2023  9:19 AM)    albuterol  (VENTOLIN  HFA) 108 [phone] Base) MCG/ACT inhaler [id number] Yes Inhale 2 puffs into the lungs every 6 (six) hours as needed for wheezing or shortness of breath. [provider]  Active Pharmacy Records, Care Giver           Med Note JACKOLYN WADDELL DEL   Tue Sep 23, 2023  9:19 AM)    ALLERGY RELIEF CETIRIZINE 10 MG tablet [id number] Yes @ TAKE 1 TABLET BY MOUTH ONCE DAILY Donzella Lauraine SAILOR, DO  Active   Cholecalciferol  (VITAMIN D3) 50 MCG (2000 UT) TABS [id number] Yes TAKE 1 TABLET BY MOUTH EVERY MORNING FOR VIT DAILY INSUFFICIENCY *TAKE WITH FOOD* Pardue, Sarah N, DO  Active   cloZAPine  (CLOZARIL ) 100  MG tablet [id number] Yes Take 3 tablets (300 mg total) by mouth daily. Take for total 300 mg once daily Akula, Vijaya, MD  Active   cyanocobalamin  (VITAMIN B12) 1000 MCG tablet [id number] Yes TAKE 1 TABLET BY MOUTH ONCE DAILY Pardue, Lauraine SAILOR, DO  Active   divalproex  (DEPAKOTE ) 250 MG DR tablet [id number]  Take one tablet every night along with the 500mg  divalproex   Patient not taking: Reported on 05/10/2024   Gasper Nancyann BRAVO, MD  Consider Medication Status and Discontinue   divalproex  (DEPAKOTE ) 500 MG DR tablet [id number] Yes Take 1 tablet (500 mg total) by mouth 2 (two) times daily. Von Bellis, MD  Active Care Giver, Pharmacy Records  docusate sodium  [facility]) 100 MG capsule [id number] Yes Take 1 capsule (100 mg total) by mouth daily. Donzella Lauraine SAILOR, DO  Active Pharmacy Records, Care Giver  famotidine  (PEPCID ) 20 MG tablet [id number] Yes TAKE 1 TABLET BY MOUTH ONCE DAILY Donzella Lauraine SAILOR, DO  Active   fenofibrate  (TRICOR ) 48 MG tablet [id number] Yes TAKE 1 TABLET BY MOUTH EVERY MORNING *DO NOT CRUSH OR CHEW* Pardue, Sarah N, DO  Active   hydroxypropyl methylcellulose / hypromellose (ISOPTO TEARS / GONIOVISC) 2.5 % ophthalmic solution [id number] Yes Place 2 drops into both eyes 2 (two) times daily. Donzella Lauraine SAILOR, DO  Active Pharmacy Records, Care Giver  hydrOXYzine  (ATARAX ) 50 MG tablet [id number] Yes Take 25-50 mg by mouth 2 (two) times daily as needed for anxiety. [provider]  Active Care Giver, Pharmacy Records           Med Note [place], [place]   [place]  Sep 23, 2023  9:19 AM)    ibuprofen (ADVIL) 400 MG tablet [id number] Yes Take 1.5 tablets (600 mg total) by mouth every 8 (eight) hours as needed. Von Bellis, MD  Active Care Giver, Pharmacy Records           Med Note JACKOLYN WADDELL DEL   Tue Sep 23, 2023  9:19 AM)    Incontinence Supply Disposable (PER-FIT UNDERWEAR EXTRA LARGE) MISC [id number] Yes USE AS DIRECTED TO WEAR OVERNIGHT FOR  URINARY INCONTINENCE Donzella Lauraine SAILOR, DO  Active    ipratropium-albuterol  (DUONEB) 0.5-2.5 (3) MG/3ML SOLN [id number] Yes Take 3 mLs by nebulization every 6 (six) hours as needed. Akula, Vijaya, MD  Active   lacosamide  (VIMPAT ) 200 MG TABS tablet [id number] Yes Take 1 tablet (200 mg total) by mouth 2 (two) times daily. Arlander [place], MD  Active Pharmacy Records, Care Giver  levothyroxine  (SYNTHROID ) 25 MCG tablet [id number] Yes TAKE 1 TABLET BY MOUTH ONCE DAILY *TAKE ON AN EMPTY STOMACH* Donzella Lauraine SAILOR, DO  Active   Melatonin 10 MG TABS [id number] Yes Take 10 mg by mouth at bedtime as needed (Sleep). [provider]  Active Pharmacy Records, Care Giver           Med Note JACKOLYN WADDELL DEL   Tue Sep 23, 2023  9:19 AM)    Multiple Vitamin (THEREMS PO) [id number] Yes Take 1 tablet by mouth daily. [provider]  Active Pharmacy Records, Care Giver  Multiple Vitamin [facility]) TABS [id number] Yes TAKE 1 TABLET BY MOUTH EVERY MORNING FOR NUTRITIONAL SUPPLEMENT Donzella Lauraine SAILOR, DO  Active   omeprazole (PRILOSEC) 20 MG capsule [id number] Yes Take 20 mg by mouth daily. [provider]  Active Pharmacy Records, Care Giver  paliperidone  (INVEGA ) 6 MG 24 hr tablet [id number] Yes Take 12 mg by mouth every morning. [provider]  Active Pharmacy Records, Care Giver  propranolol  (INDERAL ) 10 MG tablet [id number] Yes TAKE 1 TABLET BY MOUTH ONCE DAILY Pardue, Lauraine SAILOR, DO  Active   sertraline  (ZOLOFT ) 100 MG tablet [id number] Yes Take 150 mg by mouth at bedtime. [provider]  Active Pharmacy Records, Care Giver  THERATEARS NIGHTTIME 1 % GEL [id number] Yes PLACE 2 DROPS INTO BOTH EYES TWICE DAILY *WAIT 3-5 MINUTES BETWEEN 2 EYE MEDS* Pardue, Sarah N, DO  Active   Vibegron  (GEMTESA ) 75 MG TABS [id number]  Take 1 tablet (75 mg total) by mouth daily.  Patient not taking: Reported on 05/10/2024   Francisca Redell BROCKS, MD  Active   Vibegron  (GEMTESA ) 75 MG TABS [id number] Yes Take 1 tablet (75 mg total) by mouth daily. Helon Kirsch  A, PA-C  Active   zonisamide  (ZONEGRAN ) 50 MG capsule [id number] Yes Take 1 capsule (50 mg total) by mouth daily. Akula, Vijaya, MD  Active   Med List Note ([place], Melissa A, CPhT 08/21/23 0034): Pt resides at [facility] tel [phone] or [phone], Pt's legal guardian, Delon Laurier norlander [phone]             Follow Up Plan:   Patient has met all care management goals. Care Management case will be closed. Patient has been provided contact information should new needs arise.   Hendricks Her RN, BSN  [place] VBCI-Population Health RN Case Information Systems Manager [phone]

## 2024-05-17 ENCOUNTER — Other Ambulatory Visit: Payer: Self-pay | Admitting: Family Medicine

## 2024-05-17 NOTE — Telephone Encounter (Signed)
 LOV- 01/20/2024 NOV- 06/21/2024 LRF- 11/16/2023 Outpatient Medication Detail   Disp Refills Start End   THERATEARS NIGHTTIME 1 % GEL 30 each 10 11/16/2023 --   Sig: PLACE 2 DROPS INTO BOTH EYES TWICE DAILY *WAIT 3-5 MINUTES BETWEEN 2 EYE MEDS*   Sent to pharmacy as: THERATEARS NIGHTTIME 1 % Gel   E-Prescribing Status: Receipt confirmed by pharmacy (11/16/2023  8:35 PM EDT)

## 2024-05-24 ENCOUNTER — Other Ambulatory Visit: Payer: Self-pay | Admitting: Family Medicine

## 2024-05-28 ENCOUNTER — Emergency Department
Admission: EM | Admit: 2024-05-28 | Discharge: 2024-05-29 | Disposition: A | Discharge status: Home / Self Care | Attending: Emergency Medicine | Admitting: Emergency Medicine

## 2024-05-28 ENCOUNTER — Other Ambulatory Visit: Payer: Self-pay

## 2024-05-28 DIAGNOSIS — J45909 Unspecified asthma, uncomplicated: Secondary | ICD-10-CM | POA: Diagnosis not present

## 2024-05-28 DIAGNOSIS — R440 Auditory hallucinations: Secondary | ICD-10-CM | POA: Diagnosis present

## 2024-05-28 DIAGNOSIS — R45851 Suicidal ideations: Secondary | ICD-10-CM | POA: Diagnosis present

## 2024-05-28 DIAGNOSIS — X58XXXS Exposure to other specified factors, sequela: Secondary | ICD-10-CM | POA: Insufficient documentation

## 2024-05-28 DIAGNOSIS — S069X0A Unspecified intracranial injury without loss of consciousness, initial encounter: Secondary | ICD-10-CM | POA: Diagnosis present

## 2024-05-28 DIAGNOSIS — Z79899 Other long term (current) drug therapy: Secondary | ICD-10-CM | POA: Diagnosis not present

## 2024-05-28 DIAGNOSIS — S069XAS Unspecified intracranial injury with loss of consciousness status unknown, sequela: Secondary | ICD-10-CM | POA: Diagnosis not present

## 2024-05-28 DIAGNOSIS — R569 Unspecified convulsions: Secondary | ICD-10-CM | POA: Diagnosis not present

## 2024-05-28 DIAGNOSIS — X58XXXA Exposure to other specified factors, initial encounter: Secondary | ICD-10-CM | POA: Insufficient documentation

## 2024-05-28 DIAGNOSIS — F840 Autistic disorder: Secondary | ICD-10-CM | POA: Diagnosis not present

## 2024-05-28 LAB — CBC
HCT: 41.7 % (ref 39.0–52.0)
Hemoglobin: 13.6 g/dL (ref 13.0–17.0)
MCH: 31.1 pg (ref 26.0–34.0)
MCHC: 32.6 g/dL (ref 30.0–36.0)
MCV: 95.4 fL (ref 80.0–100.0)
Platelets: 268 K/uL (ref 150–400)
RBC: 4.37 MIL/uL (ref 4.22–5.81)
RDW: 12.2 % (ref 11.5–15.5)
WBC: 7.5 K/uL (ref 4.0–10.5)
nRBC: 0 % (ref 0.0–0.2)

## 2024-05-28 LAB — COMPREHENSIVE METABOLIC PANEL WITH GFR
ALT: 34 U/L (ref 0–44)
AST: 69 U/L — ABNORMAL HIGH (ref 15–41)
Albumin: 4.8 g/dL (ref 3.5–5.0)
Alkaline Phosphatase: 59 U/L (ref 38–126)
Anion gap: 14 (ref 5–15)
BUN: 12 mg/dL (ref 6–20)
CO2: 24 mmol/L (ref 22–32)
Calcium: 9.1 mg/dL (ref 8.9–10.3)
Chloride: 108 mmol/L (ref 98–111)
Creatinine, Ser: 0.86 mg/dL (ref 0.61–1.24)
GFR, Estimated: >60 mL/min
Glucose, Bld: 118 mg/dL — ABNORMAL HIGH (ref 70–99)
Potassium: 4.4 mmol/L (ref 3.5–5.1)
Sodium: 146 mmol/L — ABNORMAL HIGH (ref 135–145)
Total Bilirubin: <0.2 mg/dL (ref 0.0–1.2)
Total Protein: 7.8 g/dL (ref 6.5–8.1)

## 2024-05-28 LAB — URINE DRUG SCREEN
Amphetamines: NEGATIVE
Barbiturates: NEGATIVE
Benzodiazepines: NEGATIVE
Cocaine: NEGATIVE
Fentanyl: NEGATIVE
Methadone Scn, Ur: NEGATIVE
Opiates: NEGATIVE
Tetrahydrocannabinol: NEGATIVE

## 2024-05-28 LAB — ETHANOL: Alcohol, Ethyl (B): <15 mg/dL

## 2024-05-28 LAB — VALPROIC ACID LEVEL: Valproic Acid Lvl: 67 ug/mL (ref 50–100)

## 2024-05-28 LAB — TSH: TSH: 1.39 u[IU]/mL (ref 0.350–4.500)

## 2024-05-28 MED ORDER — BENZTROPINE MESYLATE 1 MG PO TABS
0.5000 mg | ORAL_TABLET | Freq: Every day | ORAL | Status: DC
  Administered 2024-05-28: 0.5 mg via ORAL
  Filled 2024-05-28: qty 1

## 2024-05-28 MED ORDER — FAMOTIDINE 20 MG PO TABS
20.0000 mg | ORAL_TABLET | Freq: Every day | ORAL | Status: DC
  Administered 2024-05-29: 20 mg via ORAL
  Filled 2024-05-28: qty 1

## 2024-05-28 MED ORDER — LORATADINE 10 MG PO TABS
10.0000 mg | ORAL_TABLET | Freq: Every day | ORAL | Status: DC
  Administered 2024-05-29: 10 mg via ORAL
  Filled 2024-05-28: qty 1

## 2024-05-28 MED ORDER — CLOZAPINE 100 MG PO TABS
300.0000 mg | ORAL_TABLET | Freq: Every day | ORAL | Status: DC
  Administered 2024-05-29: 300 mg via ORAL
  Filled 2024-05-28: qty 3

## 2024-05-28 MED ORDER — ZONISAMIDE 25 MG PO CAPS
25.0000 mg | ORAL_CAPSULE | Freq: Two times a day (BID) | ORAL | Status: DC
  Administered 2024-05-28 – 2024-05-29 (×2): 25 mg via ORAL
  Filled 2024-05-28 (×2): qty 1

## 2024-05-28 MED ORDER — LEVOTHYROXINE SODIUM 25 MCG PO TABS
25.0000 ug | ORAL_TABLET | Freq: Every day | ORAL | Status: DC
  Administered 2024-05-29: 25 ug via ORAL
  Filled 2024-05-28: qty 1

## 2024-05-28 MED ORDER — MIRABEGRON ER 25 MG PO TB24
25.0000 mg | ORAL_TABLET | Freq: Every day | ORAL | Status: DC
  Administered 2024-05-29: 25 mg via ORAL
  Filled 2024-05-28: qty 1

## 2024-05-28 MED ORDER — FENOFIBRATE 54 MG PO TABS
54.0000 mg | ORAL_TABLET | Freq: Every day | ORAL | Status: DC
  Administered 2024-05-29: 54 mg via ORAL
  Filled 2024-05-28: qty 1

## 2024-05-28 MED ORDER — DIVALPROEX SODIUM 500 MG PO DR TAB
500.0000 mg | DELAYED_RELEASE_TABLET | Freq: Two times a day (BID) | ORAL | Status: DC
  Administered 2024-05-28 – 2024-05-29 (×2): 500 mg via ORAL
  Filled 2024-05-28 (×2): qty 1

## 2024-05-28 MED ORDER — SERTRALINE HCL 50 MG PO TABS
150.0000 mg | ORAL_TABLET | Freq: Every day | ORAL | Status: DC
  Administered 2024-05-28: 150 mg via ORAL
  Filled 2024-05-28: qty 1

## 2024-05-28 MED ORDER — DOCUSATE SODIUM 100 MG PO CAPS
100.0000 mg | ORAL_CAPSULE | Freq: Every day | ORAL | Status: DC
  Administered 2024-05-29: 100 mg via ORAL
  Filled 2024-05-28: qty 1

## 2024-05-28 MED ORDER — PROPRANOLOL HCL 10 MG PO TABS
10.0000 mg | ORAL_TABLET | Freq: Every day | ORAL | Status: DC
  Administered 2024-05-29: 10 mg via ORAL
  Filled 2024-05-28: qty 1

## 2024-05-28 MED ORDER — MELATONIN 5 MG PO TABS
10.0000 mg | ORAL_TABLET | Freq: Every evening | ORAL | Status: DC | PRN
  Administered 2024-05-28: 10 mg via ORAL
  Filled 2024-05-28: qty 2

## 2024-05-28 MED ORDER — PALIPERIDONE ER 3 MG PO TB24
12.0000 mg | ORAL_TABLET | Freq: Every morning | ORAL | Status: DC
  Administered 2024-05-29: 12 mg via ORAL
  Filled 2024-05-28: qty 4

## 2024-05-28 MED ORDER — LACOSAMIDE 50 MG PO TABS
200.0000 mg | ORAL_TABLET | Freq: Two times a day (BID) | ORAL | Status: DC
  Administered 2024-05-28 – 2024-05-29 (×2): 200 mg via ORAL
  Filled 2024-05-28 (×2): qty 4

## 2024-05-28 NOTE — Consult Note (Signed)
 [facility] Psychiatric Consult Initial  Patient Name: .Eddie Berry  MRN: [id number]  DOB: Oct 12, 1985  Consult Order details:  Orders (From admission, onward)     Start     Ordered   05/28/24 1538  CONSULT TO CALL ACT TEAM       Ordering Provider: Viviann Pastor, MD  Provider:  (Not yet assigned)  Question:  Reason for Consult?  Answer:  Psych consult   05/28/24 1537   05/28/24 1538  IP CONSULT TO PSYCHIATRY       Ordering Provider: Viviann Pastor, MD  Provider:  (Not yet assigned)  Question:  Reason for consult:  Answer:  Medication management   05/28/24 1537             Mode of Visit: In person    Psychiatry Consult Evaluation  Service Date: May 28, 2024 LOS:  LOS: 0 days  Chief Complaint Suicidal thoughts  Primary Psychiatric Diagnoses  Suicidal ideations   Assessment  CLINICAL DECISION MAKING:  Based on assessment, patient is recommended for inpatient psychiatric admission after medical clearance due to active suicidal ideations with recent suicide attempts including laying in road and pushing others hoping to be attacked, command-type thoughts to harm self and others, medication nonadherence, inability to meet basic needs such as eating, and concerning statements regarding housemate. Patient requires acute psychiatric stabilization in a safe environment, medication management and monitoring for compliance, assessment and treatment of mood symptoms, and establishment of safe discharge plan with appropriate level of care and support.  This provider attempted to contact legal guardian at this time with no answer. HIPAA compliant voice message left for return call. TTS reaching out to group home for collateral information. Plan was discussed with EDP Viviann at this time. Patient is agreeable to signing voluntarily at this time. However, if patient attempts to elope, recommend involuntary commitment due to the above safety concerns including active suicidal  ideations with recent attempts, thoughts to harm housemate, medication nonadherence, and need to obtain collateral information from group home and contact with legal guardian        Plan   ## Psychiatric Medication Recommendations:  After medication reconciliation completed, recommended to order patient's current home medication regimen.  Need medication reconciliation completed prior to this due to patient not being able to list specific medications at this time and duplicate antipsychotics being listed on current list.  ## Medical Decision Making Capacity: Patient has a guardian and has thus been adjudicated incompetent; please involve patients guardian in medical decision making legal guardian was contacted at 4:31 PM with no answer at this time.  HIPAA compliant voice message left for return call  ## Further Work-up:   -- EKG ordered -- Pertinent labwork reviewed earlier this admission includes: Urine drug screen, CBC, ethanol, CMP, TSH pending added onto previous blood work   ## Disposition:Patient is currently voluntary at this time and has a legal guardian so is not able to leave without notifying legal guardian. Patient is agreeable to signing voluntarily at this time. However, if patient attempts to leave against medical advice, would recommend involuntary commitment at that point due to suicidal ideations reported by patient with reported plan. Patient is recommended for inpatient psychiatric admission at this time.   ## Behavioral / Environmental: -Utilize compassion and acknowledge the patient's experiences while setting clear and realistic expectations for care.    ## Safety and Observation Level:  - Based on my clinical evaluation, I estimate the patient to be at moderate  risk of self harm in the current setting.  Unit can continue with every 15 minute safety protocol checks - At this time, we recommend  routine. This decision is based on my review of the chart including  patient's history and current presentation, interview of the patient, mental status examination, and consideration of suicide risk including evaluating suicidal ideation, plan, intent, suicidal or self-harm behaviors, risk factors, and protective factors. This judgment is based on our ability to directly address suicide risk, implement suicide prevention strategies, and develop a safety plan while the patient is in the clinical setting. Please contact our team if there is a concern that risk level has changed.  CSSR Risk Category:C-SSRS RISK CATEGORY: High Risk  Suicide Risk Assessment: Patient has following modifiable risk factors for suicide: current symptoms: anxiety/panic, insomnia, impulsivity, anhedonia, hopelessness and triggering events, which we are addressing by recommending inpatient psychiatric admission for further monitoring and stabilization. Patient has following non-modifiable or demographic risk factors for suicide: male gender Patient has the following protective factors against suicide: Access to outpatient mental health care  Thank you for this consult request. Recommendations have been communicated to the primary team.  We will sign off at this time.   RONAL Sharps, NP        History of Present Illness  Relevant Aspects of Hospital ED   Patient Report:  Patient was seen on evaluation today by psychiatry due to reported suicidal ideations for the last 2 to 3 weeks. Patient does have legal guardian noted in chart. On assessment today, patient reported been doing suicide in regards to what brought him in today. When asked for clarification, patient reported he was pushing other people hoping that they would attack him and that he laid down in the road for a long period of time recently. He reported he was also not eating and not taking his medications as prescribed. He reported his triggering events as his group home as he feels as though they blame him for everything there. He  reported living there since last February 2025. He did endorse previous self-harm. He reported the group home was making me mad. Patient endorsed negative feelings towards housemate. Patient also endorsed hearing voices, but upon clarification reported it was his own voices telling him to act on his thoughts to hurt himself and his housemate. He was unsure of his current medications but reported group home administers them.  Patient presenting with active suicidal ideations with recent dangerous behaviors including pushing others hoping to be attacked and laying in road, both representing serious suicide attempts by proxy and direct endangerment. Patient also reports medication nonadherence and not eating, further suggesting decompensation and inability to care for himself safely. Significant concern for harm to self as evidenced by recent suicide attempts and ongoing command-type thoughts instructing him to hurt himself. Additional concern for potential harm to housemate given patient's endorsement of negative feelings toward housemate and internal thoughts telling him to hurt housemate. Patient's living situation at group home appears to be a significant stressor and trigger for current presentation.    Psychiatric and Social History  Psychiatric History:  Information collected from Patient/chart  Prev Dx/Sx: Patient with noted history of seizures and aggressive behavior present in chart review, further information pending contact with legal guardian and group home Current Psych Provider: Unsure Home Meds (current): Unsure Previous Med Trials: Unsure Therapy: Unsure  Prior Psych Hospitalization: Unsure Prior Suicide attempts/Self Harm: Patient endorsed Prior Violence: Aggressive behavior noted in chart  Family Psych  History: Unsure Family Hx suicide: Unsure  Social History:    Occupational Hx: Receives Designer, Television/film Set Hx: Denied Living Situation: Group home  Access to weapons/lethal  means: Denied  Substance History Alcohol : Denied  Tobacco: Denied Illicit drugs: Denied Prescription drug abuse: Denied Rehab hx: Denied  Exam Findings  Physical Exam: Reviewed and agree with the physical exam findings conducted by the medical provider Vital Signs:  Temp:  [97.8 F (36.6 C)] 97.8 F (36.6 C) (01/23 1400) Pulse Rate:  [103] 103 (01/23 1400) Resp:  [16] 16 (01/23 1400) BP: (122)/(92) 122/92 (01/23 1400) SpO2:  [97 %] 97 % (01/23 1400) Weight:  [102.5 kg] 102.5 kg (01/23 1401) Blood pressure (!) 122/92, pulse (!) 103, temperature 97.8 F (36.6 C), temperature source Oral, resp. rate 16, weight 102.5 kg, SpO2 97%. Body mass index is 32.42 kg/m.    Mental Status Exam: General Appearance: Fairly Groomed  Orientation:  Full (Time, Place, and Person)  Memory:  Fair  Concentration:  Concentration: Fair  Recall:  Fair  Attention  Fair  Eye Contact:  Fair  Speech:  Slow and Slurred  Language:  Fair  Volume:  Normal  Mood: angry  Affect:  Congruent  Thought Process:  Coherent  Thought Content:  Logical  Suicidal Thoughts:  Yes.  with intent/plan  Homicidal Thoughts:  Yes.  without intent/plan  Judgement:  Poor  Insight:  Shallow  Psychomotor Activity:  Normal  Akathisia:  No  Fund of Knowledge:  Poor      Assets:  Architect Housing Social Support  Cognition:  Impaired  ADL's:  Intact  AIMS (if indicated):        Other History   These have been pulled in through the EMR, reviewed, and updated if appropriate.  Family History:  The patient's family history is not on file.  Medical History: Past Medical History:  Diagnosis Date   Allergy    Asthma    Autism    Blood transfusion without reported diagnosis    Seizures (HCC)    TBI (traumatic brain injury) (HCC)     Surgical History: Past Surgical History:  Procedure Laterality Date   ANKLE SURGERY     APPENDECTOMY     BRAIN SURGERY     LUNG  SURGERY       Medications:  Current Medications[1]  Allergies: Allergies[2]  A. Claudene, NP This note was created using Scientist, clinical (histocompatibility and immunogenetics). Please excuse any inadvertent transcription errors. Case was discussed with supervising physician Dr. Jadapalle who is agreeable with current plan.       [1] No current facility-administered medications for this encounter.  Current Outpatient Medications:    acetaminophen  (TYLENOL ) 500 MG tablet, Take 1 tablet (500 mg total) by mouth every 8 (eight) hours as needed for mild pain (pain score 1-3), moderate pain (pain score 4-6), fever or headache., Disp: , Rfl:    albuterol  (VENTOLIN  HFA) 108 (90 Base) MCG/ACT inhaler, Inhale 2 puffs into the lungs every 6 (six) hours as needed for wheezing or shortness of breath., Disp: , Rfl:    ALLERGY RELIEF CETIRIZINE 10 MG tablet, @ TAKE 1 TABLET BY MOUTH ONCE DAILY, Disp: 28 tablet, Rfl: 10   Cholecalciferol  (VITAMIN D3) 50 MCG (2000 UT) TABS, TAKE 1 TABLET BY MOUTH EVERY MORNING FOR VIT DAILY INSUFFICIENCY *TAKE WITH FOOD*, Disp: 28 tablet, Rfl: 10   cloZAPine  (CLOZARIL ) 100 MG tablet, Take 3 tablets (300 mg total) by mouth daily. Take for total 300 mg once  daily, Disp: , Rfl:    cyanocobalamin  (VITAMIN B12) 1000 MCG tablet, TAKE 1 TABLET BY MOUTH ONCE DAILY, Disp: 90 tablet, Rfl: 1   divalproex  (DEPAKOTE ) 250 MG DR tablet, Take one tablet every night along with the 500mg  divalproex  (Patient not taking: Reported on 05/10/2024), Disp: 30 tablet, Rfl: 2   divalproex  (DEPAKOTE ) 500 MG DR tablet, Take 1 tablet (500 mg total) by mouth 2 (two) times daily., Disp: 60 tablet, Rfl: 2   docusate sodium  (COLACE) 100 MG capsule, Take 1 capsule (100 mg total) by mouth daily., Disp: 30 capsule, Rfl: 11   famotidine  (PEPCID ) 20 MG tablet, TAKE 1 TABLET BY MOUTH ONCE DAILY, Disp: 13 tablet, Rfl: 10   fenofibrate  (TRICOR ) 48 MG tablet, TAKE 1 TABLET BY MOUTH EVERY MORNING *DO NOT CRUSH OR CHEW*, Disp: 28 tablet, Rfl: 10    hydroxypropyl methylcellulose / hypromellose (ISOPTO TEARS / GONIOVISC) 2.5 % ophthalmic solution, Place 2 drops into both eyes 2 (two) times daily., Disp: 15 mL, Rfl: 4   hydrOXYzine  (ATARAX ) 50 MG tablet, Take 25-50 mg by mouth 2 (two) times daily as needed for anxiety., Disp: , Rfl:    ibuprofen (ADVIL) 400 MG tablet, Take 1.5 tablets (600 mg total) by mouth every 8 (eight) hours as needed., Disp: , Rfl:    Incontinence Supply Disposable (PER-FIT UNDERWEAR EXTRA LARGE) MISC, USE AS DIRECTED TO WEAR OVERNIGHT FOR  URINARY INCONTINENCE, Disp: 28 each, Rfl: 10   ipratropium-albuterol  (DUONEB) 0.5-2.5 (3) MG/3ML SOLN, Take 3 mLs by nebulization every 6 (six) hours as needed., Disp: 360 mL, Rfl: 2   lacosamide  (VIMPAT ) 200 MG TABS tablet, Take 1 tablet (200 mg total) by mouth 2 (two) times daily., Disp: 60 tablet, Rfl: 2   levothyroxine  (SYNTHROID ) 25 MCG tablet, TAKE 1 TABLET BY MOUTH ONCE DAILY *TAKE ON AN EMPTY STOMACH*, Disp: 28 tablet, Rfl: 10   Melatonin 10 MG TABS, Take 10 mg by mouth at bedtime as needed (Sleep)., Disp: , Rfl:    Multiple Vitamin (THEREMS PO), Take 1 tablet by mouth daily., Disp: , Rfl:    Multiple Vitamin (THEREMS) TABS, TAKE 1 TABLET BY MOUTH EVERY MORNING FOR NUTRITIONAL SUPPLEMENT, Disp: 28 tablet, Rfl: 10   omeprazole (PRILOSEC) 20 MG capsule, Take 20 mg by mouth daily., Disp: , Rfl:    paliperidone  (INVEGA ) 6 MG 24 hr tablet, Take 12 mg by mouth every morning., Disp: , Rfl:    propranolol  (INDERAL ) 10 MG tablet, TAKE 1 TABLET BY MOUTH ONCE DAILY, Disp: 28 tablet, Rfl: 10   sertraline  (ZOLOFT ) 100 MG tablet, Take 150 mg by mouth at bedtime., Disp: , Rfl:    THERATEARS NIGHTTIME 1 % GEL, PLACE 2 DROPS INTO BOTH EYES TWICE DAILY *WAIT 3-5 MINUTES BETWEEN 2 EYE MEDS*, Disp: 30 each, Rfl: 11   Vibegron  (GEMTESA ) 75 MG TABS, Take 1 tablet (75 mg total) by mouth daily. (Patient not taking: Reported on 05/10/2024), Disp: 30 tablet, Rfl: 2   Vibegron  (GEMTESA ) 75 MG TABS, Take 1  tablet (75 mg total) by mouth daily., Disp: 30 tablet, Rfl: 6   Vitamin E (E-OIL) 30000 units OIL, APPLY TO SCALP ONCE DAILY **EXTERNAL USE ONLY**, Disp: 118 mL, Rfl: 10   zonisamide  (ZONEGRAN ) 50 MG capsule, Take 1 capsule (50 mg total) by mouth daily., Disp: 30 capsule, Rfl: 2 [2]  Allergies Allergen Reactions   Antihistamines, Diphenhydramine-Type Other (See Comments)    UNK reaction   Nsaids Other (See Comments)    Unk reaction  Prednisone Other (See Comments)    UNK reaction   Aspirin Rash   Penicillins Rash

## 2024-05-28 NOTE — ED Notes (Signed)
 Patient oriented to unit, verbalized understanding

## 2024-05-28 NOTE — ED Notes (Signed)
 Provided dinner tray

## 2024-05-28 NOTE — ED Notes (Addendum)
 Pt given night time snack and used the restroom

## 2024-05-28 NOTE — ED Notes (Signed)
 Patient belongings: Academic Librarian Black coat Tan pants Black belt watch Grey socks (pair) Glasses (remain with patient) Pair of grey underware

## 2024-05-28 NOTE — ED Triage Notes (Signed)
 ARrives c/o SI x 2-3 weeks.  AAOx3. Calm and cooperative. NAD  Lives at [facility].

## 2024-05-28 NOTE — BH Assessment (Signed)
 Comprehensive Clinical Assessment (CCA) Note  05/28/2024 Eddie Berry [id number]  Chief Complaint:  Chief Complaint  Patient presents with   Suicidal   Visit Diagnosis: Major Depressive   Eddie Berry is a 38 year old male who presents from Group [phone]) due to recent changes in his mood and behaviors. Patient have been in the facility for approximately a year and the changes started about a week ago. Last night, he left the group home with only a short sleeve shirt and shorts. He got a soda and cigarettes, which he wasn't suppose to have. Today, while he was at school, he stole a soda, which is unlike him. The staff talked with the patient and did not press charges. He has also been irritable and easily annoyed.  Per the patient, he is upset with his group home staff and want to end his life. He states he will overdose on medications. Patient denies HI and AV/H.  CCA Screening, Triage and Referral (STR)  Patient Reported Information How did you hear about us ? Self  What Is the Reason for Your Visit/Call Today? Patient reporting SI with plan to overdose  How Long Has This Been Causing You Problems? 1 wk - 1 month  What Do You Feel Would Help You the Most Today? Treatment for Depression or other mood problem   Have You Recently Had Any Thoughts About Hurting Yourself? Yes  Are You Planning to Commit Suicide/Harm Yourself At This time? Yes   Flowsheet Row ED from 05/28/2024 in [facility] Emergency Department at [facility] ED from 12/20/2023 in [facility] Emergency Department at [facility] ED from 11/05/2023 in [facility] Emergency Department at [facility]  C-SSRS RISK CATEGORY High Risk No Risk No Risk    Have you Recently Had Thoughts About Hurting Someone [place]? No  Are You Planning to Harm Someone at This Time? No  Explanation: Pt endorsed having passive SI, explaining that he feels that he needs to sacrifice himself as Christ  did. Pt alluded to trying to starve himself; but later denied intent.   Have You Used Any Alcohol  or Drugs in the Past 24 Hours? No  How Long Ago Did You Use Drugs or Alcohol ? No data recorded What Did You Use and How Much? No data recorded  Do You Currently Have a Therapist/Psychiatrist? Yes  Name of Therapist/Psychiatrist: Name of Therapist/Psychiatrist: Via Group Home   Have You Been Recently Discharged From Any Office Practice or Programs? No  Explanation of Discharge From Practice/Program: No data recorded    CCA Screening Triage Referral Assessment Type of Contact: Face-to-Face  Telemedicine Service Delivery:   Is this Initial or Reassessment?   Date Telepsych consult ordered in [facility]:    Time Telepsych consult ordered in [facility]:    Location of Assessment: [facility] ED  Provider Location: [facility] ED   Collateral Involvement: None   Does Patient Have a Court Appointed Legal Guardian? Yes Other:  Legal Guardian Contact Information: Legal Guardian at bedside at time of discharge  Copy of Legal Guardianship Form: No - copy requested  Legal Guardian Notified of Arrival: Successfully notified  Legal Guardian Notified of Pending Discharge: Successfully notified  If Minor and Not Living with Parent(s), Who has Custody? n/a  Is CPS involved or ever been involved? Never  Is APS involved or ever been involved? Never   Patient Determined To Be At Risk for Harm To Self or Others Based on Review of Patient Reported Information or Presenting  Complaint? No  Method: Plan without intent (Pt alluded to trying to starve himself; but later denied intent.)  Availability of Means: No access or NA  Intent: Vague intent or NA  Notification Required: No need or identified person  Additional Information for Danger to Others Potential: -- (n/a)  Additional Comments for Danger to Others Potential: n/a  Are There Guns or Other Weapons in Your Home? No  Types of Guns/Weapons: n/a  Are  These Weapons Safely Secured?                            Yes  Who Could Verify You Are Able To Have These Secured: Pt lives in a group home setting.  Do You Have any Outstanding Charges, Pending Court Dates, Parole/Probation? none reported  Contacted To Inform of Risk of Harm To Self or Others: Other: Comment    Does Patient Present under Involuntary Commitment? Yes    [place] of Residence: [place]   Patient Currently Receiving the Following Services: Group Home; Medication Management   Determination of Need: Emergent (2 hours)   Options For Referral: Inpatient Hospitalization; ED Visit     CCA Biopsychosocial Patient Reported Schizophrenia/Schizoaffective Diagnosis in Past: No   Strengths: Patient have stable housing, some insight and have a support system.   Mental Health Symptoms Depression:  Difficulty Concentrating; Change in energy/activity   Duration of Depressive symptoms: Duration of Depressive Symptoms: Greater than two weeks   Mania:  Racing thoughts; Increased Energy   Anxiety:   Restlessness   Psychosis:  None   Duration of Psychotic symptoms:    Trauma:  N/A   Obsessions:  N/A   Compulsions:  N/A   Inattention:  N/A   Hyperactivity/Impulsivity:  N/A   Oppositional/Defiant Behaviors:  N/A   Emotional Irregularity:  N/A   Other Mood/Personality Symptoms:  No data recorded   Mental Status Exam Appearance and self-care  Stature:  Average   Weight:  Average weight   Clothing:  Casual   Grooming:  Normal   Cosmetic use:  None   Posture/gait:  Normal   Motor activity:  -- (Within normal range)   Sensorium  Attention:  Distractible   Concentration:  Focuses on irrelevancies   Orientation:  X5   Recall/memory:  Normal   Affect and Mood  Affect:  Full Range   Mood:  Anxious; Depressed   Relating  Eye contact:  Normal   Facial expression:  Depressed; Responsive   Attitude toward examiner:  Cooperative   Thought and  Language  Speech flow: Clear and Coherent; Normal   Thought content:  Appropriate to Mood and Circumstances   Preoccupation:  None   Hallucinations:  None   Organization:  Intact; Logical   Company Secretary of Knowledge:  Fair   Intelligence:  Average   Abstraction:  Normal   Judgement:  Fair   Dance Movement Psychotherapist:  Distorted   Insight:  Poor   Decision Making:  Impulsive   Social Functioning  Social Maturity:  Isolates   Social Judgement:  Naive   Stress  Stressors:  Transitions   Coping Ability:  Normal   Skill Deficits:  Decision making   Supports:  Friends/Service system     Religion: Religion/Spirituality Are You A Religious Person?: No  Leisure/Recreation: Leisure / Recreation Do You Have Hobbies?: No  Exercise/Diet: Exercise/Diet Do You Exercise?: No Have You Gained or Lost A Significant Amount of Weight in the Past  Six Months?: No Do You Follow a Special Diet?: No Do You Have Any Trouble Sleeping?: No   CCA Employment/Education Employment/Work Situation: Employment / Work Situation Employment Situation: On disability Why is Patient on Disability: Mental Health Patient's Job has Been Impacted by Current Illness: No  Education: Education Is Patient Currently Attending School?: No Did You Have An Individualized Education Program (IIEP): No Did You Have Any Difficulty At School?: No Patient's Education Has Been Impacted by Current Illness: No   CCA Family/Childhood History Family and Relationship History: Family history Marital status: Single Does patient have children?: No  Childhood History:  Childhood History By whom was/is the patient raised?: Mother Did patient suffer any verbal/emotional/physical/sexual abuse as a child?: No Did patient suffer from severe childhood neglect?: No Has patient ever been sexually abused/assaulted/raped as an adolescent or adult?: No Was the patient ever a victim of a crime or a disaster?:  No Witnessed domestic violence?: No Has patient been affected by domestic violence as an adult?: No   CCA Substance Use Alcohol /Drug Use: Alcohol  / Drug Use Pain Medications: See PTA Prescriptions: See PTA Over the Counter: See PTA History of alcohol  / drug use?: No history of alcohol  / drug abuse Longest period of sobriety (when/how long): n/a    ASAM's:  Six Dimensions of Multidimensional Assessment  Dimension 1:  Acute Intoxication and/or Withdrawal Potential:      Dimension 2:  Biomedical Conditions and Complications:      Dimension 3:  Emotional, Behavioral, or Cognitive Conditions and Complications:     Dimension 4:  Readiness to Change:     Dimension 5:  Relapse, Continued use, or Continued Problem Potential:     Dimension 6:  Recovery/Living Environment:     ASAM Severity Score:    ASAM Recommended Level of Treatment:     Substance use Disorder (SUD)    Recommendations for Services/Supports/Treatments:    Disposition Recommendation per psychiatric provider: Inpatient Treatment   DSM5 Diagnoses: Patient Active Problem List   Diagnosis Date Noted   Lives in group home 11/15/2023   Frequency of urination and polyuria 11/15/2023   High risk medication use 11/15/2023   Seizure-like activity (HCC) 09/22/2023   Seizure disorder (HCC) 09/18/2023   Observed seizure-like activity (HCC) 08/22/2023   Obesity (BMI 30.0-34.9) 08/04/2023   Chronic constipation 08/04/2023   Vitamin D  deficiency 08/04/2023   Chronic dryness of both eyes 08/04/2023   Gastroesophageal reflux disease 08/04/2023   Tobacco use 05/15/2022   Hypoglycemia 05/15/2022   Medication management 02/06/2022   Lethargy 02/05/2022   Acute respiratory failure with hypoxia (HCC) 02/04/2022   CAP (community acquired pneumonia) 02/03/2022   Hypothyroidism 02/03/2022   Acute pulmonary embolism (HCC) 02/03/2022   Excessive daytime sleepiness 12/21/2021   Seizure (HCC) 06/16/2020   Mood disorder  06/16/2020   TBI (traumatic brain injury) (HCC)     Referrals to Alternative Service(s): Referred to Alternative Service(s):   Place:   Date:   Time:    Referred to Alternative Service(s):   Place:   Date:   Time:    Referred to Alternative Service(s):   Place:   Date:   Time:    Referred to Alternative Service(s):   Place:   Date:   Time:     Kiki DOROTHA Barge MS, LCAS, [facility] Therapeutic Triage Specialist 05/28/2024 6:40 PM

## 2024-05-28 NOTE — ED Provider Notes (Signed)
 "  [facility] Provider Note    Event Date/Time   First MD Initiated Contact with Patient 05/28/24 1508     (approximate)   History   Chief Complaint: Suicidal   HPI  Eddie Berry is a 39 y.o. male with history of TBI, seizure disorder, bipolar disorder who comes ED complaining of suicidal ideation for the past 2 to 3 weeks.  Reports also having auditory hallucinations which tell him to kill himself and kill his roommate.  Denies pain or injuries.  Denies any recent self-injurious behavior.     Physical Exam   Triage Vital Signs: ED Triage Vitals  Encounter Vitals Group     BP 05/28/24 1400 (!) 122/92     Girls Systolic BP Percentile --      Girls Diastolic BP Percentile --      Boys Systolic BP Percentile --      Boys Diastolic BP Percentile --      Pulse Rate 05/28/24 1400 (!) 103     Resp 05/28/24 1400 16     Temp 05/28/24 1400 97.8 F (36.6 C)     Temp Source 05/28/24 1400 Oral     SpO2 05/28/24 1400 97 %     Weight 05/28/24 1401 225 lb 15.5 oz (102.5 kg)     Height --      Head Circumference --      Peak Flow --      Pain Score 05/28/24 1401 0     Pain Loc --      Pain Education --      Exclude from Growth Chart --     Most recent vital signs: Vitals:   05/28/24 1400 05/28/24 2135  BP: (!) 122/92 117/79  Pulse: (!) 103 92  Resp: 16 17  Temp: 97.8 F (36.6 C) 97.8 F (36.6 C)  SpO2: 97% 96%    General: Awake, no distress. CV:  Good peripheral perfusion.  Resp:  Normal effort.  Abd:  No distention.  Other:  No wounds   ED Results / Procedures / Treatments   Labs (all labs ordered are listed, but only abnormal results are displayed) Labs Reviewed  COMPREHENSIVE METABOLIC PANEL WITH GFR - Abnormal; Notable for the following components:      Result Value   Sodium 146 (*)    Glucose, Bld 118 (*)    AST 69 (*)    All other components within normal limits  ETHANOL  CBC  URINE DRUG SCREEN  TSH      EKG    RADIOLOGY    PROCEDURES:  Procedures   MEDICATIONS ORDERED IN ED: Medications  loratadine  (CLARITIN ) tablet 10 mg (has no administration in time range)  benztropine (COGENTIN) tablet 0.5 mg (has no administration in time range)  cloZAPine  (CLOZARIL ) tablet 300 mg (has no administration in time range)  divalproex  (DEPAKOTE ) DR tablet 500 mg (has no administration in time range)  docusate sodium  (COLACE) capsule 100 mg (has no administration in time range)  famotidine  (PEPCID ) tablet 20 mg (has no administration in time range)  fenofibrate  tablet 54 mg (has no administration in time range)  lacosamide  (VIMPAT ) tablet 200 mg (has no administration in time range)  levothyroxine  (SYNTHROID ) tablet 25 mcg (has no administration in time range)  Melatonin TABS 10 mg (has no administration in time range)  paliperidone  (INVEGA ) 24 hr tablet 12 mg (has no administration in time range)  sertraline  (ZOLOFT ) tablet 150 mg (has no administration in time  range)  mirabegron  ER (MYRBETRIQ ) tablet 25 mg (has no administration in time range)  zonisamide  (ZONEGRAN ) capsule 50 mg (has no administration in time range)     IMPRESSION / MDM / ASSESSMENT AND PLAN / ED COURSE  I reviewed the triage vital signs and the nursing notes.  Patient's presentation is most consistent with severe exacerbation of chronic illness.  Presents with suicidal ideation and command auditory hallucinations.  History of psychiatric hospitalization for similar symptoms.  He is currently calm.  Medically stable.  Has a guardian.  Will consult psychiatry for recommendations.  The patient has been placed in psychiatric observation due to the need to provide a safe environment for the patient while obtaining psychiatric consultation and evaluation, as well as ongoing medical and medication management to treat the patient's condition.  The patient has not been placed under full IVC at this time.      FINAL  CLINICAL IMPRESSION(S) / ED DIAGNOSES   Final diagnoses:  Suicidal thoughts     Rx / DC Orders   ED Discharge Orders     None        Note:  This document was prepared using Dragon voice recognition software and may include unintentional dictation errors.   Viviann Pastor, MD 05/28/24 2139  "

## 2024-05-29 DIAGNOSIS — R45851 Suicidal ideations: Secondary | ICD-10-CM

## 2024-05-29 NOTE — ED Notes (Signed)
 Breakfast and juice was provide to patient.

## 2024-05-29 NOTE — ED Notes (Signed)
 Pt taking shower. Pt was given hygiene items and the following, 1 clean top, 1 clean bottom, with 1 pair of disposable underwear.  Pt changed out into clean clothing.  Staff disposed of all shower supplies.

## 2024-05-29 NOTE — ED Notes (Signed)
 Pt discharged at this time. RN reviewed discharge instructions with pt. Pt verbalized understanding. Vital signs taken. RR even and unlabored. Pt denies any questions or needs at this time. Pt ambulatory at discharge.

## 2024-05-29 NOTE — Consult Note (Signed)
 [facility] Psychiatric Consult Follow-up  Patient Name: .Eddie Berry  MRN: [id number]  DOB: Jan 05, 1986  Consult Order details:  Orders (From admission, onward)     Start     Ordered   05/28/24 1538  CONSULT TO CALL ACT TEAM       Ordering Provider: Viviann Pastor, MD  Provider:  (Not yet assigned)  Question:  Reason for Consult?  Answer:  Psych consult   05/28/24 1537   05/28/24 1538  IP CONSULT TO PSYCHIATRY       Ordering Provider: Viviann Pastor, MD  Provider:  (Not yet assigned)  Question:  Reason for consult:  Answer:  Medication management   05/28/24 1537             Mode of Visit: In person    Psychiatry Consult Evaluation  Service Date: May 29, 2024 LOS:  LOS: 0 days  Chief Complaint suicidal ideations  Primary Psychiatric Diagnoses  TBI   Assessment  CLINICAL DECISION MAKING:  Eddie Berry is a 39 y.o. male admitted: Presented to the EDfor 05/28/2024  2:52 PM for suicidal ideations. He carries the psychiatric diagnoses of TBI and has a past medical history of  asthma, autism, seizures, and TBI.  05/30/24: Patient is reassessed in the [facility] and is resting in bed in no acute distress. He answers questions simplistically and states that he does not have suicidal thoughts today. He denies depression and has been calm and cooperative since his arrival. Patient has not required PRN medications for behavioral reasons. Staff reports that he has been attending to his ADLs without difficulty. Patient states that he misses the [facility] and they miss him and would like to return. Leonor RN contacted [facility] staff and reports that they are concerned that patient is malingering and can return to the group home. They have no concerns regarding the ability to provide a safe environment and patient has no concerns of returning to [facility]. He denies suicidal or homicidal ideation, or hallucinations at this time. He is safe for discharge from a psychiatric  standpoint.    05/28/24: Based on assessment, patient is recommended for inpatient psychiatric admission after medical clearance due to active suicidal ideations with recent suicide attempts including laying in road and pushing others hoping to be attacked, command-type thoughts to harm self and others, medication nonadherence, inability to meet basic needs such as eating, and concerning statements regarding housemate. Patient requires acute psychiatric stabilization in a safe environment, medication management and monitoring for compliance, assessment and treatment of mood symptoms, and establishment of safe discharge plan with appropriate level of care and support.  This provider attempted to contact legal guardian at this time with no answer. HIPAA compliant voice message left for return call. TTS reaching out to group home for collateral information. Plan was discussed with EDP Viviann at this time. Patient is agreeable to signing voluntarily at this time. However, if patient attempts to elope, recommend involuntary commitment due to the above safety concerns including active suicidal ideations with recent attempts, thoughts to harm housemate, medication nonadherence, and need to obtain collateral information from group home and contact with legal guardian     Diagnoses:  Active Hospital problems: Active Problems:   * No active hospital problems. *    Plan   ## Psychiatric Medication Recommendations:  Continue current medications  ## Medical Decision Making Capacity: Patient has a guardian and has thus been adjudicated incompetent; please involve patients guardian in medical decision  making   ## Disposition: Return to [facility]  ## Behavioral / Environmental: -To minimize splitting of staff, assign one staff person to communicate all information from the team when feasible.    ## Safety and Observation Level:  - Based on my clinical evaluation, I estimate the patient to be at  LOW risk of self harm in the current setting. - At this time, we recommend  routine. This decision is based on my review of the chart including patient's history and current presentation, interview of the patient, mental status examination, and consideration of suicide risk including evaluating suicidal ideation, plan, intent, suicidal or self-harm behaviors, risk factors, and protective factors. This judgment is based on our ability to directly address suicide risk, implement suicide prevention strategies, and develop a safety plan while the patient is in the clinical setting. Please contact our team if there is a concern that risk level has changed.  CSSR Risk Category:C-SSRS RISK CATEGORY: High Risk  Suicide Risk Assessment: Patient has following modifiable risk factors for suicide: recklessness, which we are addressing by ensuring a safe environment, therapeutic communication, and medication compliance. Patient has following non-modifiable or demographic risk factors for suicide: history of self harm behavior and psychiatric hospitalization Patient has the following protective factors against suicide: Access to outpatient mental health care and Supportive friends  Thank you for this consult request. Recommendations have been communicated to the primary team.  We will recommend discharge to [facility] staff at this time.   Aminah Zabawa B Raychelle Hudman, NP       History of Present Illness  Relevant Aspects of Hospital ED   Patient Report:   05/30/24: Patient is reassessed in the [facility] and is resting in bed in no acute distress. He answers questions simplistically and states that he does not have suicidal thoughts today. He denies depression and has been calm and cooperative since his arrival. Patient has not required PRN medications for behavioral reasons. Staff reports that he has been attending to his ADLs without difficulty. Patient states that he misses the [facility] and they miss him and would like to  return. Leonor RN contacted [facility] staff and reports that they are concerned that patient is malingering and can return to the group home. They have no concerns regarding the ability to provide a safe environment and patient has no concerns of returning to [facility]. He denies suicidal or homicidal ideation, or hallucinations at this time. He is safe for discharge from a psychiatric standpoint.    Psychiatric and Social History  Psychiatric History:  Information collected from Patient/chart  Prev Dx/Sx: Autism, TBI Current Psych Provider: Unknown Home Meds (current): See HPI Previous Med Trials: Unknown Therapy: Unknown  Prior Psych Hospitalization: Unknown  Prior Suicide attempts/Self Harm: endorses Prior Violence: hx  Family Psych History: Unknown Family Hx suicide: Unknown  Social History:   Educational Hx: Unknown Occupational Hx: receives SSI Legal Hx: Unknown Living Situation: Group home Spiritual Hx: Unknown Access to weapons/lethal means: denies   Substance History Alcohol : Unknown  Type of alcohol  Unknown Last Drink Unknown Number of drinks per day Unknown History of alcohol  withdrawal seizures Unknown History of DT's Unknown Tobacco: Unknown Illicit drugs: Unknown Prescription drug abuse: Unknown Rehab hx: Unknown  Exam Findings  Physical Exam: Reviewed and agree with the physical exam findings conducted by the medical provider Vital Signs:  Temp:  [97.8 F (36.6 C)-98.5 F (36.9 C)] 98.5 F (36.9 C) (01/24 0947) Pulse Rate:  [86-103]  86 (01/24 0947) Resp:  [16-18] 18 (01/24 0947) BP: (116-122)/(76-92) 116/76 (01/24 0947) SpO2:  [96 %-97 %] 96 % (01/24 0947) Weight:  [102.5 kg] 102.5 kg (01/23 1401) Blood pressure 116/76, pulse 86, temperature 98.5 F (36.9 C), temperature source Oral, resp. rate 18, weight 102.5 kg, SpO2 96%. Body mass index is 32.42 kg/m.    Mental Status Exam: General Appearance: Fairly Groomed  Orientation:   Other:  oriented to place and self  Memory:  Immediate;   Fair Recent;   Fair Remote;   Fair  Concentration:  Concentration: Fair and Attention Span: Fair  Recall:  Fair  Attention  Fair  Eye Contact:  Minimal  Speech:  his baseline  Language:  Good  Volume:  Normal  Mood: good  Affect:  Appropriate  Thought Process:  Coherent  Thought Content:  Negative  Suicidal Thoughts:  No  Homicidal Thoughts:  No  Judgement:  Poor  Insight:  Shallow  Psychomotor Activity:  Negative  Akathisia:  No  Fund of Knowledge:  Fair      Assets:  Desire for Improvement Housing Social Support  Cognition:  WNL  ADL's:  Intact  AIMS (if indicated):        Other History   These have been pulled in through the EMR, reviewed, and updated if appropriate.  Family History:  The patient's family history is not on file.  Medical History: Past Medical History:  Diagnosis Date   Allergy    Asthma    Autism    Blood transfusion without reported diagnosis    Seizures (HCC)    TBI (traumatic brain injury) (HCC)     Surgical History: Past Surgical History:  Procedure Laterality Date   ANKLE SURGERY     APPENDECTOMY     BRAIN SURGERY     LUNG SURGERY       Medications:  Current Medications[1]  Allergies: Allergies[2]  Trula Frede B Mionna Advincula, NP     [1]  Current Facility-Administered Medications:    benztropine  (COGENTIN ) tablet 0.5 mg, 0.5 mg, Oral, QHS, Viviann Pastor, MD, 0.5 mg at 05/28/24 2200   cloZAPine  (CLOZARIL ) tablet 300 mg, 300 mg, Oral, Daily, Viviann Pastor, MD, 300 mg at 05/29/24 1026   divalproex  (DEPAKOTE ) DR tablet 500 mg, 500 mg, Oral, BID, Viviann Pastor, MD, 500 mg at 05/29/24 1027   docusate sodium  (COLACE) capsule 100 mg, 100 mg, Oral, Daily, Viviann Pastor, MD, 100 mg at 05/29/24 1026   famotidine  (PEPCID ) tablet 20 mg, 20 mg, Oral, Daily, Viviann Pastor, MD, 20 mg at 05/29/24 1026   fenofibrate  tablet 54 mg, 54 mg, Oral, Daily, Viviann Pastor,  MD, 54 mg at 05/29/24 1026   lacosamide  (VIMPAT ) tablet 200 mg, 200 mg, Oral, BID, Viviann Pastor, MD, 200 mg at 05/29/24 1028   levothyroxine  (SYNTHROID ) tablet 25 mcg, 25 mcg, Oral, Q0600, Viviann Pastor, MD, 25 mcg at 05/29/24 1026   loratadine  (CLARITIN ) tablet 10 mg, 10 mg, Oral, Daily, Viviann Pastor, MD, 10 mg at 05/29/24 1026   melatonin tablet 10 mg, 10 mg, Oral, QHS PRN, Viviann Pastor, MD, 10 mg at 05/28/24 2200   mirabegron  ER (MYRBETRIQ ) tablet 25 mg, 25 mg, Oral, Daily, Viviann Pastor, MD, 25 mg at 05/29/24 1026   paliperidone  (INVEGA ) 24 hr tablet 12 mg, 12 mg, Oral, q morning, Viviann Pastor, MD, 12 mg at 05/29/24 1026   propranolol  (INDERAL ) tablet 10 mg, 10 mg, Oral, Daily, Ward, Kristen N, DO, 10 mg at 05/29/24 1026   sertraline  (  ZOLOFT ) tablet 150 mg, 150 mg, Oral, QHS, Viviann Pastor, MD, 150 mg at 05/28/24 2200   zonisamide  (ZONEGRAN ) capsule 25 mg, 25 mg, Oral, BID, Viviann Pastor, MD, 25 mg at 05/29/24 1026  Current Outpatient Medications:    acetaminophen  (TYLENOL ) 500 MG tablet, Take 1 tablet (500 mg total) by mouth every 8 (eight) hours as needed for mild pain (pain score 1-3), moderate pain (pain score 4-6), fever or headache., Disp: , Rfl:    albuterol  (VENTOLIN  HFA) 108 (90 Base) MCG/ACT inhaler, Inhale 2 puffs into the lungs every 6 (six) hours as needed for wheezing or shortness of breath., Disp: , Rfl:    ALLERGY RELIEF CETIRIZINE 10 MG tablet, @ TAKE 1 TABLET BY MOUTH ONCE DAILY, Disp: 28 tablet, Rfl: 10   benztropine  (COGENTIN ) 0.5 MG tablet, Take 0.5 mg by mouth daily., Disp: , Rfl:    Cholecalciferol  (VITAMIN D3) 50 MCG (2000 UT) TABS, TAKE 1 TABLET BY MOUTH EVERY MORNING FOR VIT DAILY INSUFFICIENCY *TAKE WITH FOOD*, Disp: 28 tablet, Rfl: 10   cloZAPine  (CLOZARIL ) 100 MG tablet, Take 3 tablets (300 mg total) by mouth daily. Take for total 300 mg once daily, Disp: , Rfl:    clozapine  (CLOZARIL ) 200 MG tablet, Take 200 mg by mouth at  bedtime., Disp: , Rfl:    cyanocobalamin  (VITAMIN B12) 1000 MCG tablet, TAKE 1 TABLET BY MOUTH ONCE DAILY, Disp: 90 tablet, Rfl: 1   divalproex  (DEPAKOTE ) 500 MG DR tablet, Take 500 mg by mouth 2 (two) times daily., Disp: , Rfl:    docusate sodium  (COLACE) 100 MG capsule, Take 1 capsule (100 mg total) by mouth daily., Disp: 30 capsule, Rfl: 11   famotidine  (PEPCID ) 20 MG tablet, TAKE 1 TABLET BY MOUTH ONCE DAILY, Disp: 13 tablet, Rfl: 10   fenofibrate  (TRICOR ) 48 MG tablet, TAKE 1 TABLET BY MOUTH EVERY MORNING *DO NOT CRUSH OR CHEW*, Disp: 28 tablet, Rfl: 10   fluticasone  (FLONASE ) 50 MCG/ACT nasal spray, Place 1 spray into both nostrils daily., Disp: , Rfl:    hydrOXYzine  (ATARAX ) 50 MG tablet, Take 25-50 mg by mouth 2 (two) times daily as needed for anxiety., Disp: , Rfl:    ibuprofen (ADVIL) 400 MG tablet, Take 1.5 tablets (600 mg total) by mouth every 8 (eight) hours as needed., Disp: , Rfl:    ipratropium-albuterol  (DUONEB) 0.5-2.5 (3) MG/3ML SOLN, Take 3 mLs by nebulization every 6 (six) hours as needed., Disp: 360 mL, Rfl: 2   lacosamide  (VIMPAT ) 200 MG TABS tablet, Take 1 tablet (200 mg total) by mouth 2 (two) times daily., Disp: 60 tablet, Rfl: 2   levothyroxine  (SYNTHROID ) 25 MCG tablet, TAKE 1 TABLET BY MOUTH ONCE DAILY *TAKE ON AN EMPTY STOMACH*, Disp: 28 tablet, Rfl: 10   Melatonin 10 MG TABS, Take 10 mg by mouth at bedtime as needed (Sleep)., Disp: , Rfl:    Multiple Vitamin (THEREMS PO), Take 1 tablet by mouth daily., Disp: , Rfl:    Multiple Vitamin (THEREMS) TABS, TAKE 1 TABLET BY MOUTH EVERY MORNING FOR NUTRITIONAL SUPPLEMENT, Disp: 28 tablet, Rfl: 10   omeprazole (PRILOSEC) 20 MG capsule, Take 20 mg by mouth daily., Disp: , Rfl:    paliperidone  (INVEGA ) 6 MG 24 hr tablet, Take 12 mg by mouth every morning., Disp: , Rfl:    propranolol  (INDERAL ) 10 MG tablet, TAKE 1 TABLET BY MOUTH ONCE DAILY, Disp: 28 tablet, Rfl: 10   sertraline  (ZOLOFT ) 100 MG tablet, Take 150 mg by mouth at  bedtime., Disp: ,  Rfl:    THERATEARS NIGHTTIME 1 % GEL, PLACE 2 DROPS INTO BOTH EYES TWICE DAILY *WAIT 3-5 MINUTES BETWEEN 2 EYE MEDS*, Disp: 30 each, Rfl: 11   Vibegron  (GEMTESA ) 75 MG TABS, Take 1 tablet (75 mg total) by mouth daily., Disp: 30 tablet, Rfl: 2   Vitamin E (E-OIL) 30000 units OIL, APPLY TO SCALP ONCE DAILY **EXTERNAL USE ONLY**, Disp: 118 mL, Rfl: 10   zonisamide  (ZONEGRAN ) 25 MG capsule, Take 25 mg by mouth 2 (two) times daily., Disp: , Rfl:    divalproex  (DEPAKOTE ) 250 MG DR tablet, Take one tablet every night along with the 500mg  divalproex  (Patient not taking: Reported on 02/11/2024), Disp: 30 tablet, Rfl: 2   divalproex  (DEPAKOTE ) 500 MG DR tablet, Take 1 tablet (500 mg total) by mouth 2 (two) times daily., Disp: 60 tablet, Rfl: 2   hydroxypropyl methylcellulose / hypromellose (ISOPTO TEARS / GONIOVISC) 2.5 % ophthalmic solution, Place 2 drops into both eyes 2 (two) times daily. (Patient not taking: Reported on 05/28/2024), Disp: 15 mL, Rfl: 4   Incontinence Supply Disposable (PER-FIT UNDERWEAR EXTRA LARGE) MISC, USE AS DIRECTED TO WEAR OVERNIGHT FOR  URINARY INCONTINENCE, Disp: 28 each, Rfl: 10   zonisamide  (ZONEGRAN ) 50 MG capsule, Take 1 capsule (50 mg total) by mouth daily., Disp: 30 capsule, Rfl: 2 [2]  Allergies Allergen Reactions   Antihistamines, Diphenhydramine-Type Other (See Comments)    UNK reaction   Nsaids Other (See Comments)    Unk reaction   Prednisone Other (See Comments)    UNK reaction   Aspirin Rash   Penicillins Rash

## 2024-05-29 NOTE — ED Notes (Signed)
 Hospital meal provided, pt tolerated w/o complaints.  Waste discarded appropriately.

## 2024-05-29 NOTE — ED Notes (Signed)
 [facility] contacted and consent obtained

## 2024-05-29 NOTE — ED Provider Notes (Addendum)
" ° °  [facility] Observation Note   ----------------------------------------- 8:49 AM on 05/29/2024 -----------------------------------------  Eddie Berry is a 39 y.o. male currently in the emergency department.  Has been seen and evaluated by psychiatry and they will be pursuing inpatient psychiatric treatment for the patient.  No acute events since last update.  Recent Vitals   Most recent vital signs: Vitals:   05/28/24 1400 05/28/24 2135  BP: (!) 122/92 117/79  Pulse: (!) 103 92  Resp: 16 17  Temp: 97.8 F (36.6 C) 97.8 F (36.6 C)  SpO2: 97% 96%    ED Results / Procedures / Treatments   Labs (all labs ordered are listed, but only abnormal results are displayed) Labs Reviewed  COMPREHENSIVE METABOLIC PANEL WITH GFR - Abnormal; Notable for the following components:      Result Value   Sodium 146 (*)    Glucose, Bld 118 (*)    AST 69 (*)    All other components within normal limits  ETHANOL  CBC  URINE DRUG SCREEN  TSH  VALPROIC  ACID LEVEL    MEDICATIONS ORDERED IN ED: Medications  loratadine  (CLARITIN ) tablet 10 mg (has no administration in time range)  benztropine  (COGENTIN ) tablet 0.5 mg (0.5 mg Oral Given 05/28/24 2200)  cloZAPine  (CLOZARIL ) tablet 300 mg (has no administration in time range)  divalproex  (DEPAKOTE ) DR tablet 500 mg (500 mg Oral Given 05/28/24 2200)  docusate sodium  (COLACE) capsule 100 mg (has no administration in time range)  famotidine  (PEPCID ) tablet 20 mg (has no administration in time range)  fenofibrate  tablet 54 mg (has no administration in time range)  lacosamide  (VIMPAT ) tablet 200 mg (200 mg Oral Given 05/28/24 2200)  levothyroxine  (SYNTHROID ) tablet 25 mcg (has no administration in time range)  melatonin tablet 10 mg (10 mg Oral Given 05/28/24 2200)  paliperidone  (INVEGA ) 24 hr tablet 12 mg (has no administration in time range)  sertraline  (ZOLOFT ) tablet 150 mg (150 mg Oral Given 05/28/24 2200)   mirabegron  ER (MYRBETRIQ ) tablet 25 mg (has no administration in time range)  zonisamide  (ZONEGRAN ) capsule 25 mg (25 mg Oral Given 05/28/24 2202)  propranolol  (INDERAL ) tablet 10 mg (has no administration in time range)     ED Plan   Currently awaiting placement into an appropriate inpatient treatment facility for ongoing psychiatric needs.    Dorothyann Drivers, MD 05/29/24 [phone]    Patient has been seen and evaluated by psychiatry and's morning.  They believe the patient is safe for discharge back to his group facility from their standpoint.  Patient's medical workup has been negative/nonrevealing.  Will arrange transport back to the patient's group facility.    Dorothyann Drivers, MD 05/29/24 1119  "

## 2024-05-29 NOTE — BH Assessment (Signed)
 Per [facility]), patient to be referred out of system.  Referral information for Psychiatric Hospitalization faxed to;     [facility]  [phone]  [facility]  [phone]  [facility]  [phone]  [facility]  [phone]  [facility]  [phone]  [facility]  [phone]  [facility]  [phone]  [facility]  [phone]  [facility]  [phone]  [facility]  [phone]  [facility]  [phone]  [facility]  [phone]  [place] EFAX  [phone]  [facility]  [phone]  [facility]  [phone]

## 2024-05-29 NOTE — ED Notes (Signed)
 Clinical Director of Group Home: [phone]  RN spoke wit Clinical Director (CD). CD states I understand the policies and procedure but wanted to call and let you know that pt can come back today or whenever he is up for release. Pt is known for self seeking behavior and I truly do not believe he is a harm to himself. He often does this for attention. RN educated CD on provider recommendation. CD states to understand but wants to team to know the info regarding pt and hx.

## 2024-05-29 NOTE — Discharge Instructions (Addendum)
You have been seen in the emergency department for a  psychiatric concern. You have been evaluated both medically as well as psychiatrically. Please follow-up with your outpatient resources provided. Return to the emergency department for any worsening symptoms, or any thoughts of hurting yourself or anyone else so that we may attempt to help you. 

## 2024-05-29 NOTE — ED Notes (Signed)
 [facility] called multiple messages. Voicemail left. Director of [facility] called and states to be coming to pick pt up at this time.

## 2024-05-29 NOTE — ED Notes (Signed)
VOL/pending placement 

## 2024-06-01 ENCOUNTER — Other Ambulatory Visit: Payer: Self-pay | Admitting: Family Medicine

## 2024-06-01 DIAGNOSIS — J3089 Other allergic rhinitis: Secondary | ICD-10-CM

## 2024-06-02 MED ORDER — FAMOTIDINE 20 MG PO TABS: 20.0000 mg | ORAL_TABLET | Freq: Every day | ORAL | 10 refills | Status: AC

## 2024-06-02 MED ORDER — VITAMIN B-12 1000 MCG PO TABS: 1000.0000 ug | ORAL_TABLET | Freq: Every day | ORAL | 1 refills | Status: AC

## 2024-06-02 NOTE — Addendum Note (Signed)
 Addended by: TERREL POWELL CROME on: 06/02/2024 09:37 AM   Modules accepted: Orders

## 2024-06-21 ENCOUNTER — Ambulatory Visit: Admitting: Family Medicine

## 2024-06-24 ENCOUNTER — Ambulatory Visit: Admitting: Urology
